# Patient Record
Sex: Female | Born: 1950 | Hispanic: No | Smoking: Never smoker
Health system: Southern US, Community
[De-identification: ages and names within clinical notes are randomized; demographics above are authoritative.]

## PROBLEM LIST (undated history)

## (undated) DIAGNOSIS — J45909 Unspecified asthma, uncomplicated: Secondary | ICD-10-CM

## (undated) DIAGNOSIS — R1310 Dysphagia, unspecified: Secondary | ICD-10-CM

## (undated) DIAGNOSIS — L409 Psoriasis, unspecified: Secondary | ICD-10-CM

## (undated) DIAGNOSIS — Z8619 Personal history of other infectious and parasitic diseases: Secondary | ICD-10-CM

## (undated) DIAGNOSIS — K219 Gastro-esophageal reflux disease without esophagitis: Secondary | ICD-10-CM

## (undated) DIAGNOSIS — M8580 Other specified disorders of bone density and structure, unspecified site: Secondary | ICD-10-CM

## (undated) DIAGNOSIS — E785 Hyperlipidemia, unspecified: Secondary | ICD-10-CM

## (undated) DIAGNOSIS — T7840XA Allergy, unspecified, initial encounter: Secondary | ICD-10-CM

## (undated) DIAGNOSIS — E559 Vitamin D deficiency, unspecified: Secondary | ICD-10-CM

## (undated) DIAGNOSIS — N6019 Diffuse cystic mastopathy of unspecified breast: Secondary | ICD-10-CM

## (undated) DIAGNOSIS — K449 Diaphragmatic hernia without obstruction or gangrene: Secondary | ICD-10-CM

## (undated) HISTORY — PX: TUBAL LIGATION: SHX77

## (undated) HISTORY — DX: Psoriasis, unspecified: L40.9

## (undated) HISTORY — PX: COLONOSCOPY: SHX174

## (undated) HISTORY — DX: Allergy, unspecified, initial encounter: T78.40XA

## (undated) HISTORY — DX: Dysphagia, unspecified: R13.10

## (undated) HISTORY — DX: Unspecified asthma, uncomplicated: J45.909

## (undated) HISTORY — DX: Gastro-esophageal reflux disease without esophagitis: K21.9

## (undated) HISTORY — DX: Other specified disorders of bone density and structure, unspecified site: M85.80

## (undated) HISTORY — DX: Vitamin D deficiency, unspecified: E55.9

## (undated) HISTORY — PX: TONSILLECTOMY: SUR1361

## (undated) HISTORY — DX: Diffuse cystic mastopathy of unspecified breast: N60.19

## (undated) HISTORY — DX: Hyperlipidemia, unspecified: E78.5

## (undated) HISTORY — DX: Diaphragmatic hernia without obstruction or gangrene: K44.9

## (undated) HISTORY — PX: UPPER GASTROINTESTINAL ENDOSCOPY: SHX188

## (undated) HISTORY — DX: Personal history of other infectious and parasitic diseases: Z86.19

---

## 2001-01-09 ENCOUNTER — Other Ambulatory Visit: Admission: RE | Admit: 2001-01-09 | Discharge: 2001-01-09 | Payer: Self-pay | Admitting: Family Medicine

## 2001-10-20 ENCOUNTER — Other Ambulatory Visit: Admission: RE | Admit: 2001-10-20 | Discharge: 2001-10-20 | Payer: Self-pay | Admitting: Family Medicine

## 2001-12-07 ENCOUNTER — Encounter: Admission: RE | Admit: 2001-12-07 | Discharge: 2001-12-07 | Payer: Self-pay | Admitting: Gastroenterology

## 2001-12-07 ENCOUNTER — Encounter: Payer: Self-pay | Admitting: Gastroenterology

## 2003-11-17 ENCOUNTER — Ambulatory Visit (HOSPITAL_COMMUNITY): Admission: RE | Admit: 2003-11-17 | Discharge: 2003-11-17 | Payer: Self-pay | Admitting: Gastroenterology

## 2004-01-27 ENCOUNTER — Other Ambulatory Visit: Admission: RE | Admit: 2004-01-27 | Discharge: 2004-01-27 | Payer: Self-pay | Admitting: Family Medicine

## 2005-06-17 ENCOUNTER — Other Ambulatory Visit: Admission: RE | Admit: 2005-06-17 | Discharge: 2005-06-17 | Payer: Self-pay | Admitting: Family Medicine

## 2006-10-21 ENCOUNTER — Other Ambulatory Visit: Admission: RE | Admit: 2006-10-21 | Discharge: 2006-10-21 | Payer: Self-pay | Admitting: Family Medicine

## 2006-10-29 ENCOUNTER — Ambulatory Visit: Payer: Self-pay | Admitting: Gastroenterology

## 2006-11-17 ENCOUNTER — Ambulatory Visit: Payer: Self-pay | Admitting: Gastroenterology

## 2012-11-06 ENCOUNTER — Ambulatory Visit: Payer: Self-pay | Admitting: Nurse Practitioner

## 2012-11-10 ENCOUNTER — Encounter: Payer: Self-pay | Admitting: Nurse Practitioner

## 2012-11-10 ENCOUNTER — Ambulatory Visit (INDEPENDENT_AMBULATORY_CARE_PROVIDER_SITE_OTHER): Payer: BC Managed Care – PPO | Admitting: Nurse Practitioner

## 2012-11-10 VITALS — BP 141/82 | HR 66 | Temp 98.1°F | Ht 62.5 in | Wt 197.0 lb

## 2012-11-10 DIAGNOSIS — R002 Palpitations: Secondary | ICD-10-CM

## 2012-11-10 DIAGNOSIS — Z01419 Encounter for gynecological examination (general) (routine) without abnormal findings: Secondary | ICD-10-CM

## 2012-11-10 DIAGNOSIS — L409 Psoriasis, unspecified: Secondary | ICD-10-CM | POA: Insufficient documentation

## 2012-11-10 DIAGNOSIS — Z0000 Encounter for general adult medical examination without abnormal findings: Secondary | ICD-10-CM

## 2012-11-10 LAB — POCT CBC
Granulocyte percent: 61.6 %G (ref 37–80)
HCT, POC: 40.6 % (ref 37.7–47.9)
Hemoglobin: 13.4 g/dL (ref 12.2–16.2)
Lymph, poc: 1.8 (ref 0.6–3.4)
MCH, POC: 27 pg (ref 27–31.2)
MCHC: 32.9 g/dL (ref 31.8–35.4)
MCV: 82.1 fL (ref 80–97)
MPV: 7.2 fL (ref 0–99.8)
POC Granulocyte: 3.6 (ref 2–6.9)
POC LYMPH PERCENT: 31.6 %L (ref 10–50)
Platelet Count, POC: 335 10*3/uL (ref 142–424)
RBC: 4.9 M/uL (ref 4.04–5.48)
RDW, POC: 13.6 %
WBC: 5.8 10*3/uL (ref 4.6–10.2)

## 2012-11-10 LAB — POCT URINALYSIS DIPSTICK
Bilirubin, UA: NEGATIVE
Glucose, UA: NEGATIVE
Ketones, UA: NEGATIVE
Leukocytes, UA: NEGATIVE
Nitrite, UA: NEGATIVE
Protein, UA: NEGATIVE
Spec Grav, UA: 1.02
Urobilinogen, UA: NEGATIVE
pH, UA: 5

## 2012-11-10 LAB — COMPLETE METABOLIC PANEL WITH GFR
ALT: 53 U/L — ABNORMAL HIGH (ref 0–35)
AST: 37 U/L (ref 0–37)
Albumin: 4.4 g/dL (ref 3.5–5.2)
Alkaline Phosphatase: 120 U/L — ABNORMAL HIGH (ref 39–117)
BUN: 8 mg/dL (ref 6–23)
CO2: 26 mEq/L (ref 19–32)
Calcium: 9.6 mg/dL (ref 8.4–10.5)
Chloride: 104 mEq/L (ref 96–112)
Creat: 0.57 mg/dL (ref 0.50–1.10)
GFR, Est African American: >89 mL/min
GFR, Est Non African American: >89 mL/min
Glucose, Bld: 92 mg/dL (ref 70–99)
Potassium: 4.2 mEq/L (ref 3.5–5.3)
Sodium: 140 mEq/L (ref 135–145)
Total Bilirubin: 0.4 mg/dL (ref 0.3–1.2)
Total Protein: 7.2 g/dL (ref 6.0–8.3)

## 2012-11-10 LAB — POCT UA - MICROSCOPIC ONLY
Crystals, Ur, HPF, POC: NEGATIVE
Yeast, UA: NEGATIVE

## 2012-11-10 LAB — THYROID PANEL WITH TSH: T4, Total: 9.1 ug/dL (ref 5.0–12.5)

## 2012-11-10 MED ORDER — PANTOPRAZOLE SODIUM 40 MG PO TBEC: 40.0000 mg | DELAYED_RELEASE_TABLET | Freq: Every day | ORAL | Status: DC

## 2012-11-10 MED ORDER — CLOBETASOL PROPIONATE 0.05 % EX FOAM: Freq: Two times a day (BID) | CUTANEOUS | Status: DC

## 2012-11-10 NOTE — Addendum Note (Signed)
Addended by: Bennie Pierini on: 11/10/2012 05:25 PM   Modules accepted: Orders, Medications

## 2012-11-10 NOTE — Progress Notes (Signed)
Subjective:    Patient ID: Angela Curry, female    DOB: 03/20/1951, 62 y.o.   MRN: [id number]  HPI  Patient here today for CPE and PAP. She is doing well.  * Patient c/o more frquent palpitations lasting 15-30 seconds at most. Occur 2-3 X per week. Some SOB but only when occurring.   Strong Family Hx of heart disease. No Known Allergies  Outpatient Encounter Prescriptions as of 11/10/2012  Medication Sig Dispense Refill  . clobetasol (OLUX) 0.05 % topical foam Apply topically 2 (two) times daily.      Marland Kitchen lubiprostone (AMITIZA) 24 MCG capsule Take 24 mcg by mouth 2 (two) times daily with a meal.      . RABEprazole (ACIPHEX) 20 MG tablet Take 20 mg by mouth daily.      . valACYclovir (VALTREX) 1000 MG tablet Take 1,000 mg by mouth 2 (two) times daily.       No facility-administered encounter medications on file as of 11/10/2012.    Past Medical History  Diagnosis Date  . Psoriasis   . Osteopenia   . Fibrocystic breast disease   . H/O cold sores   . Hiatal hernia   . Dysphagia   . Vitamin D deficiency     Past Surgical History  Procedure Laterality Date  . Tubal ligation    . Tonsillectomy      History   Social History  . Marital Status: Divorced    Spouse Name: N/A    Number of Children: N/A  . Years of Education: N/A   Occupational History  . Not on file.   Social History Main Topics  . Smoking status: Never Smoker   . Smokeless tobacco: Not on file  . Alcohol Use: Yes  . Drug Use: No  . Sexually Active: Not on file   Other Topics Concern  . Not on file   Social History Narrative  . No narrative on file         Review of Systems  Constitutional: Negative.   HENT: Negative.   Eyes: Negative.   Respiratory: Negative.   Cardiovascular: Negative.   Gastrointestinal: Negative.   Endocrine: Negative.   Genitourinary: Negative.   Musculoskeletal: Negative.   Allergic/Immunologic: Negative.   Neurological: Negative.   Hematological: Negative.    Psychiatric/Behavioral: Negative.        Objective:   Physical Exam  Constitutional: She is oriented to person, place, and time. She appears well-developed and well-nourished.  HENT:  Head: Normocephalic.  Right Ear: Hearing, tympanic membrane, external ear and ear canal normal.  Left Ear: Hearing, tympanic membrane, external ear and ear canal normal.  Nose: Nose normal.  Mouth/Throat: Uvula is midline and oropharynx is clear and moist.  Eyes: Conjunctivae and EOM are normal. Pupils are equal, round, and reactive to light.  Neck: Normal range of motion and full passive range of motion without pain. Neck supple. No JVD present. Carotid bruit is not present. No mass and no thyromegaly present.  Cardiovascular: Normal rate, normal heart sounds and intact distal pulses.   No murmur heard. Pulmonary/Chest: Effort normal and breath sounds normal. Right breast exhibits no inverted nipple, no mass, no nipple discharge, no skin change and no tenderness. Left breast exhibits no inverted nipple, no mass, no nipple discharge, no skin change and no tenderness.  Abdominal: Soft. Bowel sounds are normal. She exhibits no mass. There is no tenderness.  Genitourinary: Vagina normal and uterus normal. No breast swelling, tenderness, discharge or bleeding.  bimanual exam-No adnexal masses or tenderness. Cervix parous and pink no discharge   Musculoskeletal: Normal range of motion.  Lymphadenopathy:    She has no cervical adenopathy.  Neurological: She is alert and oriented to person, place, and time.  Skin: Skin is warm and dry.  Psychiatric: She has a normal mood and affect. Her behavior is normal. Judgment and thought content normal.   BP 141/82  Pulse 66  Temp(Src) 98.1 F (36.7 C) (Oral)  Ht 5' 2.5" (1.588 m)  Wt 197 lb (89.359 kg)  BMI 35.44 kg/m2        Assessment & Plan:  1. Annual physical exam  - POCT urinalysis dipstick - POCT UA - Microscopic Only - POCT CBC - COMPLETE  METABOLIC PANEL WITH GFR - NMR Lipoprofile with Lipids - Thyroid Panel With TSH  2. Psoriasis   3. Encounter for routine gynecological examination  - Pap IG, CT/NG w/ reflex HPV when ASC-U  4. Palpitations Avoid caffeine - Ambulatory referral to Cardiology  Mary-Margaret Daphine Deutscher, FNP

## 2012-11-10 NOTE — Patient Instructions (Signed)
Palpitations  A palpitation is the feeling that your heartbeat is irregular or is faster than normal. It may feel like your heart is fluttering or skipping a beat. Palpitations are usually not a serious problem. However, in some cases, you may need further medical evaluation. CAUSES  Palpitations can be caused by:  Smoking.  Caffeine or other stimulants, such as diet pills or energy drinks.  Alcohol.  Stress and anxiety.  Strenuous physical activity.  Fatigue.  Certain medicines.  Heart disease, especially if you have a history of arrhythmias. This includes atrial fibrillation, atrial flutter, or supraventricular tachycardia.  An improperly working pacemaker or defibrillator. DIAGNOSIS  To find the cause of your palpitations, your caregiver will take your history and perform a physical exam. Tests may also be done, including:  Electrocardiography (ECG). This test records the heart's electrical activity.  Cardiac monitoring. This allows your caregiver to monitor your heart rate and rhythm in real time.  Holter monitor. This is a portable device that records your heartbeat and can help diagnose heart arrhythmias. It allows your caregiver to track your heart activity for several days, if needed.  Stress tests by exercise or by giving medicine that makes the heart beat faster. TREATMENT  Treatment of palpitations depends on the cause of your symptoms and can vary greatly. Most cases of palpitations do not require any treatment other than time, relaxation, and monitoring your symptoms. Other causes, such as atrial fibrillation, atrial flutter, or supraventricular tachycardia, usually require further treatment. HOME CARE INSTRUCTIONS   Avoid:  Caffeinated coffee, tea, soft drinks, diet pills, and energy drinks.  Chocolate.  Alcohol.  Stop smoking if you smoke.  Reduce your stress and anxiety. Things that can help you relax include:  A method that measures bodily functions so  you can learn to control them (biofeedback).  Yoga.  Meditation.  Physical activity such as swimming, jogging, or walking.  Get plenty of rest and sleep. SEEK MEDICAL CARE IF:   You continue to have a fast or irregular heartbeat beyond 24 hours.  Your palpitations occur more often. SEEK IMMEDIATE MEDICAL CARE IF:  You develop chest pain or shortness of breath.  You have a severe headache.  You feel dizzy, or you faint. MAKE SURE YOU:  Understand these instructions.  Will watch your condition.  Will get help right away if you are not doing well or get worse. Document Released: 06/28/2000 Document Revised: 12/31/2011 Document Reviewed: 08/30/2011 ExitCare Patient Information 2013 [employer].  

## 2012-11-11 ENCOUNTER — Ambulatory Visit (INDEPENDENT_AMBULATORY_CARE_PROVIDER_SITE_OTHER): Payer: BC Managed Care – PPO | Admitting: Family Medicine

## 2012-11-11 DIAGNOSIS — Z2911 Encounter for prophylactic immunotherapy for respiratory syncytial virus (RSV): Secondary | ICD-10-CM

## 2012-11-11 DIAGNOSIS — Z299 Encounter for prophylactic measures, unspecified: Secondary | ICD-10-CM

## 2012-11-11 LAB — NMR LIPOPROFILE WITH LIPIDS
HDL Size: 8.8 nm — ABNORMAL LOW (ref >=9.2)
HDL-C: 54 mg/dL (ref >=40)
LDL Particle Number: 2501 nmol/L — ABNORMAL HIGH (ref <1000)
LDL Size: 20.8 nm (ref >20.5)
Large HDL-P: 5 umol/L (ref >=4.8)
VLDL Size: 46.3 nm (ref <=46.6)

## 2012-11-11 NOTE — Patient Instructions (Signed)
Shingles Vaccine What You Need to Know WHAT IS SHINGLES?  Shingles is a painful skin rash, often with blisters. It is also called Herpes Zoster or just Zoster.  A shingles rash usually appears on one side of the face or body and lasts from 2 to 4 weeks. Its main symptom is pain, which can be quite severe. Other symptoms of shingles can include fever, headache, chills, and upset stomach. Very rarely, a shingles infection can lead to pneumonia, hearing problems, blindness, brain inflammation (encephalitis), or death.  For about 1 person in 5, severe pain can continue even after the rash clears up. This is called post-herpetic neuralgia.  Shingles is caused by the Varicella Zoster virus. This is the same virus that causes chickenpox. Only someone who has had a case of chickenpox or rarely, has gotten chickenpox vaccine, can get shingles. The virus stays in your body. It can reappear many years later to cause a case of shingles.  You cannot catch shingles from another person with shingles. However, a person who has never had chickenpox (or chickenpox vaccine) could get chickenpox from someone with shingles. This is not very common.  Shingles is far more common in people 50 and older than in younger people. It is also more common in people whose immune systems are weakened because of a disease such as cancer or drugs such as steroids or chemotherapy.  At least 1 million people get shingles per year in the [place]. SHINGLES VACCINE  A vaccine for shingles was licensed in 2006. In clinical trials, the vaccine reduced the risk of shingles by 50%. It can also reduce the pain in people who still get shingles after being vaccinated.  A single dose of shingles vaccine is recommended for adults 60 years of age and older. SOME PEOPLE SHOULD NOT GET SHINGLES VACCINE OR SHOULD WAIT A person should not get shingles vaccine if he or she:  Has ever had a life-threatening allergic reaction to gelatin, the  antibiotic neomycin, or any other component of shingles vaccine. Tell your caregiver if you have any severe allergies.  Has a weakened immune system because of current:  AIDS or another disease that affects the immune system.  Treatment with drugs that affect the immune system, such as prolonged use of high-dose steroids.  Cancer treatment, such as radiation or chemotherapy.  Cancer affecting the bone marrow or lymphatic system, such as leukemia or lymphoma.  Is pregnant, or might be pregnant. Women should not become pregnant until at least 4 weeks after getting shingles vaccine. Someone with a minor illness, such as a cold, may be vaccinated. Anyone with a moderate or severe acute illness should usually wait until he or she recovers before getting the vaccine. This includes anyone with a temperature of 101.3 F (38 C) or higher. WHAT ARE THE RISKS FROM SHINGLES VACCINE?  A vaccine, like any medicine, could possibly cause serious problems, such as severe allergic reactions. However, the risk of a vaccine causing serious harm, or death, is extremely small.  No serious problems have been identified with shingles vaccine. Mild Problems  Redness, soreness, swelling, or itching at the site of the injection (about 1 person in 3).  Headache (about 1 person in 70). Like all vaccines, shingles vaccine is being closely monitored for unusual or severe problems. WHAT IF THERE IS A MODERATE OR SEVERE REACTION? What should I look for? Any unusual condition, such as a severe allergic reaction or a high fever. If a severe allergic reaction   occurred, it would be within a few minutes to an hour after the shot. Signs of a serious allergic reaction can include difficulty breathing, weakness, hoarseness or wheezing, a fast heartbeat, hives, dizziness, paleness, or swelling of the throat. What should I do?  Call your caregiver, or get the person to a caregiver right away.  Tell the caregiver what  happened, the date and time it happened, and when the vaccination was given.  Ask the caregiver to report the reaction by filing a Vaccine Adverse Event Reporting System (VAERS) form. Or, you can file this report through the VAERS web site at www.vaers.hhs.gov or by calling 1-800-822-7967. VAERS does not provide medical advice. HOW CAN I LEARN MORE?  Ask your caregiver. He or she can give you the vaccine package insert or suggest other sources of information.  Contact the Centers for Disease Control and Prevention (CDC):  Call 1-800-232-4636 (1-800-CDC-INFO).  Visit the CDC website at www.cdc.gov/vaccines CDC Shingles Vaccine VIS (04/19/08) Document Released: 04/28/2006 Document Revised: 09/23/2011 Document Reviewed: 04/19/2008 ExitCare Patient Information 2013 [employer].  

## 2012-11-11 NOTE — Progress Notes (Deleted)
Subjective:     Patient ID: Angela Curry, female   DOB: 10/13/1950, 62 y.o.   MRN: [id number]  HPI   Review of Systems     Objective:   Physical Exam     Assessment:     ***    Plan:     ***     PT GIVEN ZOSTAVAX AND TOLERATED WELL

## 2012-11-12 NOTE — Progress Notes (Signed)
Pt given zostavax and tolerated well.

## 2012-11-12 NOTE — Addendum Note (Signed)
Addended by: Orma Render F on: 11/12/2012 09:06 AM   Modules accepted: Orders

## 2012-11-13 LAB — PAP, THINPREP RFLX HPV WITH CT/GC
Chlamydia Probe Amp: NEGATIVE
GC Probe Amp: NEGATIVE

## 2012-12-08 ENCOUNTER — Ambulatory Visit: Payer: Self-pay | Admitting: Cardiovascular Disease

## 2012-12-30 ENCOUNTER — Encounter: Payer: Self-pay | Admitting: Cardiovascular Disease

## 2012-12-30 ENCOUNTER — Encounter: Payer: Self-pay | Admitting: *Deleted

## 2012-12-30 DIAGNOSIS — M8580 Other specified disorders of bone density and structure, unspecified site: Secondary | ICD-10-CM | POA: Insufficient documentation

## 2012-12-30 DIAGNOSIS — N6019 Diffuse cystic mastopathy of unspecified breast: Secondary | ICD-10-CM | POA: Insufficient documentation

## 2012-12-30 DIAGNOSIS — E559 Vitamin D deficiency, unspecified: Secondary | ICD-10-CM | POA: Insufficient documentation

## 2012-12-30 DIAGNOSIS — K449 Diaphragmatic hernia without obstruction or gangrene: Secondary | ICD-10-CM | POA: Insufficient documentation

## 2012-12-30 DIAGNOSIS — R1310 Dysphagia, unspecified: Secondary | ICD-10-CM | POA: Insufficient documentation

## 2013-01-04 ENCOUNTER — Ambulatory Visit (INDEPENDENT_AMBULATORY_CARE_PROVIDER_SITE_OTHER): Payer: Self-pay | Admitting: Cardiovascular Disease

## 2013-01-04 ENCOUNTER — Encounter: Payer: Self-pay | Admitting: Family Medicine

## 2013-01-04 ENCOUNTER — Telehealth: Payer: Self-pay | Admitting: Nurse Practitioner

## 2013-01-04 ENCOUNTER — Ambulatory Visit (INDEPENDENT_AMBULATORY_CARE_PROVIDER_SITE_OTHER): Payer: BC Managed Care – PPO | Admitting: Family Medicine

## 2013-01-04 ENCOUNTER — Encounter: Payer: Self-pay | Admitting: Cardiovascular Disease

## 2013-01-04 VITALS — BP 128/88 | HR 85 | Ht 62.5 in | Wt 196.1 lb

## 2013-01-04 VITALS — BP 139/90 | HR 78 | Temp 98.7°F | Ht 62.5 in | Wt 198.7 lb

## 2013-01-04 DIAGNOSIS — R0989 Other specified symptoms and signs involving the circulatory and respiratory systems: Secondary | ICD-10-CM

## 2013-01-04 DIAGNOSIS — R609 Edema, unspecified: Secondary | ICD-10-CM

## 2013-01-04 DIAGNOSIS — M549 Dorsalgia, unspecified: Secondary | ICD-10-CM

## 2013-01-04 DIAGNOSIS — R002 Palpitations: Secondary | ICD-10-CM

## 2013-01-04 DIAGNOSIS — E785 Hyperlipidemia, unspecified: Secondary | ICD-10-CM | POA: Insufficient documentation

## 2013-01-04 DIAGNOSIS — R0609 Other forms of dyspnea: Secondary | ICD-10-CM

## 2013-01-04 DIAGNOSIS — E7800 Pure hypercholesterolemia, unspecified: Secondary | ICD-10-CM

## 2013-01-04 DIAGNOSIS — R0600 Dyspnea, unspecified: Secondary | ICD-10-CM

## 2013-01-04 DIAGNOSIS — K219 Gastro-esophageal reflux disease without esophagitis: Secondary | ICD-10-CM

## 2013-01-04 MED ORDER — METHYLPREDNISOLONE 4 MG PO KIT: PACK | ORAL | Status: DC

## 2013-01-04 MED ORDER — HYDROCODONE-ACETAMINOPHEN 5-325 MG PO TABS: 1.0000 | ORAL_TABLET | Freq: Four times a day (QID) | ORAL | Status: DC | PRN

## 2013-01-04 MED ORDER — NAPROXEN 500 MG PO TABS: 500.0000 mg | ORAL_TABLET | Freq: Two times a day (BID) | ORAL | Status: DC

## 2013-01-04 NOTE — Assessment & Plan Note (Signed)
Low sodium diet Likely venous disease with varicosities She does not seem bothered by it

## 2013-01-04 NOTE — Assessment & Plan Note (Signed)
Benign sounding and improved since visit with primary Given demographic and CRF;s will order stress echo to r/o exercise induced arrhythmia and CAD.  Echo imaging for mild exertional dyspnea to r/o structural heart disease

## 2013-01-04 NOTE — Telephone Encounter (Signed)
Ppt made.  

## 2013-01-04 NOTE — Assessment & Plan Note (Signed)
See note by primary Recommend calcium score for risk statification and to further see how close cholesterol and particle size needs to be watched and Rx

## 2013-01-04 NOTE — Progress Notes (Signed)
  Subjective:    Patient ID: Angela Curry, female    DOB: Jan 28, 1951, 62 y.o.   MRN: [id number]  HPI This 62 y.o. female presents for evaluation of back pain.  She states she twisted her back yesterday and now has severe pain in her right back radiating down he right leg to her knee.  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  She states she has never injured her back and wants to get out of pain.   Review of Systems   C/o back, No chest pain, SOB, HA, dizziness, vision change, N/V, diarrhea, constipation, dysuria, urinary urgency or frequency, myalgias, or rash.  Objective:   Physical Exam Vital signs noted  Well developed well nourished female.  HEENT - Head atraumatic Normocephalic                Eyes - PERRLA, Conjuctiva - clear Sclera- Clear EOMI                Ears - EAC's Wnl TM's Wnl Gross Hearing WNL                Nose - Nares patent                 Throat - oropharanx wnl Respiratory - Lungs CTA bilateral Cardiac - RRR S1 and S2 without murmur MS- TTP right LS spine. Extremities - No edema. Neuro - Grossly intact.       Assessment & Plan:  Back pain - Plan: naproxen (NAPROSYN) 500 MG tablet, HDROcodone-acetaminophen (NORCO) 5-325 MG per tablet, methylPREDNISolone (MEDROL, PAK,) 4 MG tablet,  Work note, and follow up in one week if not better.  Instruction for home are to be in position of comfort and also warned patient of possible GI side effects of naprosyn and medrol dose pack.    GERD - Continue Aciphex 20mg  and if GERD sx's worsen increase to BID and consider using zantac while using naprosyn and medrol dose pack.

## 2013-01-04 NOTE — Progress Notes (Signed)
Patient ID: Angela Curry, female   DOB: 01-07-1951, 62 y.o.   MRN: [id number] Referred by [facility] for palpitations Patient c/o more frquent palpitations lasting 15-30 seconds at most. Occur 2-3 X per week. Some SOB but only when occurring. Strong Family Hx of heart disease. TSH normal 4/14  CRF other than family history is high LDL particle size Rx with diet  She is a full blooded [employer] [place] and there is a high prevalence of CAD int this demographic Palpitations have improved over the last 6 weeks. They were not exertional. Flip flops and no rapic sustatined rhythm.  Fairly sedentary with mild exertional dyspnea. Has LE varicosities with trace edema  ROS: Denies fever, malais, weight loss, blurry vision, decreased visual acuity, cough, sputum, SOB, hemoptysis, pleuritic pain, palpitaitons, heartburn, abdominal pain, melena, lower extremity edema, claudication, or rash.  All other systems reviewed and negative   General: Affect appropriate Overweight white female HEENT: normal Roasacea Neck supple with no adenopathy JVP normal no bruits no thyromegaly Lungs clear with no wheezing and good diaphragmatic motion Heart:  S1/S2 no murmur,rub, gallop or click PMI normal Abdomen: benighn, BS positve, no tenderness, no AAA no bruit.  No HSM or HJR Distal pulses intact with no bruits Trace bilateral  Edema with varicose veins Neuro non-focal Skin warm and dry No muscular weakness  Medications Current Outpatient Prescriptions  Medication Sig Dispense Refill  . clobetasol (OLUX) 0.05 % topical foam Apply topically 2 (two) times daily.  50 g  2  . lubiprostone (AMITIZA) 24 MCG capsule Take 24 mcg by mouth 2 (two) times daily with a meal.      . RABEprazole (ACIPHEX) 20 MG tablet Take 20 mg by mouth daily.      . valACYclovir (VALTREX) 1000 MG tablet Take 1,000 mg by mouth 2 (two) times daily.       No current facility-administered medications for this visit.    Allergies Review of  patient's allergies indicates no known allergies.  Family History: Family History  Problem Relation Age of Onset  . Diabetes Mother   . Heart disease Father 52  . Drug abuse Father   . Early death Father   . Cancer Paternal Aunt     breast  . Cancer Paternal Aunt     breast    Social History: History   Social History  . Marital Status: Divorced    Spouse Name: N/A    Number of Children: N/A  . Years of Education: N/A   Occupational History  . Not on file.   Social History Main Topics  . Smoking status: Never Smoker   . Smokeless tobacco: Not on file  . Alcohol Use: Yes  . Drug Use: No  . Sexually Active: Not on file   Other Topics Concern  . Not on file   Social History Narrative  . No narrative on file    Electrocardiogram:NSR rate 85 normal ECG  Assessment and Plan

## 2013-01-04 NOTE — Patient Instructions (Signed)
Back Pain, Adult  Low back pain is very common. About 1 in 5 people have back pain. The cause of low back pain is rarely dangerous. The pain often gets better over time. About half of people with a sudden onset of back pain feel better in just 2 weeks. About 8 in 10 people feel better by 6 weeks.   CAUSES  Some common causes of back pain include:  · Strain of the muscles or ligaments supporting the spine.  · Wear and tear (degeneration) of the spinal discs.  · Arthritis.  · Direct injury to the back.  DIAGNOSIS  Most of the time, the direct cause of low back pain is not known. However, back pain can be treated effectively even when the exact cause of the pain is unknown. Answering your caregiver's questions about your overall health and symptoms is one of the most accurate ways to make sure the cause of your pain is not dangerous. If your caregiver needs more information, he or she may order lab work or imaging tests (X-rays or MRIs). However, even if imaging tests show changes in your back, this usually does not require surgery.  HOME CARE INSTRUCTIONS  For many people, back pain returns. Since low back pain is rarely dangerous, it is often a condition that people can learn to manage on their own.   · Remain active. It is stressful on the back to sit or stand in one place. Do not sit, drive, or stand in one place for more than 30 minutes at a time. Take short walks on level surfaces as soon as pain allows. Try to increase the length of time you walk each day.  · Do not stay in bed. Resting more than 1 or 2 days can delay your recovery.  · Do not avoid exercise or work. Your body is made to move. It is not dangerous to be active, even though your back may hurt. Your back will likely heal faster if you return to being active before your pain is gone.  · Pay attention to your body when you  bend and lift. Many people have less discomfort when lifting if they bend their knees, keep the load close to their bodies, and  avoid twisting. Often, the most comfortable positions are those that put less stress on your recovering back.  · Find a comfortable position to sleep. Use a firm mattress and lie on your side with your knees slightly bent. If you lie on your back, put a pillow under your knees.  · Only take over-the-counter or prescription medicines as directed by your caregiver. Over-the-counter medicines to reduce pain and inflammation are often the most helpful. Your caregiver may prescribe muscle relaxant drugs. These medicines help dull your pain so you can more quickly return to your normal activities and healthy exercise.  · Put ice on the injured area.  · Put ice in a plastic bag.  · Place a towel between your skin and the bag.  · Leave the ice on for 15-20 minutes, 3-4 times a day for the first 2 to 3 days. After that, ice and heat may be alternated to reduce pain and spasms.  · Ask your caregiver about trying back exercises and gentle massage. This may be of some benefit.  · Avoid feeling anxious or stressed. Stress increases muscle tension and can worsen back pain. It is important to recognize when you are anxious or stressed and learn ways to manage it. Exercise is a great option.  SEEK MEDICAL CARE IF:  · You have pain that is not relieved with rest or   medicine.  · You have pain that does not improve in 1 week.  · You have new symptoms.  · You are generally not feeling well.  SEEK IMMEDIATE MEDICAL CARE IF:   · You have pain that radiates from your back into your legs.  · You develop new bowel or bladder control problems.  · You have unusual weakness or numbness in your arms or legs.  · You develop nausea or vomiting.  · You develop abdominal pain.  · You feel faint.  Document Released: 07/01/2005 Document Revised: 12/31/2011 Document Reviewed: 11/19/2010  ExitCare® Patient Information ©2014 [employer].

## 2013-01-04 NOTE — Patient Instructions (Addendum)
Your physician recommends that you schedule a follow-up appointment in: AS NEEDED  Your physician recommends that you continue on your current medications as directed. Please refer to the Current Medication list given to you today.  Your physician has requested that you have a stress echocardiogram. For further information please visit [email or web address] Please follow instruction sheet as given.  CALCIUM SCORE MAY CALL  BACK TO SCHEDULE  OUT OF POCKET EXPENSE OF A 150.OO   ALSO  MAY CALL BACK IF  HAS MORE PALPITATIONS  TO GET MONITOR

## 2013-01-08 ENCOUNTER — Other Ambulatory Visit: Payer: Self-pay | Admitting: *Deleted

## 2013-01-08 MED ORDER — RABEPRAZOLE SODIUM 20 MG PO TBEC: 20.0000 mg | DELAYED_RELEASE_TABLET | Freq: Every day | ORAL | Status: DC

## 2013-01-26 ENCOUNTER — Other Ambulatory Visit (HOSPITAL_COMMUNITY): Payer: BC Managed Care – PPO

## 2013-02-17 ENCOUNTER — Other Ambulatory Visit (HOSPITAL_COMMUNITY): Payer: BC Managed Care – PPO

## 2013-02-22 ENCOUNTER — Other Ambulatory Visit: Payer: Self-pay | Admitting: Nurse Practitioner

## 2013-02-23 ENCOUNTER — Ambulatory Visit (HOSPITAL_COMMUNITY): Payer: BC Managed Care – PPO | Attending: Cardiovascular Disease

## 2013-02-23 ENCOUNTER — Other Ambulatory Visit: Payer: Self-pay | Admitting: *Deleted

## 2013-02-23 DIAGNOSIS — R0602 Shortness of breath: Secondary | ICD-10-CM

## 2013-02-23 DIAGNOSIS — R002 Palpitations: Secondary | ICD-10-CM | POA: Insufficient documentation

## 2013-02-23 DIAGNOSIS — R0989 Other specified symptoms and signs involving the circulatory and respiratory systems: Secondary | ICD-10-CM | POA: Insufficient documentation

## 2013-02-23 DIAGNOSIS — Z8249 Family history of ischemic heart disease and other diseases of the circulatory system: Secondary | ICD-10-CM | POA: Insufficient documentation

## 2013-02-23 DIAGNOSIS — R0609 Other forms of dyspnea: Secondary | ICD-10-CM | POA: Insufficient documentation

## 2013-02-23 DIAGNOSIS — R0600 Dyspnea, unspecified: Secondary | ICD-10-CM

## 2013-02-23 DIAGNOSIS — R609 Edema, unspecified: Secondary | ICD-10-CM | POA: Insufficient documentation

## 2013-02-23 NOTE — Progress Notes (Signed)
Echocardiogram performed.  

## 2013-02-23 NOTE — Progress Notes (Signed)
Stress echocardiogram performed. 

## 2013-02-25 ENCOUNTER — Telehealth: Payer: Self-pay | Admitting: Cardiovascular Disease

## 2013-02-25 ENCOUNTER — Encounter: Payer: Self-pay | Admitting: Cardiovascular Disease

## 2013-02-25 NOTE — Telephone Encounter (Signed)
Follow up  Pt is returning call regarding echo results.

## 2013-02-25 NOTE — Progress Notes (Signed)
Quick Note:  Provided results to patient by phone. Patient verbalized understanding and appreciation. ______ 

## 2013-02-25 NOTE — Telephone Encounter (Signed)
Provided results to patient by phone. Patient verbalized understanding and appreciation.

## 2013-02-25 NOTE — Progress Notes (Signed)
Quick Note:  Provided results to patient by phone. Patient verbalized understanding and appreciation. ______

## 2013-03-02 ENCOUNTER — Telehealth: Payer: Self-pay | Admitting: Nurse Practitioner

## 2013-03-03 ENCOUNTER — Encounter: Payer: Self-pay | Admitting: General Practice

## 2013-03-03 ENCOUNTER — Ambulatory Visit (INDEPENDENT_AMBULATORY_CARE_PROVIDER_SITE_OTHER): Payer: BC Managed Care – PPO

## 2013-03-03 ENCOUNTER — Ambulatory Visit (INDEPENDENT_AMBULATORY_CARE_PROVIDER_SITE_OTHER): Payer: BC Managed Care – PPO | Admitting: General Practice

## 2013-03-03 VITALS — BP 129/88 | HR 90 | Temp 97.8°F | Ht 62.5 in | Wt 191.0 lb

## 2013-03-03 DIAGNOSIS — R509 Fever, unspecified: Secondary | ICD-10-CM

## 2013-03-03 DIAGNOSIS — R109 Unspecified abdominal pain: Secondary | ICD-10-CM

## 2013-03-03 LAB — POCT CBC
Granulocyte percent: 75.9 %G (ref 37–80)
HCT, POC: 41.6 % (ref 37.7–47.9)
Hemoglobin: 13.5 g/dL (ref 12.2–16.2)
MPV: 7.5 fL (ref 0–99.8)
POC Granulocyte: 7.7 — AB (ref 2–6.9)
POC LYMPH PERCENT: 19.1 %L (ref 10–50)
RBC: 5 M/uL (ref 4.04–5.48)

## 2013-03-03 NOTE — Telephone Encounter (Signed)
Please follow up

## 2013-03-03 NOTE — Progress Notes (Signed)
  Subjective:    Patient ID: Angela Curry, female    DOB: 1950/09/22, 62 y.o.   MRN: [id number]  Constipation This is a new problem. The current episode started in the past 7 days (last bowel movement on Sunday). The problem has been gradually worsening since onset. The patient is not on a high fiber diet. She does not exercise regularly. There has not been adequate water intake. Associated symptoms include abdominal pain, bloating and vomiting. Pertinent negatives include no back pain, difficulty urinating, fever or hematochezia. She has tried laxatives for the symptoms.      Review of Systems  Constitutional: Negative for fever and chills.  Respiratory: Negative for chest tightness and shortness of breath.   Cardiovascular: Negative for chest pain and palpitations.  Gastrointestinal: Positive for vomiting, abdominal pain, constipation and bloating. Negative for blood in stool and hematochezia.  Genitourinary: Negative for dysuria and difficulty urinating.  Musculoskeletal: Negative for back pain.  Neurological: Negative for dizziness, weakness and headaches.       Objective:   Physical Exam  Constitutional: She is oriented to person, place, and time. She appears well-developed and well-nourished.  Cardiovascular: Normal rate, regular rhythm and normal heart sounds.   Pulmonary/Chest: Effort normal and breath sounds normal. No respiratory distress. She exhibits no tenderness.  Abdominal: Soft. Bowel sounds are normal. She exhibits no distension. There is tenderness in the right lower quadrant and left lower quadrant. There is no CVA tenderness. No hernia.  Neurological: She is alert and oriented to person, place, and time.  Skin: Skin is warm and dry.  Psychiatric: She has a normal mood and affect.          Assessment & Plan:  1. Fever - POCT CBC  2. Abdominal  pain, other specified site - POCT CBC - DG Abd 1 View; Future -Miralax 17grams daily, for 1-4 days, until  bowel movement  Increase fluid intake (water) Increase fiber in diet (fruits, vegetables, whole grains) Take stool softner daily RTO if symptoms worsen, no bowel movement in 24 hours, or seek emergency medical treatment Patient verbalized understanding Coralie Keens, FNP-C

## 2013-03-03 NOTE — Patient Instructions (Addendum)
Abdominal Pain Abdominal pain can be caused by many things. Your caregiver decides the seriousness of your pain by an examination and possibly blood tests and X-rays. Many cases can be observed and treated at home. Most abdominal pain is not caused by a disease and will probably improve without treatment. However, in many cases, more time must pass before a clear cause of the pain can be found. Before that point, it may not be known if you need more testing, or if hospitalization or surgery is needed. HOME CARE INSTRUCTIONS   Do not take laxatives unless directed by your caregiver.  Take pain medicine only as directed by your caregiver.  Only take over-the-counter or prescription medicines for pain, discomfort, or fever as directed by your caregiver.  Try a clear liquid diet (broth, tea, or water) for as long as directed by your caregiver. Slowly move to a bland diet as tolerated. SEEK IMMEDIATE MEDICAL CARE IF:   The pain does not go away.  You have a fever.  You keep throwing up (vomiting).  The pain is felt only in portions of the abdomen. Pain in the right side could possibly be appendicitis. In an adult, pain in the left lower portion of the abdomen could be colitis or diverticulitis.  You pass bloody or black tarry stools. MAKE SURE YOU:   Understand these instructions.  Will watch your condition.  Will get help right away if you are not doing well or get worse. Document Released: 04/10/2005 Document Revised: 09/23/2011 Document Reviewed: 02/17/2008 [facility] Patient Information 2014 [place], [place].  Miralax 17grams daily, for 1-4 days, until bowel movement  Increase fluid intake (water) Increase fiber in diet (fruits, vegetables, whole grains) Take stool softner daily

## 2013-03-04 NOTE — Telephone Encounter (Signed)
Came in and saw Angela Curry 8/20

## 2013-04-24 ENCOUNTER — Other Ambulatory Visit: Payer: Self-pay | Admitting: Nurse Practitioner

## 2013-04-24 ENCOUNTER — Other Ambulatory Visit: Payer: Self-pay | Admitting: Family Medicine

## 2013-04-27 ENCOUNTER — Other Ambulatory Visit: Payer: Self-pay

## 2013-04-27 MED ORDER — CONJ ESTROG-MEDROXYPROGEST ACE 0.3-1.5 MG PO TABS: 1.0000 | ORAL_TABLET | Freq: Every day | ORAL | Status: DC

## 2013-05-20 ENCOUNTER — Other Ambulatory Visit: Payer: Self-pay

## 2013-07-06 ENCOUNTER — Other Ambulatory Visit: Payer: Self-pay | Admitting: Nurse Practitioner

## 2013-08-26 ENCOUNTER — Other Ambulatory Visit: Payer: Self-pay | Admitting: General Practice

## 2013-08-28 ENCOUNTER — Other Ambulatory Visit: Payer: Self-pay | Admitting: Nurse Practitioner

## 2013-09-08 ENCOUNTER — Encounter: Payer: Self-pay | Admitting: Family Medicine

## 2013-09-08 ENCOUNTER — Other Ambulatory Visit: Payer: Self-pay | Admitting: General Practice

## 2013-09-08 ENCOUNTER — Ambulatory Visit (INDEPENDENT_AMBULATORY_CARE_PROVIDER_SITE_OTHER): Payer: BC Managed Care – PPO | Admitting: Family Medicine

## 2013-09-08 VITALS — BP 126/77 | HR 91 | Temp 99.3°F | Ht 62.5 in | Wt 202.0 lb

## 2013-09-08 DIAGNOSIS — J069 Acute upper respiratory infection, unspecified: Secondary | ICD-10-CM

## 2013-09-08 DIAGNOSIS — R062 Wheezing: Secondary | ICD-10-CM

## 2013-09-08 MED ORDER — AZITHROMYCIN 250 MG PO TABS: ORAL_TABLET | ORAL | Status: DC

## 2013-09-08 MED ORDER — METHYLPREDNISOLONE ACETATE 80 MG/ML IJ SUSP
80.0000 mg | Freq: Once | INTRAMUSCULAR | Status: AC
  Administered 2013-09-08: 80 mg via INTRAMUSCULAR

## 2013-09-08 NOTE — Addendum Note (Signed)
Addended by: Ilean China on: 09/08/2013 02:19 PM   Modules accepted: Orders

## 2013-09-08 NOTE — Progress Notes (Signed)
   Subjective:    Patient ID: Angela Curry, female    DOB: [birth year], 63 y.o.   MRN: [id number]  HPI URI Symptoms Onset: 2 days  Description: rhinorrhea, nasal congestion, cough  Modifying factors:  Baseline asthma, increased chest tightness   Symptoms Nasal discharge: yes Fever: no Sore throat: no Cough: yes Wheezing: yes Ear pain: no GI symptoms: no Sick contacts: yes  Red Flags  Stiff neck: no Dyspnea: no Rash: no Swallowing difficulty: no  Sinusitis Risk Factors Headache/face pain: no Double sickening: no tooth pain: no  Allergy Risk Factors Sneezing: no Itchy scratchy throat: no Seasonal symptoms: no  Flu Risk Factors Headache: no muscle aches: no severe fatigue: no     Review of Systems  All other systems reviewed and are negative.       Objective:   Physical Exam  Constitutional: She appears well-developed and well-nourished.  HENT:  Head: Normocephalic and atraumatic.  Right Ear: External ear normal.  Left Ear: External ear normal.  +nasal erythema, rhinorrhea bilaterally, + post oropharyngeal erythema    Eyes: Conjunctivae are normal. Pupils are equal, round, and reactive to light.  Neck: Normal range of motion. Neck supple.  Cardiovascular: Normal rate and regular rhythm.   Pulmonary/Chest: Effort normal and breath sounds normal.  Abdominal: Soft.  Musculoskeletal: Normal range of motion.  Neurological: She is alert.  Skin: Skin is warm.          Assessment & Plan:  URI (upper respiratory infection) - Plan: azithromycin (ZITHROMAX) 250 MG tablet  Wheezing Likely viral process with secondary wheezing in setting of asthma Depomedrol 80mg  IM x1 Oxygenating well on RA. No resp distress Discussed supportive care and infectious/resp red flags PPx rx for zpak if sxs worsen/fail to improve going into bad weather over the weekend.  Follow up as needed.

## 2013-09-10 NOTE — Telephone Encounter (Signed)
Per chart only have patient on aciphex. Please advise if ok to fill. Last seen in office on 09-08-13 for an acute visit.

## 2013-10-04 ENCOUNTER — Other Ambulatory Visit: Payer: Self-pay | Admitting: Nurse Practitioner

## 2013-11-10 ENCOUNTER — Other Ambulatory Visit: Payer: Self-pay | Admitting: Nurse Practitioner

## 2013-12-13 ENCOUNTER — Other Ambulatory Visit: Payer: Self-pay | Admitting: Nurse Practitioner

## 2013-12-24 ENCOUNTER — Other Ambulatory Visit: Payer: Self-pay | Admitting: *Deleted

## 2013-12-24 NOTE — Telephone Encounter (Signed)
Last seen in office on 09-08-13. Please advise

## 2013-12-27 MED ORDER — CLOBETASOL PROPIONATE 0.05 % EX FOAM: Freq: Two times a day (BID) | CUTANEOUS | Status: DC

## 2014-02-19 ENCOUNTER — Other Ambulatory Visit: Payer: Self-pay | Admitting: Family Medicine

## 2014-02-19 ENCOUNTER — Other Ambulatory Visit: Payer: Self-pay | Admitting: Nurse Practitioner

## 2014-03-22 ENCOUNTER — Other Ambulatory Visit: Payer: Self-pay

## 2014-03-22 NOTE — Telephone Encounter (Signed)
Last seen 09/08/13  Dr Ernestina Patches

## 2014-03-23 MED ORDER — LUBIPROSTONE 24 MCG PO CAPS: ORAL_CAPSULE | ORAL | Status: DC

## 2014-03-23 MED ORDER — PANTOPRAZOLE SODIUM 40 MG PO TBEC: DELAYED_RELEASE_TABLET | ORAL | Status: DC

## 2014-03-23 NOTE — Telephone Encounter (Signed)
no more refills without being seen  

## 2014-04-19 ENCOUNTER — Other Ambulatory Visit: Payer: Self-pay | Admitting: Nurse Practitioner

## 2014-04-20 ENCOUNTER — Other Ambulatory Visit: Payer: Self-pay | Admitting: Nurse Practitioner

## 2014-04-21 NOTE — Telephone Encounter (Signed)
Last labs 10/2012, last seen 07/2013 for cold

## 2014-05-21 ENCOUNTER — Other Ambulatory Visit: Payer: Self-pay | Admitting: Nurse Practitioner

## 2014-05-30 ENCOUNTER — Other Ambulatory Visit: Payer: Self-pay | Admitting: General Practice

## 2014-05-30 ENCOUNTER — Other Ambulatory Visit: Payer: Self-pay | Admitting: Nurse Practitioner

## 2014-05-30 NOTE — Telephone Encounter (Signed)
Last seen 08/2013 by Ernestina Patches

## 2014-06-23 ENCOUNTER — Other Ambulatory Visit: Payer: Self-pay | Admitting: Nurse Practitioner

## 2014-06-27 ENCOUNTER — Other Ambulatory Visit: Payer: Self-pay | Admitting: Nurse Practitioner

## 2014-07-01 ENCOUNTER — Ambulatory Visit (INDEPENDENT_AMBULATORY_CARE_PROVIDER_SITE_OTHER): Payer: BC Managed Care – PPO | Admitting: Family Medicine

## 2014-07-01 ENCOUNTER — Encounter: Payer: Self-pay | Admitting: Family Medicine

## 2014-07-01 VITALS — BP 154/82 | HR 100 | Temp 98.0°F | Ht 62.5 in | Wt 202.1 lb

## 2014-07-01 DIAGNOSIS — K0889 Other specified disorders of teeth and supporting structures: Secondary | ICD-10-CM

## 2014-07-01 DIAGNOSIS — K088 Other specified disorders of teeth and supporting structures: Secondary | ICD-10-CM

## 2014-07-01 MED ORDER — AMOXICILLIN 875 MG PO TABS: 875.0000 mg | ORAL_TABLET | Freq: Two times a day (BID) | ORAL | Status: DC

## 2014-07-01 MED ORDER — VALACYCLOVIR HCL 1 G PO TABS: 1000.0000 mg | ORAL_TABLET | Freq: Two times a day (BID) | ORAL | Status: DC

## 2014-07-01 MED ORDER — HYDROCODONE-ACETAMINOPHEN 5-325 MG PO TABS: 1.0000 | ORAL_TABLET | Freq: Four times a day (QID) | ORAL | Status: DC | PRN

## 2014-07-01 NOTE — Progress Notes (Signed)
   Subjective:    Patient ID: Angela Curry, female    DOB: 08/25/1950, 63 y.o.   MRN: [id number]  HPI She has right lower molar dental pain and she is scheduled to see dentist next week and she is severe pain.  Review of Systems  Constitutional: Negative for fever.  HENT: Negative for ear pain.   Eyes: Negative for discharge.  Respiratory: Negative for cough.   Cardiovascular: Negative for chest pain.  Gastrointestinal: Negative for abdominal distention.  Endocrine: Negative for polyuria.  Genitourinary: Negative for difficulty urinating.  Musculoskeletal: Negative for gait problem and neck pain.  Skin: Negative for color change and rash.  Neurological: Negative for speech difficulty and headaches.  Psychiatric/Behavioral: Negative for agitation.       Objective:    BP 154/82 mmHg  Pulse 100  Temp(Src) 98 F (36.7 C) (Oral)  Ht 5' 2.5" (1.588 m)  Wt 202 lb 1.6 oz (91.672 kg)  BMI 36.35 kg/m2 Physical Exam  Constitutional: She is oriented to person, place, and time. She appears well-developed and well-nourished.  HENT:  Head: Normocephalic and atraumatic.  Mouth/Throat: Oropharynx is clear and moist.  Eyes: Pupils are equal, round, and reactive to light.  Neck: Normal range of motion. Neck supple.  Cardiovascular: Normal rate and regular rhythm.   No murmur heard. Pulmonary/Chest: Effort normal and breath sounds normal.  Abdominal: Soft. Bowel sounds are normal. There is no tenderness.  Neurological: She is alert and oriented to person, place, and time.  Skin: Skin is warm and dry.  Psychiatric: She has a normal mood and affect.          Assessment & Plan:     ICD-9-CM ICD-10-CM   1. Pain, dental 525.9 K08.8 valACYclovir (VALTREX) 1000 MG tablet     HYDROcodone-acetaminophen (NORCO) 5-325 MG per tablet     amoxicillin (AMOXIL) 875 MG tablet     No Follow-up on file.  Lysbeth Penner FNP

## 2014-07-13 ENCOUNTER — Telehealth: Payer: Self-pay | Admitting: Family Medicine

## 2014-07-13 MED ORDER — FLUCONAZOLE 150 MG PO TABS: 150.0000 mg | ORAL_TABLET | Freq: Once | ORAL | Status: DC

## 2014-07-13 NOTE — Telephone Encounter (Signed)
Per [facility] - ok  Med sent in and pt aware

## 2014-07-26 ENCOUNTER — Other Ambulatory Visit: Payer: Self-pay | Admitting: Nurse Practitioner

## 2014-08-01 ENCOUNTER — Other Ambulatory Visit: Payer: Self-pay | Admitting: Nurse Practitioner

## 2014-08-09 ENCOUNTER — Other Ambulatory Visit: Payer: Self-pay | Admitting: Nurse Practitioner

## 2014-08-24 ENCOUNTER — Ambulatory Visit (INDEPENDENT_AMBULATORY_CARE_PROVIDER_SITE_OTHER): Payer: BLUE CROSS/BLUE SHIELD | Admitting: Family

## 2014-08-24 ENCOUNTER — Encounter: Payer: Self-pay | Admitting: Family

## 2014-08-24 VITALS — BP 119/75 | HR 84 | Temp 97.3°F | Ht 62.5 in | Wt 197.0 lb

## 2014-08-24 DIAGNOSIS — K219 Gastro-esophageal reflux disease without esophagitis: Secondary | ICD-10-CM | POA: Insufficient documentation

## 2014-08-24 DIAGNOSIS — K449 Diaphragmatic hernia without obstruction or gangrene: Secondary | ICD-10-CM

## 2014-08-24 DIAGNOSIS — E7800 Pure hypercholesterolemia, unspecified: Secondary | ICD-10-CM

## 2014-08-24 DIAGNOSIS — K5900 Constipation, unspecified: Secondary | ICD-10-CM

## 2014-08-24 DIAGNOSIS — Z23 Encounter for immunization: Secondary | ICD-10-CM

## 2014-08-24 DIAGNOSIS — E559 Vitamin D deficiency, unspecified: Secondary | ICD-10-CM

## 2014-08-24 DIAGNOSIS — Z0000 Encounter for general adult medical examination without abnormal findings: Secondary | ICD-10-CM

## 2014-08-24 DIAGNOSIS — K5909 Other constipation: Secondary | ICD-10-CM | POA: Insufficient documentation

## 2014-08-24 MED ORDER — CONJ ESTROG-MEDROXYPROGEST ACE 0.3-1.5 MG PO TABS: 1.0000 | ORAL_TABLET | Freq: Every day | ORAL | Status: DC

## 2014-08-24 MED ORDER — PANTOPRAZOLE SODIUM 40 MG PO TBEC: 40.0000 mg | DELAYED_RELEASE_TABLET | Freq: Every day | ORAL | Status: DC

## 2014-08-24 MED ORDER — DEXLANSOPRAZOLE 30 MG PO CPDR: 30.0000 mg | DELAYED_RELEASE_CAPSULE | Freq: Every day | ORAL | Status: DC

## 2014-08-24 MED ORDER — LUBIPROSTONE 24 MCG PO CAPS: 24.0000 ug | ORAL_CAPSULE | Freq: Two times a day (BID) | ORAL | Status: DC

## 2014-08-24 NOTE — Patient Instructions (Signed)

## 2014-08-24 NOTE — Progress Notes (Signed)
Subjective:    Patient ID: Angela Curry, female    DOB: [birth year], 64 y.o.   MRN: [id number]  Pt presents to the office for chronic follow up and lab work.  Gastrophageal Reflux She reports no belching, no coughing, no heartburn or no sore throat. This is a chronic problem. The current episode started more than 1 year ago. The problem occurs occasionally. The problem has been waxing and waning. The symptoms are aggravated by certain foods and lying down. Risk factors include hiatal hernia. She has tried a PPI for the symptoms. The treatment provided moderate relief.  Constipation This is a chronic problem. The current episode started more than 1 year ago. The problem has been resolved since onset. The patient is on a high fiber diet. She exercises regularly. Treatments tried: [place]. The treatment provided significant relief.   *Pt has been told she has hyperlipidemia in the past. Pt does not want cholesterol checked nor does she want to be on medication for cholesterol. Pt states she has been on the medication in the past and it makes her legs hurt and just not feel good. Pt states she tries a low fat diet.    Review of Systems  Constitutional: Negative.   HENT: Negative.  Negative for sore throat.   Eyes: Negative.   Respiratory: Negative.  Negative for cough and shortness of breath.   Cardiovascular: Negative.  Negative for palpitations.  Gastrointestinal: Positive for constipation. Negative for heartburn.  Endocrine: Negative.   Genitourinary: Negative.   Musculoskeletal: Negative.   Neurological: Negative.  Negative for headaches.  Hematological: Negative.   Psychiatric/Behavioral: Negative.   All other systems reviewed and are negative.      Objective:   Physical Exam  Constitutional: She is oriented to person, place, and time. She appears well-developed and well-nourished. No distress.  HENT:  Head: Normocephalic and atraumatic.  Right Ear: External ear normal.   Mouth/Throat: Oropharynx is clear and moist.  Eyes: Pupils are equal, round, and reactive to light.  Neck: Normal range of motion. Neck supple. No thyromegaly present.  Cardiovascular: Normal rate, regular rhythm, normal heart sounds and intact distal pulses.   No murmur heard. Pulmonary/Chest: Effort normal and breath sounds normal. No respiratory distress. She has no wheezes.  Abdominal: Soft. Bowel sounds are normal. She exhibits no distension. There is no tenderness.  Musculoskeletal: Normal range of motion. She exhibits no edema or tenderness.  Neurological: She is alert and oriented to person, place, and time. She has normal reflexes. No cranial nerve deficit.  Skin: Skin is warm and dry.  Psychiatric: She has a normal mood and affect. Her behavior is normal. Judgment and thought content normal.  Vitals reviewed.    BP 119/75 mmHg  Pulse 84  Temp(Src) 97.3 F (36.3 C) (Oral)  Ht 5' 2.5" (1.588 m)  Wt 197 lb (89.359 kg)  BMI 35.44 kg/m2      Assessment & Plan:  1. Need for pneumococcal vaccination - Pneumococcal conjugate vaccine 13-valent IM  2. Vitamin D deficiency - CMP14+EGFR - Vit D  25 hydroxy (rtn osteoporosis monitoring)  3. Gastroesophageal reflux disease, esophagitis presence not specified -Pt states she wanted to try Dexilant, but wanted both rx sent to pharmacy if her insurance would not pay for the dexilant or if it did not work as well as the protonix did.   CMP14+EGFR - pantoprazole (PROTONIX) 40 MG tablet; Take 1 tablet (40 mg total) by mouth daily.  Dispense: 90 tablet; Refill: 4 -  Dexlansoprazole (DEXILANT) 30 MG capsule; Take 1 capsule (30 mg total) by mouth daily.  Dispense: 90 capsule; Refill: 1  4. Hiatal hernia - CMP14+EGFR - pantoprazole (PROTONIX) 40 MG tablet; Take 1 tablet (40 mg total) by mouth daily.  Dispense: 90 tablet; Refill: 4 - Dexlansoprazole (DEXILANT) 30 MG capsule; Take 1 capsule (30 mg total) by mouth daily.  Dispense: 90  capsule; Refill: 1  5. Elevated cholesterol - CMP14+EGFR  6. Chronic constipation - lubiprostone (AMITIZA) 24 MCG capsule; Take 1 capsule (24 mcg total) by mouth 2 (two) times daily.  Dispense: 180 capsule; Refill: 4   Continue all meds Labs pending Health Maintenance reviewed Diet and exercise encouraged RTO 1 year or prn  [name], FNP

## 2014-08-25 ENCOUNTER — Other Ambulatory Visit: Payer: Self-pay | Admitting: Family

## 2014-08-25 LAB — CMP14+EGFR
ALBUMIN: 4.6 g/dL (ref 3.6–4.8)
ALK PHOS: 114 IU/L (ref 39–117)
ALT: 56 IU/L — ABNORMAL HIGH (ref 0–32)
AST: 57 IU/L — AB (ref 0–40)
Albumin/Globulin Ratio: 1.8 (ref 1.1–2.5)
BILIRUBIN TOTAL: 0.4 mg/dL (ref 0.0–1.2)
BUN/Creatinine Ratio: 14 (ref 11–26)
BUN: 9 mg/dL (ref 8–27)
CHLORIDE: 107 mmol/L (ref 97–108)
CO2: 17 mmol/L — ABNORMAL LOW (ref 18–29)
CREATININE: 0.63 mg/dL (ref 0.57–1.00)
Calcium: 10 mg/dL (ref 8.7–10.3)
GFR, EST AFRICAN AMERICAN: 111 mL/min/{1.73_m2} (ref >59)
GFR, EST NON AFRICAN AMERICAN: 96 mL/min/{1.73_m2} (ref >59)
GLOBULIN, TOTAL: 2.6 g/dL (ref 1.5–4.5)
GLUCOSE: 112 mg/dL — AB (ref 65–99)
POTASSIUM: 4.6 mmol/L (ref 3.5–5.2)
Sodium: 145 mmol/L — ABNORMAL HIGH (ref 134–144)
Total Protein: 7.2 g/dL (ref 6.0–8.5)

## 2014-08-25 LAB — VITAMIN D 25 HYDROXY (VIT D DEFICIENCY, FRACTURES): Vit D, 25-Hydroxy: 6.3 ng/mL — ABNORMAL LOW (ref 30.0–100.0)

## 2014-08-25 MED ORDER — VITAMIN D (ERGOCALCIFEROL) 1.25 MG (50000 UNIT) PO CAPS: 50000.0000 [IU] | ORAL_CAPSULE | ORAL | Status: DC

## 2014-11-18 ENCOUNTER — Encounter: Payer: Self-pay | Admitting: Physician Assistant

## 2014-11-18 ENCOUNTER — Encounter: Payer: Self-pay | Admitting: Family Medicine

## 2014-11-18 ENCOUNTER — Ambulatory Visit (INDEPENDENT_AMBULATORY_CARE_PROVIDER_SITE_OTHER): Payer: BLUE CROSS/BLUE SHIELD | Admitting: Physician Assistant

## 2014-11-18 VITALS — BP 136/87 | HR 84 | Ht 62.5 in | Wt 196.0 lb

## 2014-11-18 DIAGNOSIS — Z0000 Encounter for general adult medical examination without abnormal findings: Secondary | ICD-10-CM

## 2014-11-18 DIAGNOSIS — R202 Paresthesia of skin: Secondary | ICD-10-CM | POA: Diagnosis not present

## 2014-11-18 DIAGNOSIS — H539 Unspecified visual disturbance: Secondary | ICD-10-CM

## 2014-11-18 LAB — POCT CBC
GRANULOCYTE PERCENT: 64.2 % (ref 37–80)
HCT, POC: 43.6 % (ref 37.7–47.9)
Hemoglobin: 14 g/dL (ref 12.2–16.2)
Lymph, poc: 2.2 (ref 0.6–3.4)
MCH: 26.7 pg — AB (ref 27–31.2)
MCHC: 32.1 g/dL (ref 31.8–35.4)
MCV: 83.1 fL (ref 80–97)
MPV: 7.5 fL (ref 0–99.8)
POC Granulocyte: 4.9 (ref 2–6.9)
POC LYMPH PERCENT: 28.6 %L (ref 10–50)
Platelet Count, POC: 354 10*3/uL (ref 142–424)
RBC: 5.24 M/uL (ref 4.04–5.48)
RDW, POC: 13.8 %
WBC: 7.6 10*3/uL (ref 4.6–10.2)

## 2014-11-18 NOTE — Patient Instructions (Signed)
-   MONITOR BLOOD PRESSURE AT DIFFERENT TIMES DURING THE DAY. LOG ON HANDOUT - EYE EXAMINATION - MAKE SURE YOU TELL THEM ABOUT SYMPTOMS - GO TO ER IF SYMPTOMS OCCUR AGAIN AND OFFICE IS NOT OPEN - IF YOU DECIDE TO HAVE A CT, CALL THE OFFICE AND I WILL SCHEDULE.

## 2014-11-18 NOTE — Progress Notes (Signed)
   Subjective:    Patient ID: Angela Curry, female    DOB: 1951/02/04, 64 y.o.   MRN: [id number]  HPI 64 y/o female presents with c/o episode last night while she was looking at tv. She was watching tv and had abnormalty with vison - saw blurry, pulsating, centralized "spot" in center of tv. Episode lasted 10-15 min. . Last eye exam was 3 years ago. Patient's daughter took BP at that time and it was elevated ( 163/104 - 176/107). Mild tingling on left arm.   Family hx of stroke and heart attack.     Review of Systems  Constitutional: Negative.   Eyes: Positive for visual disturbance (few minutes of central blurred vision with "waves" x 2 episodes ). Negative for photophobia, pain, discharge, redness and itching.  Respiratory: Negative.   Neurological:       Episode of tingling on left arm x 1, she thinks this was associated with episode of blurred vision but unsure  All other systems reviewed and are negative.      Objective:   Physical Exam  Constitutional: She is oriented to person, place, and time. She appears well-developed and well-nourished. No distress.  HENT:  Head: Normocephalic and atraumatic.  Eyes: EOM are normal. Pupils are equal, round, and reactive to light.  Cardiovascular: Normal rate, regular rhythm and normal heart sounds.  Exam reveals no gallop and no friction rub.   No murmur heard. Pulmonary/Chest: Effort normal and breath sounds normal. No respiratory distress. She has no wheezes. She has no rales. She exhibits no tenderness.  Abdominal: Soft. She exhibits no distension. There is no tenderness.  Musculoskeletal: She exhibits no edema or tenderness.  Neurological: She is alert and oriented to person, place, and time. She displays normal reflexes. No cranial nerve deficit. Coordination normal.  Skin: She is not diaphoretic.  Psychiatric: She has a normal mood and affect. Her behavior is normal. Judgment and thought content normal.  Nursing note and  vitals reviewed.         Assessment & Plan:  1. Vision disturbance -Advised patient to f/u with optometrist for eye exam - EKG 12-Lead - POCT CBC - CMP14+EGFR  2. Preventative health care  - POCT CBC - CMP14+EGFR   Will wait to determine f/u when labs come back, however advised patient to record BP readindings and go to ED if episode returns.   Nolyn Eilert A. Benjamin Stain PA-C

## 2014-11-20 LAB — CMP14+EGFR
A/G RATIO: 1.6 (ref 1.1–2.5)
ALBUMIN: 4.4 g/dL (ref 3.6–4.8)
ALK PHOS: 128 IU/L — AB (ref 39–117)
ALT: 43 IU/L — ABNORMAL HIGH (ref 0–32)
AST: 36 IU/L (ref 0–40)
BUN/Creatinine Ratio: 15 (ref 11–26)
BUN: 11 mg/dL (ref 8–27)
Bilirubin Total: 0.3 mg/dL (ref 0.0–1.2)
CALCIUM: 9.9 mg/dL (ref 8.7–10.3)
CO2: 21 mmol/L (ref 18–29)
Chloride: 99 mmol/L (ref 97–108)
Creatinine, Ser: 0.72 mg/dL (ref 0.57–1.00)
GFR, EST AFRICAN AMERICAN: 104 mL/min/{1.73_m2} (ref >59)
GFR, EST NON AFRICAN AMERICAN: 90 mL/min/{1.73_m2} (ref >59)
GLOBULIN, TOTAL: 2.8 g/dL (ref 1.5–4.5)
Glucose: 96 mg/dL (ref 65–99)
POTASSIUM: 4.3 mmol/L (ref 3.5–5.2)
Sodium: 140 mmol/L (ref 134–144)
Total Protein: 7.2 g/dL (ref 6.0–8.5)

## 2014-11-24 ENCOUNTER — Other Ambulatory Visit: Payer: Self-pay | Admitting: Physician Assistant

## 2014-11-24 DIAGNOSIS — R899 Unspecified abnormal finding in specimens from other organs, systems and tissues: Secondary | ICD-10-CM

## 2014-11-25 ENCOUNTER — Telehealth: Payer: Self-pay | Admitting: Physician Assistant

## 2014-11-25 NOTE — Telephone Encounter (Signed)
Hepatic function panel ordered

## 2014-11-29 ENCOUNTER — Encounter: Payer: Self-pay | Admitting: *Deleted

## 2014-12-07 ENCOUNTER — Ambulatory Visit: Payer: BLUE CROSS/BLUE SHIELD | Admitting: Physician Assistant

## 2014-12-15 ENCOUNTER — Encounter: Payer: Self-pay | Admitting: Physician Assistant

## 2014-12-15 ENCOUNTER — Ambulatory Visit (INDEPENDENT_AMBULATORY_CARE_PROVIDER_SITE_OTHER): Payer: BLUE CROSS/BLUE SHIELD | Admitting: Physician Assistant

## 2014-12-15 VITALS — BP 140/89 | HR 72 | Temp 98.0°F | Ht 62.5 in | Wt 198.0 lb

## 2014-12-15 DIAGNOSIS — R5383 Other fatigue: Secondary | ICD-10-CM

## 2014-12-15 DIAGNOSIS — R740 Nonspecific elevation of levels of transaminase and lactic acid dehydrogenase [LDH]: Secondary | ICD-10-CM

## 2014-12-15 DIAGNOSIS — J209 Acute bronchitis, unspecified: Secondary | ICD-10-CM

## 2014-12-15 DIAGNOSIS — J309 Allergic rhinitis, unspecified: Secondary | ICD-10-CM | POA: Diagnosis not present

## 2014-12-15 DIAGNOSIS — L409 Psoriasis, unspecified: Secondary | ICD-10-CM

## 2014-12-15 DIAGNOSIS — R748 Abnormal levels of other serum enzymes: Secondary | ICD-10-CM

## 2014-12-15 MED ORDER — CLOBETASOL PROPIONATE 0.05 % EX SOLN: CUTANEOUS | Status: DC

## 2014-12-15 MED ORDER — CLOBETASOL PROPIONATE 0.05 % EX OINT: 1.0000 "application " | TOPICAL_OINTMENT | Freq: Two times a day (BID) | CUTANEOUS | Status: DC

## 2014-12-15 MED ORDER — FLUTICASONE PROPIONATE 50 MCG/ACT NA SUSP: 2.0000 | Freq: Every day | NASAL | Status: DC

## 2014-12-15 MED ORDER — AZITHROMYCIN 250 MG PO TABS: ORAL_TABLET | ORAL | Status: DC

## 2014-12-15 MED ORDER — ALBUTEROL SULFATE (2.5 MG/3ML) 0.083% IN NEBU: 2.5000 mg | INHALATION_SOLUTION | Freq: Four times a day (QID) | RESPIRATORY_TRACT | Status: DC | PRN

## 2014-12-15 NOTE — Patient Instructions (Signed)
-   DOVE SOAP ONLY, AVEENO ECZEMA LOTION  APPLY MOISTURIZER TO SKIN WITHIN 3 MINUTES OF BATH OR SHOWER CAN TAKE ZYRTEC 10MG  DAILY FOR ITCH OR IRRITATION       Psoriasis Psoriasis is a long-lasting (chronic) skin problem. It can cause red bumps, a rash, scales that flake off, bleeding, and joint pain (arthritis). Psoriasis often affects the elbows, knees, groin, genitals, arms, legs, scalp, and nails. Psoriasis cannot be passed from person to person (not contagious).  HOME CARE  Only take medicine as told by your doctor.  Keep all doctor visits as told. You may need to try many treatments to find what works for you.  Avoid sunburn, cuts, scrapes, alcohol, smoking, and stress.  Wear gloves when you wash dishes, clean, or go outside in the cold.  Keep the air moist and cool in your home. You can use a humidifier.  Put lotion on right after a bath or shower.  Avoid long, hot baths and showers. Do not use a lot of soap.  Drink enough fluids to keep your pee (urine) clear or pale yellow. GET HELP RIGHT AWAY IF:   You have pain in the affected areas.  You have bleeding that does not stop in the affected areas.  You have more redness or warmth in the affected areas.  You have painful or stiff joints.  You feel depressed.  You have a fever. MAKE SURE YOU:  Understand these instructions.  Will watch your condition.  Will get help right away if you are not doing well or get worse. Document Released: 08/08/2004 Document Revised: 09/23/2011 Document Reviewed: 12/28/2010 [facility] Patient Information 2015 [place], [place]. This information is not intended to replace advice given to you by your health care provider. Make sure you discuss any questions you have with your health care provider.

## 2014-12-15 NOTE — Progress Notes (Signed)
   Subjective:    Patient ID: Angela Curry, female    DOB: 08/25/1950, 64 y.o.   MRN: [id number]  HPI 64 y/o female presents for follow up of visual symptoms with 2 episodes of blurry central vision while watching tv. She has not had any further episodes since her appointment with me. Her only complain today is continued fatigue.   She had abnormal labs at her last visit, Vit D and abnormal hepatic labs. She presents today for hepatic function panel. Hepatitis screening.   She has "cold symptoms" today with nasal congestion. Has tried a few otc meds with some relief.     Review of Systems  Constitutional: Positive for fatigue.  HENT: Positive for congestion (nasal ), postnasal drip and sneezing.   Neurological: Negative for numbness.  All other systems reviewed and are negative.      Objective:   Physical Exam  Constitutional: She is oriented to person, place, and time. She appears well-developed and well-nourished. No distress.  Cardiovascular: Normal rate, regular rhythm and normal heart sounds.  Exam reveals no gallop and no friction rub.   No murmur heard. Pulmonary/Chest: Effort normal. No respiratory distress. She has wheezes (end expiratory wheezes bilateral posterior BS, hx of asthma).  Neurological: She is alert and oriented to person, place, and time.  Skin: She is not diaphoretic.  Psychiatric: She has a normal mood and affect. Her behavior is normal. Judgment and thought content normal.  Nursing note and vitals reviewed.         Assessment & Plan:  1. Other fatigue  - Vitamin B12 - Thyroid Panel With TSH  2. Abnormal AST and ALT  - Hepatitis panel, acute  3. Allergic rhinitis, unspecified allergic rhinitis type - Zyrtec 10mg  daily  - fluticasone (FLONASE) 50 MCG/ACT nasal spray; Place 2 sprays into both nostrils daily.  Dispense: 16 g; Refill: 11  4. Psoriasis -Dove soap - Aveeno lotion - clobetasol ointment (TEMOVATE) 0.05 %; Apply 1  application topically 2 (two) times daily.  Dispense: 30 g; Refill: 5 - clobetasol (TEMOVATE) 0.05 % external solution; Apply 2-3 drop to AA of scalp prn  Dispense: 50 mL; Refill: 0 for scalp   5. Acute bronchitis, unspecified organism  - albuterol (PROVENTIL) (2.5 MG/3ML) 0.083% nebulizer solution; Take 3 mLs (2.5 mg total) by nebulization every 6 (six) hours as needed for wheezing or shortness of breath.  Dispense: 150 mL; Refill: 1 - azithromycin (ZITHROMAX) 250 MG tablet; 2 tabs on day 1, 1 tab on day 2-5  Dispense: 6 tablet; Refill: 0   Continue all meds Labs pending Health Maintenance reviewed Diet and exercise encouraged Will determine f/u once labs return   Tiffany A. Benjamin Stain PA-C

## 2014-12-16 ENCOUNTER — Telehealth: Payer: Self-pay | Admitting: Physician Assistant

## 2014-12-16 DIAGNOSIS — J209 Acute bronchitis, unspecified: Secondary | ICD-10-CM

## 2014-12-16 LAB — VITAMIN B12: Vitamin B-12: 497 pg/mL (ref 211–946)

## 2014-12-16 LAB — HEPATITIS PANEL, ACUTE
Hep A IgM: NEGATIVE
Hep B C IgM: NEGATIVE
Hep C Virus Ab: <0.1 s/co ratio (ref 0.0–0.9)
Hepatitis B Surface Ag: NEGATIVE

## 2014-12-16 LAB — THYROID PANEL WITH TSH
FREE THYROXINE INDEX: 1.8 (ref 1.2–4.9)
T3 Uptake Ratio: 23 % — ABNORMAL LOW (ref 24–39)
T4, Total: 7.8 ug/dL (ref 4.5–12.0)
TSH: 2.81 u[IU]/mL (ref 0.450–4.500)

## 2014-12-16 NOTE — Telephone Encounter (Signed)
Pt called and aware - we will fax RX to [facility].

## 2014-12-17 ENCOUNTER — Other Ambulatory Visit: Payer: Self-pay | Admitting: *Deleted

## 2014-12-17 MED ORDER — ALBUTEROL SULFATE HFA 108 (90 BASE) MCG/ACT IN AERS: 1.0000 | INHALATION_SPRAY | Freq: Four times a day (QID) | RESPIRATORY_TRACT | Status: DC | PRN

## 2014-12-17 NOTE — Progress Notes (Signed)
Albuterol solution sent in to pharmacy  Should have been inhaler RX sent into [facility] per Marline Backbone

## 2015-02-21 ENCOUNTER — Other Ambulatory Visit: Payer: Self-pay | Admitting: Family

## 2015-03-06 ENCOUNTER — Ambulatory Visit: Payer: BLUE CROSS/BLUE SHIELD | Admitting: Family Medicine

## 2015-03-06 ENCOUNTER — Telehealth: Payer: Self-pay | Admitting: Family

## 2015-03-06 NOTE — Telephone Encounter (Signed)
Appointment given for today with Dr. Livia Snellen @ 3:25pm.

## 2015-03-07 ENCOUNTER — Ambulatory Visit (INDEPENDENT_AMBULATORY_CARE_PROVIDER_SITE_OTHER): Payer: BLUE CROSS/BLUE SHIELD | Admitting: Family Medicine

## 2015-03-07 ENCOUNTER — Encounter: Payer: Self-pay | Admitting: Family Medicine

## 2015-03-07 VITALS — BP 127/84 | HR 82 | Temp 97.3°F | Ht 62.5 in | Wt 203.6 lb

## 2015-03-07 DIAGNOSIS — R040 Epistaxis: Secondary | ICD-10-CM

## 2015-03-07 NOTE — Progress Notes (Signed)
Patient ID: MALONE ADMIRE, female   DOB: 05-Oct-1950, 64 y.o.   MRN: [id number]   HPI  Patient presents today for evaluation of frequent nosebleeds  Patient explains not 2 weeks ago she began having frequent nosebleeds from the left nostril. Previously, several years ago she had frequent nosebleeds of her right nostril that improved after cauterization. She states that the first nosebleed from this episode, from her left nostril, lasted about one hour. She's had 2 minor bleeding episodes since then lasting 5-15 minutes each and one more major bleed lasting 30 minutes.  She uses a tampon lodged in her nose to apply pressure and stop the bleeding. She states she develops a mild headache after the nosebleed. She states that she has been mowing grass a lot lately and worries that it's partly due to her allergies.  She's not tried anything besides Benadryl for this.  PMH: Smoking status noted ROS: Per HPI  Objective: BP 127/84 mmHg  Pulse 82  Temp(Src) 97.3 F (36.3 C) (Oral)  Ht 5' 2.5" (1.588 m)  Wt 203 lb 9.6 oz (92.352 kg)  BMI 36.62 kg/m2 Gen: NAD, alert, cooperative with exam HEENT: NCAT, left nares clear, small scab apperent but no superficial vessels CV: RRR, good S1/S2, no murmur Resp: CTABL, no wheezes, non-labored Ext: No edema, warm Neuro: Alert and oriented, No gross deficits  Assessment and plan:  Epistaxis Recurrent severe nosebleeds Discussed safety measures, afrin if needed nasal saline gel Reasons to seek emergency care Refer to ENT    Orders Placed This Encounter  Procedures  . Ambulatory referral to ENT    Referral Priority:  Routine    Referral Type:  Consultation    Referral Reason:  Specialty Services Required    Requested Specialty:  Otolaryngology    Number of Visits Requested:  [place], MD [facility] 03/07/2015, 5:17 PM

## 2015-03-07 NOTE — Patient Instructions (Signed)
Great to meet you!  Try [place] Nasal saline gel, Afrin if you bleed more than 20 minutes (blow your nose then spray)  Please seek medical attention if your nose will not stop bleeding  Nosebleed Nosebleeds can be caused by many conditions, including trauma, infections, polyps, foreign bodies, dry mucous membranes or climate, medicines, and air conditioning. Most nosebleeds occur in the front of the nose. Because of this location, most nosebleeds can be controlled by pinching the nostrils gently and continuously for at least 10 to 20 minutes. The long, continuous pressure allows enough time for the blood to clot. If pressure is released during that 10 to 20 minute time period, the process may have to be started again. The nosebleed may stop by itself or quit with pressure, or it may need concentrated heating (cautery) or pressure from packing. HOME CARE INSTRUCTIONS   If your nose was packed, try to maintain the pack inside until your health care provider removes it. If a gauze pack was used and it starts to fall out, gently replace it or cut the end off. Do not cut if a balloon catheter was used to pack the nose. Otherwise, do not remove unless instructed.  Avoid blowing your nose for 12 hours after treatment. This could dislodge the pack or clot and start the bleeding again.  If the bleeding starts again, sit up and bend forward, gently pinching the front half of your nose continuously for 20 minutes.  If bleeding was caused by dry mucous membranes, use over-the-counter saline nasal spray or gel. This will keep the mucous membranes moist and allow them to heal. If you must use a lubricant, choose the water-soluble variety. Use it only sparingly and not within several hours of lying down.  Do not use petroleum jelly or mineral oil, as these may drip into the lungs and cause serious problems.  Maintain humidity in your home by using less air conditioning or by using a humidifier.  Do not use  aspirin or medicines which make bleeding more likely. Your health care provider can give you recommendations on this.  Resume normal activities as you are able, but try to avoid straining, lifting, or bending at the waist for several days.  If the nosebleeds become recurrent and the cause is unknown, your health care provider may suggest laboratory tests. SEEK MEDICAL CARE IF: You have a fever. SEEK IMMEDIATE MEDICAL CARE IF:   Bleeding recurs and cannot be controlled.  There is unusual bleeding from or bruising on other parts of the body.  Nosebleeds continue.  There is any worsening of the condition which originally brought you in.  You become light-headed, feel faint, become sweaty, or vomit blood. MAKE SURE YOU:   Understand these instructions.  Will watch your condition.  Will get help right away if you are not doing well or get worse. Document Released: 04/10/2005 Document Revised: 11/15/2013 Document Reviewed: 06/01/2009 [facility] Patient Information 2015 [place], [place]. This information is not intended to replace advice given to you by your health care provider. Make sure you discuss any questions you have with your health care provider.

## 2015-03-07 NOTE — Assessment & Plan Note (Addendum)
Recurrent severe nosebleeds Discussed safety measures, afrin if needed nasal saline gel Reasons to seek emergency care Refer to ENT

## 2015-05-02 ENCOUNTER — Ambulatory Visit (INDEPENDENT_AMBULATORY_CARE_PROVIDER_SITE_OTHER): Payer: BLUE CROSS/BLUE SHIELD | Admitting: Family Medicine

## 2015-05-02 ENCOUNTER — Encounter: Payer: Self-pay | Admitting: Family Medicine

## 2015-05-02 VITALS — BP 128/87 | HR 86 | Temp 97.0°F | Ht 62.5 in | Wt 205.2 lb

## 2015-05-02 DIAGNOSIS — R062 Wheezing: Secondary | ICD-10-CM | POA: Diagnosis not present

## 2015-05-02 MED ORDER — PREDNISONE 20 MG PO TABS: 40.0000 mg | ORAL_TABLET | Freq: Every day | ORAL | Status: DC

## 2015-05-02 NOTE — Progress Notes (Signed)
   HPI  Patient presents today here with cough.  Patient explains that about 6 days ago she began having respiratory infection including cough, nasal congestion, headache, eye pressure, malaise, and fever. Over the last 3 days her fever and malaise have improved. Her muscle aches have improved. But she continues to have her nagging cough. She's tried her inhaler with a little bit of improvement. She denies any dyspnea but has a very aggravating cough.  He is tolerating food and fluids easily. She is not having increased work of breathing  PMH: Smoking status noted ROS: Per HPI  Objective: BP 128/87 mmHg  Pulse 86  Temp(Src) 97 F (36.1 C) (Oral)  Ht 5' 2.5" (1.588 m)  Wt 205 lb 3.2 oz (93.078 kg)  BMI 36.91 kg/m2 Gen: NAD, alert, cooperative with exam HEENT: NCAT, TMs normal bilaterally, CV: RRR, good S1/S2, no murmur Resp: Nonlabored, expiratory wheezes  Ext: No edema, warm Neuro: Alert and oriented, No gross deficits  Assessment and plan:  # Wheezing, possible asthma exacerbation due to viral infection Given prednisone for airway inflammation Continue albuterol for bronchospasm If not improved within one week return, no need for antibiotics at this point    Meds ordered this encounter  Medications  . predniSONE (DELTASONE) 20 MG tablet    Sig: Take 2 tablets (40 mg total) by mouth daily with breakfast.    Dispense:  14 tablet    Refill:  0    Laroy Apple, MD [facility] 05/02/2015, 3:39 PM

## 2015-05-02 NOTE — Patient Instructions (Signed)
   Great to see you!  If you have a return of fevers please come back. If you get more short of breath, worsening cough, or arent getting better within 1 week please return.

## 2015-05-09 ENCOUNTER — Other Ambulatory Visit: Payer: Self-pay | Admitting: *Deleted

## 2015-05-09 DIAGNOSIS — K5909 Other constipation: Secondary | ICD-10-CM

## 2015-05-09 MED ORDER — LUBIPROSTONE 24 MCG PO CAPS: 24.0000 ug | ORAL_CAPSULE | Freq: Two times a day (BID) | ORAL | Status: DC

## 2015-06-01 ENCOUNTER — Telehealth: Payer: Self-pay | Admitting: Sports Medicine

## 2015-06-01 DIAGNOSIS — K5732 Diverticulitis of large intestine without perforation or abscess without bleeding: Secondary | ICD-10-CM | POA: Insufficient documentation

## 2015-06-01 MED ORDER — METRONIDAZOLE 500 MG PO TABS: 500.0000 mg | ORAL_TABLET | Freq: Two times a day (BID) | ORAL | Status: DC

## 2015-06-01 MED ORDER — CIPROFLOXACIN HCL 750 MG PO TABS: 750.0000 mg | ORAL_TABLET | Freq: Two times a day (BID) | ORAL | Status: DC

## 2015-06-01 NOTE — Telephone Encounter (Signed)
Moderate abdominal pain with fever over 100 deg F.  Pain is LLQ in a patient with a history of diverticulitis, no evidence of surgical abdomen, no N/V/D, per daughter (nurse).  Adding empiric Cipro and Flagyl and patient needs to follow up with MD if no better or worsens over the next couple of days.

## 2015-07-10 ENCOUNTER — Other Ambulatory Visit: Payer: Self-pay | Admitting: Physician Assistant

## 2015-08-13 ENCOUNTER — Other Ambulatory Visit: Payer: Self-pay | Admitting: Family Medicine

## 2015-09-13 ENCOUNTER — Other Ambulatory Visit: Payer: Self-pay

## 2015-09-13 MED ORDER — CONJ ESTROG-MEDROXYPROGEST ACE 0.3-1.5 MG PO TABS: 1.0000 | ORAL_TABLET | Freq: Every day | ORAL | Status: DC

## 2015-10-10 ENCOUNTER — Other Ambulatory Visit: Payer: Self-pay

## 2015-10-10 DIAGNOSIS — K5909 Other constipation: Secondary | ICD-10-CM

## 2015-10-10 DIAGNOSIS — J309 Allergic rhinitis, unspecified: Secondary | ICD-10-CM

## 2015-10-10 MED ORDER — LUBIPROSTONE 24 MCG PO CAPS: 24.0000 ug | ORAL_CAPSULE | Freq: Two times a day (BID) | ORAL | Status: DC

## 2015-11-07 ENCOUNTER — Other Ambulatory Visit: Payer: Self-pay | Admitting: Family Medicine

## 2015-12-05 ENCOUNTER — Other Ambulatory Visit: Payer: Self-pay | Admitting: Family

## 2015-12-05 NOTE — Telephone Encounter (Signed)
Last seen 05/02/15

## 2015-12-16 ENCOUNTER — Other Ambulatory Visit: Payer: Self-pay | Admitting: Family

## 2015-12-19 NOTE — Telephone Encounter (Signed)
Last seen 05/02/15  Dr Wendi Snipes

## 2015-12-28 ENCOUNTER — Encounter: Payer: Self-pay | Admitting: Family Medicine

## 2015-12-28 ENCOUNTER — Ambulatory Visit (INDEPENDENT_AMBULATORY_CARE_PROVIDER_SITE_OTHER): Payer: BLUE CROSS/BLUE SHIELD | Admitting: Family Medicine

## 2015-12-28 ENCOUNTER — Other Ambulatory Visit: Payer: Self-pay | Admitting: Family Medicine

## 2015-12-28 VITALS — BP 138/87 | HR 78 | Temp 97.0°F | Ht 62.5 in | Wt 208.8 lb

## 2015-12-28 DIAGNOSIS — M6588 Other synovitis and tenosynovitis, other site: Secondary | ICD-10-CM

## 2015-12-28 DIAGNOSIS — M7750 Other enthesopathy of unspecified foot: Secondary | ICD-10-CM

## 2015-12-28 MED ORDER — PREDNISONE 20 MG PO TABS
ORAL_TABLET | ORAL | Status: DC
Start: 2015-12-28 — End: 2016-01-09

## 2015-12-28 NOTE — Progress Notes (Signed)
BP 138/87 mmHg  Pulse 78  Temp(Src) 97 F (36.1 C) (Oral)  Ht 5' 2.5" (1.588 m)  Wt 208 lb 12.8 oz (94.711 kg)  BMI 37.56 kg/m2   Subjective:    Patient ID: Angela Curry, female    DOB: 1951/05/21, 65 y.o.   MRN: [id number]  HPI: Angela Curry is a 65 y.o. female presenting on 12/28/2015 for Ankle Pain   HPI Ankle pain Patient comes in with right medial ankle pain and swelling. She says is been sporadically hurting her in that area over the past year but has become more prominent over the past few weeks. She says now it is hurting more than it is not hurting. She denies any trauma or any changes. She does admit that she is overweight and she knows that Plays a factor into it. She has developed some knee pain in that right knee just over the past week but she believes that's because she is walking differently because of her ankle. She denies any fevers or chills or overlying warmth or erythema.  Relevant past medical, surgical, family and social history reviewed and updated as indicated. Interim medical history since our last visit reviewed. Allergies and medications reviewed and updated.  Review of Systems  Constitutional: Negative for fever and chills.  HENT: Negative for congestion, ear discharge and ear pain.   Eyes: Negative for redness and visual disturbance.  Respiratory: Negative for chest tightness and shortness of breath.   Cardiovascular: Negative for chest pain and leg swelling.  Genitourinary: Negative for dysuria and difficulty urinating.  Musculoskeletal: Positive for joint swelling and arthralgias. Negative for back pain and gait problem.  Skin: Negative for color change, rash and wound.  Neurological: Negative for light-headedness and headaches.  Psychiatric/Behavioral: Negative for behavioral problems and agitation.  All other systems reviewed and are negative.   Per HPI unless specifically indicated above     Medication List       This  list is accurate as of: 12/28/15  3:47 PM.  Always use your most recent med list.               albuterol 108 (90 Base) MCG/ACT inhaler  Commonly known as:  PROVENTIL HFA;VENTOLIN HFA  Inhale 1-2 puffs into the lungs every 6 (six) hours as needed for wheezing or shortness of breath.     clobetasol ointment 0.05 %  Commonly known as:  TEMOVATE  Apply 1 application topically 2 (two) times daily.     clobetasol 0.05 % external solution  Commonly known as:  TEMOVATE  Apply 2-3 drop to AA of scalp prn     DEXILANT 30 MG capsule  Generic drug:  Dexlansoprazole  TAKE (1) CAPSULE DAILY     fluticasone 50 MCG/ACT nasal spray  Commonly known as:  FLONASE  Place 2 sprays into both nostrils daily.     predniSONE 20 MG tablet  Commonly known as:  DELTASONE  2 po at same time daily for 5 Curry     PREMPRO 0.3-1.5 MG tablet  Generic drug:  estrogen (conjugated)-medroxyprogesterone  Take 1 tablet by mouth daily.     valACYclovir 1000 MG tablet  Commonly known as:  VALTREX  Take 1 tablet (1,000 mg total) by mouth 2 (two) times daily.     Vitamin D (Ergocalciferol) 50000 units Caps capsule  Commonly known as:  DRISDOL  Take 1 capsule (50,000 Units total) by mouth every 7 (seven) Curry.  Objective:    BP 138/87 mmHg  Pulse 78  Temp(Src) 97 F (36.1 C) (Oral)  Ht 5' 2.5" (1.588 m)  Wt 208 lb 12.8 oz (94.711 kg)  BMI 37.56 kg/m2  Wt Readings from Last 3 Encounters:  12/28/15 208 lb 12.8 oz (94.711 kg)  05/02/15 205 lb 3.2 oz (93.078 kg)  03/07/15 203 lb 9.6 oz (92.352 kg)    Physical Exam  Constitutional: She is oriented to person, place, and time. She appears well-developed and well-nourished. No distress.  HENT:  Right Ear: External ear normal.  Left Ear: External ear normal.  Nose: Nose normal.  Mouth/Throat: Oropharynx is clear and moist.  Eyes: Conjunctivae and EOM are normal. Pupils are equal, round, and reactive to light.  Neck: Neck supple. No thyromegaly  present.  Cardiovascular: Normal rate, regular rhythm, normal heart sounds and intact distal pulses.   No murmur heard. Pulmonary/Chest: Effort normal and breath sounds normal. No respiratory distress. She has no wheezes.  Musculoskeletal: Normal range of motion. She exhibits tenderness. She exhibits no edema.       Right foot: There is tenderness (Medial tenderness just distal to the medial malleolus on her right ankle) and swelling (Mild amount of swelling but no erythema or warmth). There is normal range of motion, no bony tenderness, normal capillary refill, no deformity and no laceration.  Lymphadenopathy:    She has no cervical adenopathy.  Neurological: She is alert and oriented to person, place, and time. Coordination normal.  Skin: Skin is warm and dry. No rash noted. She is not diaphoretic.  Psychiatric: She has a normal mood and affect. Her behavior is normal.  Nursing note and vitals reviewed.     Assessment & Plan:   Problem List Items Addressed This Visit    None    Visit Diagnoses    Tendinitis of ankle    -  Primary    Right medial ankle tendinitis, will try prednisone and see if we can suppress it     Relevant Medications    predniSONE (DELTASONE) 20 MG tablet        Follow up plan: Return if symptoms worsen or fail to improve.  Counseling provided for all of the vaccine components No orders of the defined types were placed in this encounter.    Caryl Pina, MD [facility] 12/28/2015, 3:47 PM

## 2015-12-28 NOTE — Telephone Encounter (Signed)
Last seen 05/02/15 Dr Terisa Starr PCP

## 2016-01-09 ENCOUNTER — Ambulatory Visit (INDEPENDENT_AMBULATORY_CARE_PROVIDER_SITE_OTHER): Payer: BLUE CROSS/BLUE SHIELD | Admitting: Sports Medicine

## 2016-01-09 ENCOUNTER — Encounter: Payer: Self-pay | Admitting: Sports Medicine

## 2016-01-09 ENCOUNTER — Ambulatory Visit (INDEPENDENT_AMBULATORY_CARE_PROVIDER_SITE_OTHER): Payer: BLUE CROSS/BLUE SHIELD

## 2016-01-09 VITALS — BP 147/82 | HR 74 | Resp 18 | Wt 210.1 lb

## 2016-01-09 DIAGNOSIS — M25471 Effusion, right ankle: Secondary | ICD-10-CM

## 2016-01-09 DIAGNOSIS — M7989 Other specified soft tissue disorders: Secondary | ICD-10-CM | POA: Diagnosis not present

## 2016-01-09 DIAGNOSIS — L409 Psoriasis, unspecified: Secondary | ICD-10-CM | POA: Diagnosis not present

## 2016-01-09 DIAGNOSIS — M25571 Pain in right ankle and joints of right foot: Secondary | ICD-10-CM | POA: Diagnosis not present

## 2016-01-09 DIAGNOSIS — S92251A Displaced fracture of navicular [scaphoid] of right foot, initial encounter for closed fracture: Secondary | ICD-10-CM

## 2016-01-09 DIAGNOSIS — M7731 Calcaneal spur, right foot: Secondary | ICD-10-CM | POA: Diagnosis not present

## 2016-01-09 DIAGNOSIS — E669 Obesity, unspecified: Secondary | ICD-10-CM | POA: Diagnosis not present

## 2016-01-09 MED ORDER — CALCIPOTRIENE 0.005 % EX CREA: TOPICAL_CREAM | Freq: Two times a day (BID) | CUTANEOUS | Status: DC

## 2016-01-09 MED ORDER — PHENTERMINE HCL 37.5 MG PO TABS: ORAL_TABLET | ORAL | Status: DC

## 2016-01-09 MED ORDER — MELOXICAM 15 MG PO TABS: ORAL_TABLET | ORAL | Status: DC

## 2016-01-09 MED ORDER — TOPIRAMATE 50 MG PO TABS: ORAL_TABLET | ORAL | Status: DC

## 2016-01-09 NOTE — Assessment & Plan Note (Signed)
Starting weight loss treatment.  Goal weight is 140 pounds. Starting phentermine and Topamax. Ultimately we will proceed with Contrave for maintenance. Return monthly for weight checks and refills.

## 2016-01-09 NOTE — Assessment & Plan Note (Signed)
Months of pain along the tarsal navicular medially, with sparing of the tibialis posterior tendon sheath. Ultrasound did not show evidence of tibialis posterior tendon sheath effusion, there was increased power Doppler signal in the cortex of the navicular bone itself suggesting stress injury. Examination of her feet did show pes cavus however there was significant breakdown of the longitudinal arch. X-rays. Custom orthotics as above, rehabilitation exercises given, meloxicam for pain. Return in one month.

## 2016-01-09 NOTE — Progress Notes (Signed)
   Subjective:    I'm seeing this patient as a consultation for:  [name], FNP  CC: Right ankle pain  HPI: This is a pleasant 65 year old female, for months she's had pain that she localizes along the medial aspect of the right ankle just distal to the medial malleolus, worse with weightbearing, no trauma. Symptoms are moderate, persistent.  Psoriasis: Using clobetasol ointment with moderate results, has never tried calcipotriene.  Obesity: Desires help losing weight.  Past medical history, Surgical history, Family history not pertinant except as noted below, Social history, Allergies, and medications have been entered into the medical record, reviewed, and no changes needed.   Review of Systems: No headache, visual changes, nausea, vomiting, diarrhea, constipation, dizziness, abdominal pain, skin rash, fevers, chills, night sweats, weight loss, swollen lymph nodes, body aches, joint swelling, muscle aches, chest pain, shortness of breath, mood changes, visual or auditory hallucinations.   Objective:   General: Well Developed, well nourished, and in no acute distress.  Neuro/Psych: Alert and oriented x3, extra-ocular muscles intact, able to move all 4 extremities, sensation grossly intact. Skin: Warm and dry, Typical appearing psoriasis plaques on the elbows. Respiratory: Not using accessory muscles, speaking in full sentences, trachea midline.  Cardiovascular: Pulses palpable, no extremity edema. Abdomen: Does not appear distended. Right ankle: No visible erythema or swelling. Range of motion is full in all directions. Strength is 5/5 in all directions. Stable lateral and medial ligaments; squeeze test and kleiger test unremarkable; Talar dome nontender; No pain at base of 5th MT; No tenderness over cuboid; Severe tenderness over the navicular prominence, no tenderness over the tibialis posterior tendon sheath. No tenderness on posterior aspects of lateral and medial  malleolus No sign of peroneal tendon subluxations; Negative tarsal tunnel tinel's There is significant breakdown of the longitudinal arch, she does however overall have pes cavus.  Patient was fitted for a : standard, cushioned, semi-rigid orthotic. The orthotic was heated and afterward the patient stood on the orthotic blank positioned on the orthotic stand. The patient was positioned in subtalar neutral position and 10 degrees of ankle dorsiflexion in a weight bearing stance. After completion of molding, a stable base was applied to the orthotic blank. The blank was ground to a stable position for weight bearing. Size: 7 Base: [employer] and safety inspector and Padding: None The patient ambulated these, and they were very comfortable.  Impression and Recommendations:   This case required medical decision making of moderate complexity.

## 2016-01-09 NOTE — Assessment & Plan Note (Signed)
Continue clobetasol, we will alternate with calcipotriol cream.

## 2016-01-26 ENCOUNTER — Other Ambulatory Visit: Payer: Self-pay | Admitting: Family

## 2016-02-06 ENCOUNTER — Ambulatory Visit (INDEPENDENT_AMBULATORY_CARE_PROVIDER_SITE_OTHER): Payer: BLUE CROSS/BLUE SHIELD | Admitting: Sports Medicine

## 2016-02-06 ENCOUNTER — Encounter: Payer: Self-pay | Admitting: Sports Medicine

## 2016-02-06 DIAGNOSIS — L409 Psoriasis, unspecified: Secondary | ICD-10-CM | POA: Diagnosis not present

## 2016-02-06 DIAGNOSIS — E669 Obesity, unspecified: Secondary | ICD-10-CM | POA: Diagnosis not present

## 2016-02-06 DIAGNOSIS — S92251D Displaced fracture of navicular [scaphoid] of right foot, subsequent encounter for fracture with routine healing: Secondary | ICD-10-CM | POA: Diagnosis not present

## 2016-02-06 MED ORDER — PHENTERMINE HCL 37.5 MG PO TABS: ORAL_TABLET | ORAL | 0 refills | Status: DC

## 2016-02-06 NOTE — Assessment & Plan Note (Signed)
11 pound weight loss after the first month on phentermine. Doing well, no adverse effects. Next line she did not take Topamax. This is okay, we will let some point switched to Contrave when she plateaus. Goal weight is 140 pounds.

## 2016-02-06 NOTE — Assessment & Plan Note (Signed)
90-100% better with custom orthotics, rehabilitation exercises, weight loss. Has self discontinued brace. Return as needed for this.

## 2016-02-06 NOTE — Assessment & Plan Note (Signed)
Fantastic response with calcipotriene, better than with her clobetasol. Continue calcipotriene but increase to twice a day, may do clobetasol once a day and even discontinued this.

## 2016-02-06 NOTE — Progress Notes (Signed)
  Subjective:    CC: Follow-up  HPI: Obesity: 11 pound weight loss after the first month on phentermine, has not taken Topamax.  Right navicular stress fracture: Resolved with custom orthotics, bracing.  Psoriasis: Improved with switching from clobetasol to calcipotriene.  Past medical history, Surgical history, Family history not pertinant except as noted below, Social history, Allergies, and medications have been entered into the medical record, reviewed, and no changes needed.   Review of Systems: No fevers, chills, night sweats, weight loss, chest pain, or shortness of breath.   Objective:    General: Well Developed, well nourished, and in no acute distress.  Neuro: Alert and oriented x3, extra-ocular muscles intact, sensation grossly intact.  HEENT: Normocephalic, atraumatic, pupils equal round reactive to light, neck supple, no masses, no lymphadenopathy, thyroid nonpalpable.  Skin: Warm and dry, no rashes. Cardiac: Regular rate and rhythm, no murmurs rubs or gallops, no lower extremity edema.  Respiratory: Clear to auscultation bilaterally. Not using accessory muscles, speaking in full sentences. Right Foot: No visible erythema or swelling. Range of motion is full in all directions. Strength is 5/5 in all directions. No hallux valgus. No pes cavus or pes planus. No abnormal callus noted. No pain over the navicular prominence, or base of fifth metatarsal. No tenderness to palpation of the calcaneal insertion of plantar fascia. No pain at the Achilles insertion. No pain over the calcaneal bursa. No pain of the retrocalcaneal bursa. No tenderness to palpation over the tarsals, metatarsals, or phalanges. No hallux rigidus or limitus. No tenderness palpation over interphalangeal joints. No pain with compression of the metatarsal heads. Neurovascularly intact distally.  Impression and Recommendations:    I spent 25 minutes with this patient, greater than 50% was  face-to-face time counseling regarding the above diagnoses

## 2016-02-20 DIAGNOSIS — H52223 Regular astigmatism, bilateral: Secondary | ICD-10-CM | POA: Diagnosis not present

## 2016-02-20 DIAGNOSIS — H5213 Myopia, bilateral: Secondary | ICD-10-CM | POA: Diagnosis not present

## 2016-02-20 DIAGNOSIS — H524 Presbyopia: Secondary | ICD-10-CM | POA: Diagnosis not present

## 2016-02-23 ENCOUNTER — Other Ambulatory Visit: Payer: Self-pay | Admitting: Family

## 2016-03-05 ENCOUNTER — Encounter: Payer: Self-pay | Admitting: Sports Medicine

## 2016-03-05 ENCOUNTER — Ambulatory Visit (INDEPENDENT_AMBULATORY_CARE_PROVIDER_SITE_OTHER): Payer: BLUE CROSS/BLUE SHIELD | Admitting: Sports Medicine

## 2016-03-05 DIAGNOSIS — L409 Psoriasis, unspecified: Secondary | ICD-10-CM

## 2016-03-05 DIAGNOSIS — E669 Obesity, unspecified: Secondary | ICD-10-CM

## 2016-03-05 DIAGNOSIS — Z23 Encounter for immunization: Secondary | ICD-10-CM

## 2016-03-05 MED ORDER — PHENTERMINE HCL 37.5 MG PO TABS: ORAL_TABLET | ORAL | 0 refills | Status: DC

## 2016-03-05 NOTE — Progress Notes (Signed)
  Subjective:    CC: weight re-check  HPI: 65 yo F presenting for two month weight re-check on phentermine.  She has lost 7 pounds since last visit.  Continues to be happy with her progress and is not interested in starting Topomax at this time.  Only complaint is some constipation, denies heart palpitations.  She does not take her phentermine on Saturday to "give my system time to clear out".  She is on a daily stool softener.  She also has a goal of walking 6,000 steps per day, which she tracks with a Fitbit.  Her next goal is to walk 10,000 steps twice per week.  Her psoriasis is improving with calcipotriene, which she applies twice a day, as well as clobetasol which she uses once per day.    Past medical history, Surgical history, Family history not pertinant except as noted below, Social history, Allergies, and medications have been entered into the medical record, reviewed, and no changes needed.   Review of Systems: No fevers, chills, night sweats, weight loss, chest pain, or shortness of breath.   Objective:    General: Well Developed, well nourished, and in no acute distress.  Neuro: Alert and oriented x3, extra-ocular muscles intact, sensation grossly intact.  HEENT: Normocephalic, atraumatic, pupils equal round reactive to light, neck supple, no masses, no lymphadenopathy, thyroid nonpalpable.  Skin: Warm and dry, no rashes. Cardiac: Regular rate and rhythm, no murmurs rubs or gallops, no lower extremity edema.  Respiratory: Clear to auscultation bilaterally. Not using accessory muscles, speaking in full sentences.   Impression and Recommendations:    1. Obesity: continues to lose weight, now down 7 pounds, on phentermine.  Is not interested in Topomax at this time -Continue phentermine (entering month 3).  Will add Topomax when weight plateaus -Return in 1 month for weight recheck  2. Psoriasis -Responding well to calcipotriene and clobetasol.  Use both in 1:1 ratio twice per  day

## 2016-03-05 NOTE — Assessment & Plan Note (Signed)
Continued good weight loss, we are entering the third month. If she plateaus we will add Topamax, which will help her migraine headaches.

## 2016-03-05 NOTE — Assessment & Plan Note (Signed)
Continue clobetasol and calcipotriene, may mix the 2 together and apply twice a day

## 2016-03-09 ENCOUNTER — Other Ambulatory Visit: Payer: Self-pay | Admitting: Sports Medicine

## 2016-03-09 DIAGNOSIS — E669 Obesity, unspecified: Secondary | ICD-10-CM

## 2016-03-16 ENCOUNTER — Other Ambulatory Visit: Payer: Self-pay | Admitting: Family

## 2016-04-09 ENCOUNTER — Ambulatory Visit: Payer: BLUE CROSS/BLUE SHIELD | Admitting: Sports Medicine

## 2016-04-23 ENCOUNTER — Encounter: Payer: Self-pay | Admitting: Sports Medicine

## 2016-04-23 ENCOUNTER — Ambulatory Visit (INDEPENDENT_AMBULATORY_CARE_PROVIDER_SITE_OTHER): Payer: BLUE CROSS/BLUE SHIELD | Admitting: Sports Medicine

## 2016-04-23 DIAGNOSIS — E7800 Pure hypercholesterolemia, unspecified: Secondary | ICD-10-CM | POA: Diagnosis not present

## 2016-04-23 DIAGNOSIS — E6609 Other obesity due to excess calories: Secondary | ICD-10-CM

## 2016-04-23 MED ORDER — PHENTERMINE HCL 37.5 MG PO TABS: ORAL_TABLET | ORAL | 0 refills | Status: DC

## 2016-04-23 NOTE — Assessment & Plan Note (Addendum)
Additional 8 pound weight loss, entering the fourth month. She has several prescriptions of Topamax but is not yet ready to start. We also discussed adding resistance training

## 2016-04-23 NOTE — Progress Notes (Signed)
  Subjective:    CC:  Follow-up  HPI: Obesity: Additional 8 pound weight loss after 3 months of Phentermine. Has not yet started Topamax.  Lipids:  Would like routine bloodwork ordered.  Past medical history:  Negative.  See flowsheet/record as well for more information.  Surgical history: Negative.  See flowsheet/record as well for more information.  Family history: Negative.  See flowsheet/record as well for more information.  Social history: Negative.  See flowsheet/record as well for more information.  Allergies, and medications have been entered into the medical record, reviewed, and no changes needed.   Review of Systems: No fevers, chills, night sweats, weight loss, chest pain, or shortness of breath.   Objective:    General: Well Developed, well nourished, and in no acute distress.  Neuro: Alert and oriented x3, extra-ocular muscles intact, sensation grossly intact.  HEENT: Normocephalic, atraumatic, pupils equal round reactive to light, neck supple, no masses, no lymphadenopathy, thyroid nonpalpable.  Skin: Warm and dry, no rashes. Cardiac: Regular rate and rhythm, no murmurs rubs or gallops, no lower extremity edema.  Respiratory: Clear to auscultation bilaterally. Not using accessory muscles, speaking in full sentences.  Impression and Recommendations:    Obesity Additional 8 pound weight loss, entering the fourth month. She has several prescriptions of Topamax but is not yet ready to start. We also discussed adding resistance training

## 2016-04-25 ENCOUNTER — Other Ambulatory Visit (INDEPENDENT_AMBULATORY_CARE_PROVIDER_SITE_OTHER): Payer: BLUE CROSS/BLUE SHIELD

## 2016-04-25 DIAGNOSIS — E7800 Pure hypercholesterolemia, unspecified: Secondary | ICD-10-CM | POA: Diagnosis not present

## 2016-04-25 LAB — LIPID PANEL
Cholesterol: 242 mg/dL — AB (ref 0–200)
HDL: 61 mg/dL (ref 35–70)
LDL Cholesterol: 162 mg/dL
Triglycerides: 95 mg/dL (ref 40–160)

## 2016-04-25 LAB — HEMOGLOBIN A1C: Hemoglobin A1C: 5.9

## 2016-04-25 LAB — HEPATIC FUNCTION PANEL
ALT: 24 U/L (ref 7–35)
AST: 26 U/L (ref 13–35)
Alkaline Phosphatase: 113 U/L (ref 25–125)
Bilirubin, Total: 0.6 mg/dL

## 2016-04-25 LAB — CBC AND DIFFERENTIAL
HEMATOCRIT: 42 % (ref 36–46)
HEMOGLOBIN: 13.4 g/dL (ref 12.0–16.0)
Platelets: 312 10*3/uL (ref 150–399)
WBC: 4.7 10*3/mL

## 2016-04-25 LAB — BASIC METABOLIC PANEL
BUN: 15 mg/dL (ref 4–21)
Creatinine: 0.7 mg/dL (ref 0.5–1.1)
Glucose: 104 mg/dL
Potassium: 4.3 mmol/L (ref 3.4–5.3)
Sodium: 143 mmol/L (ref 137–147)

## 2016-05-10 ENCOUNTER — Other Ambulatory Visit: Payer: Self-pay | Admitting: Sports Medicine

## 2016-05-10 ENCOUNTER — Other Ambulatory Visit: Payer: Self-pay | Admitting: Family Medicine

## 2016-05-10 DIAGNOSIS — E6609 Other obesity due to excess calories: Secondary | ICD-10-CM

## 2016-05-17 ENCOUNTER — Encounter: Payer: Self-pay | Admitting: Sports Medicine

## 2016-05-21 ENCOUNTER — Ambulatory Visit: Payer: BLUE CROSS/BLUE SHIELD | Admitting: Sports Medicine

## 2016-06-04 ENCOUNTER — Ambulatory Visit: Payer: BLUE CROSS/BLUE SHIELD | Admitting: Sports Medicine

## 2016-06-04 ENCOUNTER — Ambulatory Visit (INDEPENDENT_AMBULATORY_CARE_PROVIDER_SITE_OTHER): Payer: BLUE CROSS/BLUE SHIELD | Admitting: Sports Medicine

## 2016-06-04 ENCOUNTER — Encounter: Payer: Self-pay | Admitting: Sports Medicine

## 2016-06-04 DIAGNOSIS — E6609 Other obesity due to excess calories: Secondary | ICD-10-CM | POA: Diagnosis not present

## 2016-06-04 DIAGNOSIS — E669 Obesity, unspecified: Secondary | ICD-10-CM

## 2016-06-04 MED ORDER — PHENTERMINE HCL 37.5 MG PO TABS: ORAL_TABLET | ORAL | 0 refills | Status: DC

## 2016-06-04 NOTE — Assessment & Plan Note (Signed)
Good continued weight loss, 9 additional pounds. We are entering the fifth month.

## 2016-06-04 NOTE — Progress Notes (Signed)
  Subjective:    CC: Weight check  HPI: Angela Curry is a pleasant 65 year old female, after 4 months of phentermine she has lost an additional 9 pounds to a total of over 35 pounds. Happy with her results so far And eager to continue.  Past medical history:  Negative.  See flowsheet/record as well for more information.  Surgical history: Negative.  See flowsheet/record as well for more information.  Family history: Negative.  See flowsheet/record as well for more information.  Social history: Negative.  See flowsheet/record as well for more information.  Allergies, and medications have been entered into the medical record, reviewed, and no changes needed.   Review of Systems: No fevers, chills, night sweats, weight loss, chest pain, or shortness of breath.   Objective:    General: Well Developed, well nourished, and in no acute distress.  Neuro: Alert and oriented x3, extra-ocular muscles intact, sensation grossly intact.  HEENT: Normocephalic, atraumatic, pupils equal round reactive to light, neck supple, no masses, no lymphadenopathy, thyroid nonpalpable.  Skin: Warm and dry, no rashes. Cardiac: Regular rate and rhythm, no murmurs rubs or gallops, no lower extremity edema.  Respiratory: Clear to auscultation bilaterally. Not using accessory muscles, speaking in full sentences.  Impression and Recommendations:    Obesity Good continued weight loss, 9 additional pounds. We are entering the fifth month.

## 2016-06-14 ENCOUNTER — Other Ambulatory Visit: Payer: Self-pay | Admitting: Family

## 2016-06-14 ENCOUNTER — Other Ambulatory Visit: Payer: Self-pay | Admitting: Sports Medicine

## 2016-06-14 DIAGNOSIS — E6609 Other obesity due to excess calories: Secondary | ICD-10-CM

## 2016-06-18 ENCOUNTER — Other Ambulatory Visit: Payer: Self-pay | Admitting: Family

## 2016-07-12 ENCOUNTER — Other Ambulatory Visit: Payer: Self-pay | Admitting: Family Medicine

## 2016-07-12 ENCOUNTER — Other Ambulatory Visit: Payer: Self-pay | Admitting: Sports Medicine

## 2016-07-12 DIAGNOSIS — E6609 Other obesity due to excess calories: Secondary | ICD-10-CM

## 2016-08-22 ENCOUNTER — Ambulatory Visit (INDEPENDENT_AMBULATORY_CARE_PROVIDER_SITE_OTHER): Payer: BLUE CROSS/BLUE SHIELD | Admitting: Family Medicine

## 2016-08-22 ENCOUNTER — Encounter: Payer: Self-pay | Admitting: Family Medicine

## 2016-08-22 VITALS — BP 111/69 | HR 82 | Temp 97.1°F | Ht 62.5 in | Wt 178.2 lb

## 2016-08-22 DIAGNOSIS — R0981 Nasal congestion: Secondary | ICD-10-CM

## 2016-08-22 DIAGNOSIS — Z713 Dietary counseling and surveillance: Secondary | ICD-10-CM

## 2016-08-22 DIAGNOSIS — E669 Obesity, unspecified: Secondary | ICD-10-CM | POA: Diagnosis not present

## 2016-08-22 MED ORDER — VALACYCLOVIR HCL 1 G PO TABS: 1000.0000 mg | ORAL_TABLET | Freq: Two times a day (BID) | ORAL | 11 refills | Status: DC

## 2016-08-22 MED ORDER — LUBIPROSTONE 24 MCG PO CAPS: 24.0000 ug | ORAL_CAPSULE | Freq: Two times a day (BID) | ORAL | 2 refills | Status: DC

## 2016-08-22 MED ORDER — CONJ ESTROG-MEDROXYPROGEST ACE 0.3-1.5 MG PO TABS: 1.0000 | ORAL_TABLET | Freq: Every day | ORAL | 6 refills | Status: DC

## 2016-08-22 MED ORDER — CLOBETASOL PROPIONATE 0.05 % EX OINT: 1.0000 "application " | TOPICAL_OINTMENT | Freq: Every day | CUTANEOUS | 5 refills | Status: DC

## 2016-08-22 MED ORDER — FLUTICASONE PROPIONATE 50 MCG/ACT NA SUSP: 2.0000 | Freq: Every day | NASAL | 11 refills | Status: DC

## 2016-08-22 MED ORDER — NALTREXONE-BUPROPION HCL ER 8-90 MG PO TB12: 2.0000 | ORAL_TABLET | Freq: Two times a day (BID) | ORAL | 2 refills | Status: DC

## 2016-08-22 MED ORDER — NALTREXONE-BUPROPION HCL ER 8-90 MG PO TB12: ORAL_TABLET | ORAL | 0 refills | Status: DC

## 2016-08-22 MED ORDER — DEXLANSOPRAZOLE 30 MG PO CPDR: 30.0000 mg | DELAYED_RELEASE_CAPSULE | Freq: Every day | ORAL | 3 refills | Status: DC

## 2016-08-22 NOTE — Progress Notes (Signed)
BP 111/69   Pulse 82   Temp 97.1 F (36.2 C) (Oral)   Ht 5' 2.5" (1.588 m)   Wt 178 lb 3.2 oz (80.8 kg)   BMI 32.07 kg/m    Subjective:    Patient ID: Angela Curry, female    DOB: [birth year], 66 y.o.   MRN: [id number]  HPI: Angela Curry is a 66 y.o. female presenting on 08/22/2016 for Medication Refill; Ear Fullness (for last month); and Weight Loss (was going to [employer] may want to do maintenance here since closer)   HPI Weight loss management and obesity Patient is coming in for weight loss management. She has been seen in clinic for weight loss management where she was given phentermine but is coming here today because she is now done with the phentermine and they told her she needed to be on something else for maintenance and she was given options of a left trimalleolar the options. We discussed the benefits and risks of each one and she would like to try contrary multilevel she is also been making dietary and lifestyle changes and trying to eat healthier and plans to get back out into an exercise regimen now that the weather is starting to warm up. She lost a total of 30 pounds over the past 6 months while being on phentermine.  Sinus congestion and ear congestion Patient has been having sinus condition ear condition is been going on for the pastFew Curry. She has had ear congestion for the past month and has been intermittently fighting this. She has tried some over-the-counter Tylenol Sinus cold medication. It does not seem to be helping much. She denies any fevers or chills or shortness of breath or wheezing.  Relevant past medical, surgical, family and social history reviewed and updated as indicated. Interim medical history since our last visit reviewed. Allergies and medications reviewed and updated.  Review of Systems  Constitutional: Negative for chills and fever.  HENT: Positive for congestion, ear pain, postnasal drip, rhinorrhea, sinus  pressure, sneezing and sore throat. Negative for ear discharge.   Eyes: Negative for pain, redness and visual disturbance.  Respiratory: Positive for cough. Negative for chest tightness and shortness of breath.   Cardiovascular: Negative for chest pain and leg swelling.  Genitourinary: Negative for difficulty urinating and dysuria.  Musculoskeletal: Negative for back pain and gait problem.  Skin: Negative for rash.  Neurological: Negative for light-headedness and headaches.  Psychiatric/Behavioral: Negative for agitation and behavioral problems.  All other systems reviewed and are negative.   Per HPI unless specifically indicated above     Objective:    BP 111/69   Pulse 82   Temp 97.1 F (36.2 C) (Oral)   Ht 5' 2.5" (1.588 m)   Wt 178 lb 3.2 oz (80.8 kg)   BMI 32.07 kg/m   Wt Readings from Last 3 Encounters:  08/22/16 178 lb 3.2 oz (80.8 kg)  06/04/16 175 lb 14.4 oz (79.8 kg)  04/23/16 184 lb 1.6 oz (83.5 kg)    Physical Exam  Constitutional: She is oriented to person, place, and time. She appears well-developed and well-nourished. No distress.  Morbidly obese with centripetal obesity  HENT:  Right Ear: Tympanic membrane, external ear and ear canal normal.  Left Ear: Tympanic membrane, external ear and ear canal normal.  Nose: Mucosal edema and rhinorrhea present. No epistaxis. Right sinus exhibits no maxillary sinus tenderness and no frontal sinus tenderness. Left sinus exhibits no maxillary sinus tenderness and no  frontal sinus tenderness.  Mouth/Throat: Uvula is midline and mucous membranes are normal. Posterior oropharyngeal edema and posterior oropharyngeal erythema present. No oropharyngeal exudate or tonsillar abscesses.  Eyes: Conjunctivae are normal.  Neck: Neck supple. No thyromegaly present.  Cardiovascular: Normal rate, regular rhythm, normal heart sounds and intact distal pulses.   No murmur heard. Pulmonary/Chest: Effort normal and breath sounds normal. No  respiratory distress. She has no wheezes. She has no rales.  Musculoskeletal: Normal range of motion. She exhibits no edema or tenderness.  Lymphadenopathy:    She has no cervical adenopathy.  Neurological: She is alert and oriented to person, place, and time. Coordination normal.  Skin: Skin is warm and dry. No rash noted. She is not diaphoretic.  Psychiatric: She has a normal mood and affect. Her behavior is normal.  Vitals reviewed.     Assessment & Plan:   Problem List Items Addressed This Visit      Other   Obesity   Relevant Medications   Naltrexone-Bupropion HCl ER (CONTRAVE) 8-90 MG TB12   Naltrexone-Bupropion HCl ER (CONTRAVE) 8-90 MG TB12    Other Visit Diagnoses    Sinus congestion    -  Primary   Recommend to use Flonase for the pressure in the ears and it will help clear out.   Weight loss counseling, encounter for       Relevant Medications   Naltrexone-Bupropion HCl ER (CONTRAVE) 8-90 MG TB12   Naltrexone-Bupropion HCl ER (CONTRAVE) 8-90 MG TB12       Follow up plan: Return in about 2 months (around 10/20/2016), or if symptoms worsen or fail to improve, for Weight loss recheck.  Counseling provided for all of the vaccine components No orders of the defined types were placed in this encounter.   Caryl Pina, MD [facility] 08/22/2016, 11:20 AM

## 2016-10-17 ENCOUNTER — Ambulatory Visit (INDEPENDENT_AMBULATORY_CARE_PROVIDER_SITE_OTHER): Payer: BLUE CROSS/BLUE SHIELD | Admitting: Family Medicine

## 2016-10-17 ENCOUNTER — Encounter: Payer: Self-pay | Admitting: Family Medicine

## 2016-10-17 VITALS — BP 127/77 | HR 76 | Temp 97.7°F | Ht 62.5 in | Wt 178.0 lb

## 2016-10-17 DIAGNOSIS — Z6832 Body mass index (BMI) 32.0-32.9, adult: Secondary | ICD-10-CM

## 2016-10-17 DIAGNOSIS — E669 Obesity, unspecified: Secondary | ICD-10-CM

## 2016-10-17 DIAGNOSIS — Z713 Dietary counseling and surveillance: Secondary | ICD-10-CM | POA: Diagnosis not present

## 2016-10-17 DIAGNOSIS — E7800 Pure hypercholesterolemia, unspecified: Secondary | ICD-10-CM

## 2016-10-17 MED ORDER — NALTREXONE-BUPROPION HCL ER 8-90 MG PO TB12: 2.0000 | ORAL_TABLET | Freq: Two times a day (BID) | ORAL | 2 refills | Status: DC

## 2016-10-17 NOTE — Progress Notes (Signed)
BP 127/77   Pulse 76   Temp 97.7 F (36.5 C) (Oral)   Ht 5' 2.5" (1.588 m)   Wt 178 lb (80.7 kg)   BMI 32.04 kg/m    Subjective:    Patient ID: Angela Curry, female    DOB: [birth year], 66 y.o.   MRN: [id number]  HPI: Angela Curry is a 66 y.o. female presenting on 10/17/2016 for Hyperlipidemia (followup, patient is not fasting) and Weight Loss (followup, patient has been on Contrave for 3 weeks, has lost 30 pounds total since July 2017)   HPI Hyperlipidemia Patient is coming in for recheck of his hyperlipidemia. He is currently taking diet controlled. He denies any issues with myalgias or history of liver damage from it. He denies any focal numbness or weakness or chest pain.  Obesity and weight loss management  Patient is coming in for weight loss management. She weighed 178 with our last time and she just started to contract 3 weeks ago. She says she is doing well on it denies any side effects from it. She said initially it did cause her $50 but now she found a coupon that should lower the price for her. She denies any constipation or diarrhea. She denies any abdominal pain.  Relevant past medical, surgical, family and social history reviewed and updated as indicated. Interim medical history since our last visit reviewed. Allergies and medications reviewed and updated.  Review of Systems  Constitutional: Negative for chills and fever.  Respiratory: Negative for chest tightness and shortness of breath.   Cardiovascular: Negative for chest pain and leg swelling.  Gastrointestinal: Negative for abdominal pain, constipation, diarrhea, nausea and vomiting.  Musculoskeletal: Negative for back pain and gait problem.  Skin: Negative for rash.  Neurological: Negative for light-headedness and headaches.  Psychiatric/Behavioral: Negative for agitation and behavioral problems.  All other systems reviewed and are negative.   Per HPI unless specifically indicated above    Objective:    BP 127/77   Pulse 76   Temp 97.7 F (36.5 C) (Oral)   Ht 5' 2.5" (1.588 m)   Wt 178 lb (80.7 kg)   BMI 32.04 kg/m   Wt Readings from Last 3 Encounters:  10/17/16 178 lb (80.7 kg)  08/22/16 178 lb 3.2 oz (80.8 kg)  06/04/16 175 lb 14.4 oz (79.8 kg)    Physical Exam  Constitutional: She is oriented to person, place, and time. She appears well-developed and well-nourished. No distress.  Eyes: Conjunctivae are normal.  Neck: Neck supple. No thyromegaly present.  Cardiovascular: Normal rate, regular rhythm, normal heart sounds and intact distal pulses.   No murmur heard. Pulmonary/Chest: Effort normal and breath sounds normal. No respiratory distress. She has no wheezes. She has no rales.  Musculoskeletal: Normal range of motion. She exhibits no edema or tenderness.  Lymphadenopathy:    She has no cervical adenopathy.  Neurological: She is alert and oriented to person, place, and time. Coordination normal.  Skin: Skin is warm and dry. No rash noted. She is not diaphoretic.  Psychiatric: She has a normal mood and affect. Her behavior is normal.  Nursing note and vitals reviewed.     Assessment & Plan:   Problem List Items Addressed This Visit      Other   Elevated cholesterol - Primary   Relevant Orders   Lipid panel   Obesity   Relevant Medications   Naltrexone-Bupropion HCl ER (CONTRAVE) 8-90 MG TB12    Other Visit Diagnoses  Weight loss counseling, encounter for       Relevant Medications   Naltrexone-Bupropion HCl ER (CONTRAVE) 8-90 MG TB12      Follow up plan: Return in about 3 months (around 01/16/2017), or if symptoms worsen or fail to improve, for Weight recheck, obesity, patient also needs to schedule mammogram.  Counseling provided for all of the vaccine components Orders Placed This Encounter  Procedures  . Lipid panel    Caryl Pina, MD [facility] 10/17/2016, 11:14 AM

## 2016-12-02 DIAGNOSIS — H903 Sensorineural hearing loss, bilateral: Secondary | ICD-10-CM | POA: Diagnosis not present

## 2016-12-02 DIAGNOSIS — H6122 Impacted cerumen, left ear: Secondary | ICD-10-CM | POA: Diagnosis not present

## 2017-01-20 ENCOUNTER — Ambulatory Visit (INDEPENDENT_AMBULATORY_CARE_PROVIDER_SITE_OTHER): Payer: BLUE CROSS/BLUE SHIELD | Admitting: Family

## 2017-01-20 ENCOUNTER — Encounter: Payer: Self-pay | Admitting: Family

## 2017-01-20 VITALS — BP 123/87 | HR 71 | Temp 96.9°F | Ht 62.5 in | Wt 175.8 lb

## 2017-01-20 DIAGNOSIS — Z713 Dietary counseling and surveillance: Secondary | ICD-10-CM | POA: Diagnosis not present

## 2017-01-20 DIAGNOSIS — E669 Obesity, unspecified: Secondary | ICD-10-CM

## 2017-01-20 DIAGNOSIS — Z6832 Body mass index (BMI) 32.0-32.9, adult: Secondary | ICD-10-CM | POA: Diagnosis not present

## 2017-01-20 DIAGNOSIS — Z1211 Encounter for screening for malignant neoplasm of colon: Secondary | ICD-10-CM

## 2017-01-20 DIAGNOSIS — Z6831 Body mass index (BMI) 31.0-31.9, adult: Secondary | ICD-10-CM | POA: Diagnosis not present

## 2017-01-20 MED ORDER — NALTREXONE-BUPROPION HCL ER 8-90 MG PO TB12: 2.0000 | ORAL_TABLET | Freq: Two times a day (BID) | ORAL | 2 refills | Status: DC

## 2017-01-20 NOTE — Progress Notes (Signed)
   Subjective:    Patient ID: Angela Curry, female    DOB: Dec 13, 1950, 66 y.o.   MRN: [id number]  HPI Pt presents to the office today for weight loss management. PT currently taking Contrave daily and has lost 35 lb since June 17. PT has lost 3lb since her last visits. PT states she has maintained her weight loss and would like to lose another 15 lbs. She would like to continue the Contrave. PT states she walks for 30 min for three times a week.     Review of Systems  All other systems reviewed and are negative.      Objective:   Physical Exam  Constitutional: She is oriented to person, place, and time. She appears well-developed and well-nourished. No distress.  HENT:  Head: Normocephalic and atraumatic.  Eyes: Pupils are equal, round, and reactive to light.  Neck: Normal range of motion. Neck supple. No thyromegaly present.  Cardiovascular: Normal rate, regular rhythm, normal heart sounds and intact distal pulses.   No murmur heard. Pulmonary/Chest: Effort normal and breath sounds normal. No respiratory distress. She has no wheezes.  Abdominal: Soft. Bowel sounds are normal. She exhibits no distension. There is no tenderness.  Musculoskeletal: Normal range of motion. She exhibits no edema or tenderness.  Neurological: She is alert and oriented to person, place, and time.  Skin: Skin is warm and dry.  Psychiatric: She has a normal mood and affect. Her behavior is normal. Judgment and thought content normal.  Vitals reviewed.     BP 123/87   Pulse 71   Temp (!) 96.9 F (36.1 C) (Oral)   Ht 5' 2.5" (1.588 m)   Wt 175 lb 12.8 oz (79.7 kg)   BMI 31.64 kg/m      Assessment & Plan:  1. Weight loss counseling, encounter for Encourage exercise and diet RTO in 3 months  - CMP14+EGFR - Naltrexone-Bupropion HCl ER (CONTRAVE) 8-90 MG TB12; Take 2 tablets by mouth 2 (two) times daily.  Dispense: 120 tablet; Refill: 2  2. Class 1 obesity with body mass index (BMI) of  31.0 to 31.9 in adult, unspecified obesity type, unspecified whether serious comorbidity present - CMP14+EGFR - Naltrexone-Bupropion HCl ER (CONTRAVE) 8-90 MG TB12; Take 2 tablets by mouth 2 (two) times daily.  Dispense: 120 tablet; Refill: 2  3. Colon cancer screening - CMP14+EGFR - Fecal occult blood, imunochemical; Future    [name], FNP

## 2017-01-20 NOTE — Patient Instructions (Signed)
Exercising to Lose Weight Exercising can help you to lose weight. In order to lose weight through exercise, you need to do vigorous-intensity exercise. You can tell that you are exercising with vigorous intensity if you are breathing very hard and fast and cannot hold a conversation while exercising. Moderate-intensity exercise helps to maintain your current weight. You can tell that you are exercising at a moderate level if you have a higher heart rate and faster breathing, but you are still able to hold a conversation. How often should I exercise? Choose an activity that you enjoy and set realistic goals. Your health care provider can help you to make an activity plan that works for you. Exercise regularly as directed by your health care provider. This may include:  Doing resistance training twice each week, such as: ? Push-ups. ? Sit-ups. ? Lifting weights. ? Using resistance bands.  Doing a given intensity of exercise for a given amount of time. Choose from these options: ? 150 minutes of moderate-intensity exercise every week. ? 75 minutes of vigorous-intensity exercise every week. ? A mix of moderate-intensity and vigorous-intensity exercise every week.  Children, pregnant women, people who are out of shape, people who are overweight, and older adults may need to consult a health care provider for individual recommendations. If you have any sort of medical condition, be sure to consult your health care provider before starting a new exercise program. What are some activities that can help me to lose weight?  Walking at a rate of at least 4.5 miles an hour.  Jogging or running at a rate of 5 miles per hour.  Biking at a rate of at least 10 miles per hour.  Lap swimming.  Roller-skating or in-line skating.  Cross-country skiing.  Vigorous competitive sports, such as football, basketball, and soccer.  Jumping rope.  Aerobic dancing. How can I be more active in my day-to-day  activities?  Use the stairs instead of the elevator.  Take a walk during your lunch break.  If you drive, park your car farther away from work or school.  If you take public transportation, get off one stop early and walk the rest of the way.  Make all of your phone calls while standing up and walking around.  Get up, stretch, and walk around every 30 minutes throughout the day. What guidelines should I follow while exercising?  Do not exercise so much that you hurt yourself, feel dizzy, or get very short of breath.  Consult your health care provider prior to starting a new exercise program.  Wear comfortable clothes and shoes with good support.  Drink plenty of water while you exercise to prevent dehydration or heat stroke. Body water is lost during exercise and must be replaced.  Work out until you breathe faster and your heart beats faster. This information is not intended to replace advice given to you by your health care provider. Make sure you discuss any questions you have with your health care provider. Document Released: 08/03/2010 Document Revised: 12/07/2015 Document Reviewed: 12/02/2013 Elsevier Interactive Patient Education  2018 [employer] Inc[employer]  

## 2017-01-21 LAB — CMP14+EGFR
ALT: 16 IU/L (ref 0–32)
AST: 13 IU/L (ref 0–40)
Albumin/Globulin Ratio: 1.7 (ref 1.2–2.2)
Albumin: 4.2 g/dL (ref 3.6–4.8)
Alkaline Phosphatase: 114 IU/L (ref 39–117)
BUN/Creatinine Ratio: 19 (ref 12–28)
BUN: 13 mg/dL (ref 8–27)
Bilirubin Total: 0.3 mg/dL (ref 0.0–1.2)
CALCIUM: 9.5 mg/dL (ref 8.7–10.3)
CO2: 24 mmol/L (ref 20–29)
Chloride: 104 mmol/L (ref 96–106)
Creatinine, Ser: 0.7 mg/dL (ref 0.57–1.00)
GFR, EST AFRICAN AMERICAN: 106 mL/min/{1.73_m2} (ref >59)
GFR, EST NON AFRICAN AMERICAN: 92 mL/min/{1.73_m2} (ref >59)
GLUCOSE: 102 mg/dL — AB (ref 65–99)
Globulin, Total: 2.5 g/dL (ref 1.5–4.5)
Potassium: 4.6 mmol/L (ref 3.5–5.2)
Sodium: 144 mmol/L (ref 134–144)
TOTAL PROTEIN: 6.7 g/dL (ref 6.0–8.5)

## 2017-02-04 ENCOUNTER — Ambulatory Visit (INDEPENDENT_AMBULATORY_CARE_PROVIDER_SITE_OTHER): Payer: BLUE CROSS/BLUE SHIELD | Admitting: Family

## 2017-02-04 ENCOUNTER — Encounter: Payer: Self-pay | Admitting: Family

## 2017-02-04 VITALS — BP 117/81 | HR 92 | Temp 97.0°F | Ht 62.5 in | Wt 175.2 lb

## 2017-02-04 DIAGNOSIS — J019 Acute sinusitis, unspecified: Secondary | ICD-10-CM

## 2017-02-04 MED ORDER — AMOXICILLIN-POT CLAVULANATE 875-125 MG PO TABS: 1.0000 | ORAL_TABLET | Freq: Two times a day (BID) | ORAL | 0 refills | Status: DC

## 2017-02-04 MED ORDER — FLUTICASONE PROPIONATE 50 MCG/ACT NA SUSP
2.0000 | Freq: Every day | NASAL | 11 refills | Status: DC
Start: 2017-02-04 — End: 2020-06-14

## 2017-02-04 NOTE — Progress Notes (Signed)
   Subjective:    Patient ID: Angela Curry, female    DOB: 03-Jan-1951, 66 y.o.   MRN: [id number]  Sinus Problem  The current episode started 1 to 4 weeks ago. The problem has been gradually worsening since onset. There has been no fever. Her pain is at a severity of 7/10. The pain is moderate. Associated symptoms include ear pain, headaches, a hoarse voice, neck pain, sinus pressure and swollen glands. Pertinent negatives include no congestion, coughing, sneezing or sore throat. Past treatments include acetaminophen and oral decongestants. The treatment provided mild relief.      Review of Systems  HENT: Positive for ear pain, hoarse voice and sinus pressure. Negative for congestion, sneezing and sore throat.   Respiratory: Negative for cough.   Musculoskeletal: Positive for neck pain.  Neurological: Positive for headaches.  All other systems reviewed and are negative.      Objective:   Physical Exam  Constitutional: She is oriented to person, place, and time. She appears well-developed and well-nourished. No distress.  HENT:  Head: Normocephalic and atraumatic.  Right Ear: External ear normal.  Left Ear: External ear normal. A middle ear effusion is present.  Nose: Mucosal edema and rhinorrhea present. Right sinus exhibits maxillary sinus tenderness. Left sinus exhibits maxillary sinus tenderness.  Mouth/Throat: Posterior oropharyngeal erythema present.  Eyes: Pupils are equal, round, and reactive to light.  Neck: Normal range of motion. Neck supple. No thyromegaly present.  Cardiovascular: Normal rate, regular rhythm, normal heart sounds and intact distal pulses.   No murmur heard. Pulmonary/Chest: Effort normal and breath sounds normal. No respiratory distress. She has no wheezes.  Abdominal: Soft. Bowel sounds are normal. She exhibits no distension. There is no tenderness.  Musculoskeletal: Normal range of motion. She exhibits no edema or tenderness.  Neurological: She  is alert and oriented to person, place, and time. She has normal reflexes. No cranial nerve deficit.  Skin: Skin is warm and dry.  Psychiatric: She has a normal mood and affect. Her behavior is normal. Judgment and thought content normal.  Vitals reviewed.     BP 117/81   Pulse 92   Temp (!) 97 F (36.1 C) (Oral)   Ht 5' 2.5" (1.588 m)   Wt 175 lb 3.2 oz (79.5 kg)   BMI 31.53 kg/m      Assessment & Plan:  1. Acute sinusitis, recurrence not specified, unspecified location - Take meds as prescribed - Use a cool mist humidifier  -Use saline nose sprays frequently -Saline irrigations of the nose can be very helpful if done frequently.  * 4X daily for 1 week*  * Use of a nettie pot can be helpful with this. Follow directions with this* -Force fluids -For any cough or congestion  Use plain Mucinex- regular strength or max strength is fine   * Children- consult with Pharmacist for dosing -For fever or aces or pains- take tylenol or ibuprofen appropriate for age and weight.  * for fevers greater than 101 orally you may alternate ibuprofen and tylenol every  3 hours. -Throat lozenges if help - amoxicillin-clavulanate (AUGMENTIN) 875-125 MG tablet; Take 1 tablet by mouth 2 (two) times daily.  Dispense: 14 tablet; Refill: 0 - fluticasone (FLONASE) 50 MCG/ACT nasal spray; Place 2 sprays into both nostrils daily.  Dispense: 16 g; Refill: [place], FNP

## 2017-02-04 NOTE — Patient Instructions (Signed)

## 2017-02-24 ENCOUNTER — Encounter: Payer: Self-pay | Admitting: Sports Medicine

## 2017-02-24 ENCOUNTER — Telehealth: Payer: Self-pay | Admitting: Sports Medicine

## 2017-02-24 NOTE — Telephone Encounter (Signed)
This is a pleasant 66 year old female that I saw for a right navicular stress fracture last year. She did well about a year ago but unfortunately is starting to have a recurrence of pain in the same location at the medial right ankle consistent with distal tibialis posterior tendinopathy versus navicular stress fracture. Pain is exacerbated with weightbearing, as well as with resisted inversion of the ankle. Because this likely represents a stress fracture, she will need to be nonweightbearing for 6 weeks. I would allow touchdown weightbearing in a boot. She will keep follow-up with me in 6 weeks.  Unfortunately the patient does have an upcoming trip, it is my medical opinion that she is not fit to travel at this time. I do suspect 6 weeks before she is fit to travel.

## 2017-03-12 ENCOUNTER — Encounter: Payer: BLUE CROSS/BLUE SHIELD | Admitting: *Deleted

## 2017-03-12 DIAGNOSIS — Z1231 Encounter for screening mammogram for malignant neoplasm of breast: Secondary | ICD-10-CM | POA: Diagnosis not present

## 2017-04-24 ENCOUNTER — Encounter: Payer: Self-pay | Admitting: Family

## 2017-04-24 ENCOUNTER — Ambulatory Visit (INDEPENDENT_AMBULATORY_CARE_PROVIDER_SITE_OTHER): Payer: BLUE CROSS/BLUE SHIELD | Admitting: Family

## 2017-04-24 VITALS — BP 129/86 | HR 84 | Temp 97.7°F | Ht 62.5 in | Wt 180.8 lb

## 2017-04-24 DIAGNOSIS — R635 Abnormal weight gain: Secondary | ICD-10-CM | POA: Diagnosis not present

## 2017-04-24 DIAGNOSIS — Z713 Dietary counseling and surveillance: Secondary | ICD-10-CM | POA: Diagnosis not present

## 2017-04-24 DIAGNOSIS — Z6831 Body mass index (BMI) 31.0-31.9, adult: Secondary | ICD-10-CM | POA: Diagnosis not present

## 2017-04-24 DIAGNOSIS — E669 Obesity, unspecified: Secondary | ICD-10-CM | POA: Diagnosis not present

## 2017-04-24 DIAGNOSIS — Z23 Encounter for immunization: Secondary | ICD-10-CM

## 2017-04-24 LAB — CMP14+EGFR
ALBUMIN: 4.3 g/dL (ref 3.6–4.8)
ALT: 16 IU/L (ref 0–32)
AST: 15 IU/L (ref 0–40)
Albumin/Globulin Ratio: 1.9 (ref 1.2–2.2)
Alkaline Phosphatase: 105 IU/L (ref 39–117)
BUN / CREAT RATIO: 18 (ref 12–28)
BUN: 13 mg/dL (ref 8–27)
Bilirubin Total: 0.4 mg/dL (ref 0.0–1.2)
CALCIUM: 9.3 mg/dL (ref 8.7–10.3)
CO2: 25 mmol/L (ref 20–29)
CREATININE: 0.73 mg/dL (ref 0.57–1.00)
Chloride: 105 mmol/L (ref 96–106)
GFR, EST AFRICAN AMERICAN: 101 mL/min/{1.73_m2} (ref >59)
GFR, EST NON AFRICAN AMERICAN: 87 mL/min/{1.73_m2} (ref >59)
GLUCOSE: 100 mg/dL — AB (ref 65–99)
Globulin, Total: 2.3 g/dL (ref 1.5–4.5)
Potassium: 4.6 mmol/L (ref 3.5–5.2)
Sodium: 142 mmol/L (ref 134–144)
TOTAL PROTEIN: 6.6 g/dL (ref 6.0–8.5)

## 2017-04-24 MED ORDER — PHENTERMINE HCL 37.5 MG PO TABS: 37.5000 mg | ORAL_TABLET | Freq: Every day | ORAL | 2 refills | Status: DC

## 2017-04-24 NOTE — Patient Instructions (Signed)
Exercising to Lose Weight Exercising can help you to lose weight. In order to lose weight through exercise, you need to do vigorous-intensity exercise. You can tell that you are exercising with vigorous intensity if you are breathing very hard and fast and cannot hold a conversation while exercising. Moderate-intensity exercise helps to maintain your current weight. You can tell that you are exercising at a moderate level if you have a higher heart rate and faster breathing, but you are still able to hold a conversation. How often should I exercise? Choose an activity that you enjoy and set realistic goals. Your health care provider can help you to make an activity plan that works for you. Exercise regularly as directed by your health care provider. This may include:  Doing resistance training twice each week, such as: ? Push-ups. ? Sit-ups. ? Lifting weights. ? Using resistance bands.  Doing a given intensity of exercise for a given amount of time. Choose from these options: ? 150 minutes of moderate-intensity exercise every week. ? 75 minutes of vigorous-intensity exercise every week. ? A mix of moderate-intensity and vigorous-intensity exercise every week.  Children, pregnant women, people who are out of shape, people who are overweight, and older adults may need to consult a health care provider for individual recommendations. If you have any sort of medical condition, be sure to consult your health care provider before starting a new exercise program. What are some activities that can help me to lose weight?  Walking at a rate of at least 4.5 miles an hour.  Jogging or running at a rate of 5 miles per hour.  Biking at a rate of at least 10 miles per hour.  Lap swimming.  Roller-skating or in-line skating.  Cross-country skiing.  Vigorous competitive sports, such as football, basketball, and soccer.  Jumping rope.  Aerobic dancing. How can I be more active in my day-to-day  activities?  Use the stairs instead of the elevator.  Take a walk during your lunch break.  If you drive, park your car farther away from work or school.  If you take public transportation, get off one stop early and walk the rest of the way.  Make all of your phone calls while standing up and walking around.  Get up, stretch, and walk around every 30 minutes throughout the day. What guidelines should I follow while exercising?  Do not exercise so much that you hurt yourself, feel dizzy, or get very short of breath.  Consult your health care provider prior to starting a new exercise program.  Wear comfortable clothes and shoes with good support.  Drink plenty of water while you exercise to prevent dehydration or heat stroke. Body water is lost during exercise and must be replaced.  Work out until you breathe faster and your heart beats faster. This information is not intended to replace advice given to you by your health care provider. Make sure you discuss any questions you have with your health care provider. Document Released: 08/03/2010 Document Revised: 12/07/2015 Document Reviewed: 12/02/2013 Elsevier Interactive Patient Education  2018 [employer] Inc[employer]  

## 2017-04-24 NOTE — Progress Notes (Signed)
   Subjective:    Patient ID: Angela Curry, female    DOB: 05-30-1951, 66 y.o.   MRN: [id number]   HPI  PT presents to the office today for weight loss management. PT has been  taking Contrave since April, but states she felt "foggy headed" and decreased her dose daily instead of BID. She reports that she had lost 5 lbs, but has gained it back.   Pt requesting to try phentermine. Pt states she had taken this 07/17-12/17 and lost 35 lbs.  Pt states June 2017 she weighed 210 lbs.   Review of Systems  All other systems reviewed and are negative.      Objective:   Physical Exam  Constitutional: She is oriented to person, place, and time. She appears well-developed and well-nourished. No distress.  Obese   HENT:  Head: Normocephalic and atraumatic.  Right Ear: External ear normal.  Left Ear: External ear normal.  Nose: Nose normal.  Mouth/Throat: Oropharynx is clear and moist.  Eyes: Pupils are equal, round, and reactive to light.  Neck: Normal range of motion. Neck supple. No thyromegaly present.  Cardiovascular: Normal rate, regular rhythm, normal heart sounds and intact distal pulses.   No murmur heard. Pulmonary/Chest: Effort normal and breath sounds normal. No respiratory distress. She has no wheezes.  Abdominal: Soft. Bowel sounds are normal. She exhibits no distension. There is no tenderness.  Musculoskeletal: Normal range of motion. She exhibits no edema or tenderness.  Neurological: She is alert and oriented to person, place, and time.  Skin: Skin is warm and dry.  Psychiatric: She has a normal mood and affect. Her behavior is normal. Judgment and thought content normal.  Vitals reviewed.     BP 129/86   Pulse 84   Temp 97.7 F (36.5 C) (Oral)   Ht 5' 2.5" (1.588 m)   Wt 180 lb 12.8 oz (82 kg)   BMI 32.54 kg/m      Assessment & Plan:  1. Weight loss counseling, encounter for - phentermine (ADIPEX-P) 37.5 MG tablet; Take 1 tablet (37.5 mg total) by  mouth daily before breakfast.  Dispense: 30 tablet; Refill: 2 - CMP14+EGFR  2. Class 1 obesity with body mass index (BMI) of 31.0 to 31.9 in adult, unspecified obesity type, unspecified whether serious comorbidity present - phentermine (ADIPEX-P) 37.5 MG tablet; Take 1 tablet (37.5 mg total) by mouth daily before breakfast.  Dispense: 30 tablet; Refill: 2 - CMP14+EGFR   Will change Contrave to Phentermine  Encourage exercise and diet Need to lose 5% of weight to continue medication Labs pending RTO in 3 months   [name], FNP

## 2017-04-29 ENCOUNTER — Ambulatory Visit (INDEPENDENT_AMBULATORY_CARE_PROVIDER_SITE_OTHER): Payer: BLUE CROSS/BLUE SHIELD | Admitting: *Deleted

## 2017-04-29 DIAGNOSIS — Z23 Encounter for immunization: Secondary | ICD-10-CM

## 2017-04-29 NOTE — Progress Notes (Signed)
Pt given Shingrix vaccine Tolerated well 

## 2017-05-23 ENCOUNTER — Other Ambulatory Visit: Payer: Self-pay | Admitting: Family Medicine

## 2017-07-17 ENCOUNTER — Other Ambulatory Visit: Payer: Self-pay | Admitting: Family Medicine

## 2017-07-18 ENCOUNTER — Other Ambulatory Visit: Payer: Self-pay | Admitting: Family

## 2017-07-18 DIAGNOSIS — Z6831 Body mass index (BMI) 31.0-31.9, adult: Secondary | ICD-10-CM

## 2017-07-18 DIAGNOSIS — Z713 Dietary counseling and surveillance: Secondary | ICD-10-CM

## 2017-07-18 DIAGNOSIS — E669 Obesity, unspecified: Secondary | ICD-10-CM

## 2017-07-31 ENCOUNTER — Encounter: Payer: Self-pay | Admitting: Family

## 2017-07-31 ENCOUNTER — Ambulatory Visit: Payer: BLUE CROSS/BLUE SHIELD | Admitting: Family

## 2017-07-31 VITALS — BP 127/80 | HR 102 | Temp 97.3°F | Ht 62.5 in | Wt 187.6 lb

## 2017-07-31 DIAGNOSIS — Z713 Dietary counseling and surveillance: Secondary | ICD-10-CM

## 2017-07-31 DIAGNOSIS — E669 Obesity, unspecified: Secondary | ICD-10-CM | POA: Diagnosis not present

## 2017-07-31 DIAGNOSIS — K219 Gastro-esophageal reflux disease without esophagitis: Secondary | ICD-10-CM

## 2017-07-31 DIAGNOSIS — L719 Rosacea, unspecified: Secondary | ICD-10-CM | POA: Diagnosis not present

## 2017-07-31 DIAGNOSIS — Z6833 Body mass index (BMI) 33.0-33.9, adult: Secondary | ICD-10-CM | POA: Diagnosis not present

## 2017-07-31 MED ORDER — METRONIDAZOLE 1 % EX GEL: Freq: Every day | CUTANEOUS | 0 refills | Status: DC

## 2017-07-31 MED ORDER — PHENTERMINE HCL 37.5 MG PO TABS: 37.5000 mg | ORAL_TABLET | Freq: Every day | ORAL | 2 refills | Status: DC

## 2017-07-31 MED ORDER — DEXLANSOPRAZOLE 30 MG PO CPDR: 30.0000 mg | DELAYED_RELEASE_CAPSULE | Freq: Every day | ORAL | 3 refills | Status: DC

## 2017-07-31 NOTE — Patient Instructions (Signed)
Rosacea Rosacea is a long-term (chronic) condition that affects the skin of the face, including the cheeks, nose, brow, and chin. This condition can also affect the eyes. Rosacea causes blood vessels near the surface of the skin to enlarge, which results in redness. What are the causes? The cause of this condition is not known. Certain triggers can make rosacea worse, including:  Hot baths.  Exercise.  Sunlight.  Very hot or cold temperatures.  Hot or spicy foods and drinks.  Drinking alcohol.  Stress.  Taking blood pressure medicine.  Long-term use of topical steroids on the face.  What increases the risk? This condition is more likely to develop in:  People who are older than 66 years of age.  Women.  People who have light-colored skin (light complexion).  People who have a family history of rosacea.  What are the signs or symptoms? Symptoms of this condition include:  Redness of the face.  Red bumps or pimples on the face.  A red, enlarged nose.  Blushing easily.  Red lines on the skin.  Irritated or burning feeling in the eyes.  Swollen eyelids.  How is this diagnosed? This condition is diagnosed with a medical history and physical exam. How is this treated? There is no cure for this condition, but treatment can help to control your symptoms. Your health care provider may recommend that you see a skin specialist (dermatologist). Treatment may include:  Antibiotic medicines that are applied to the skin or taken as a pill.  Laser treatment to improve the appearance of the skin.  Surgery. This is rare.  Your health care provider will also recommend the best way to take care of your skin. Even after your skin improves, you will likely need to continue treatment to prevent your rosacea from coming back. Follow these instructions at home: Skin Care Take care of your skin as told by your health care provider. You may be told to do these things:  Wash  your skin gently two or more times each day.  Use mild soap.  Use a sunscreen or sunblock with SPF 30 or greater.  Use gentle cosmetics that are meant for sensitive skin.  Shave with an electric shaver instead of a blade.  Lifestyle  Try to keep track of what foods trigger this condition. Avoid any triggers. These may include: ? Spicy foods. ? Seafood. ? Cheese. ? Hot liquids. ? Nuts. ? Chocolate. ? Iodized salt.  Do not drink alcohol.  Avoid extremely cold or hot temperatures.  Try to reduce your stress. If you need help, talk with your health care provider.  When you exercise, do these things to stay cool: ? Limit your sun exposure. ? Use a fan. ? Do shorter and more frequent intervals of exercise. General instructions  Keep all follow-up visits as told by your health care provider. This is important.  Take over-the-counter and prescription medicines only as told by your health care provider.  If your eyelids are affected, apply warm compresses to them. Do this as told by your health care provider.  If you were prescribed an antibiotic medicine, apply or take it as told by your health care provider. Do not stop using the antibiotic even if your condition improves. Contact a health care provider if:  Your symptoms get worse.  Your symptoms do not improve after two months of treatment.  You have new symptoms.  You have any changes in vision or you have problems with your eyes, such as   redness or itching.  You feel depressed.  You lose your appetite.  You have trouble concentrating. This information is not intended to replace advice given to you by your health care provider. Make sure you discuss any questions you have with your health care provider. Document Released: 08/08/2004 Document Revised: 12/07/2015 Document Reviewed: 09/07/2014 Elsevier Interactive Patient Education  2018 [employer] Inc[employer]  

## 2017-07-31 NOTE — Progress Notes (Signed)
Subjective:    Patient ID: SARISSA DERN, female    DOB: [birth year], 67 y.o.   MRN: [id number]  PT presents to the office today to recheck weight loss. Pt was started on Phentermine in 04/24/17. Pt states she feels like this was helping, but with Thanksgiving,  Christmas, and her birthday she "fell off the wagon". Pt states she is ready to restart her diet. Pt states she is not exercising regularly.  PT also complaining of redness and flushing of her face that she has had for years, but with the extremely cold weather feels like it is worse.  Gastroesophageal Reflux  She reports no belching or no heartburn. This is a chronic problem. The current episode started more than 1 year ago. The problem occurs occasionally. The problem has been resolved. The symptoms are aggravated by certain foods. Risk factors include obesity. She has tried a PPI for the symptoms.         Review of Systems  Gastrointestinal: Negative for heartburn.  All other systems reviewed and are negative.      Objective:   Physical Exam  Constitutional: She is oriented to person, place, and time. She appears well-developed and well-nourished. No distress.  HENT:  Head: Normocephalic.  Eyes: Pupils are equal, round, and reactive to light.  Neck: Normal range of motion. Neck supple. No thyromegaly present.  Cardiovascular: Normal rate, regular rhythm, normal heart sounds and intact distal pulses.  No murmur heard. Pulmonary/Chest: Effort normal and breath sounds normal. No respiratory distress. She has no wheezes.  Abdominal: Soft. Bowel sounds are normal. She exhibits no distension. There is no tenderness.  Musculoskeletal: Normal range of motion. She exhibits no edema or tenderness.  Neurological: She is alert and oriented to person, place, and time.  Skin: Skin is warm and dry.  Bilateral cheeks and nose erythemas  Psychiatric: She has a normal mood and affect. Her behavior is normal. Judgment and thought  content normal.  Vitals reviewed.     BP 127/80   Pulse (!) 102   Temp (!) 97.3 F (36.3 C) (Oral)   Ht 5' 2.5" (1.588 m)   Wt 187 lb 9.6 oz (85.1 kg)   BMI 33.77 kg/m      Assessment & Plan:  1. Class 1 obesity with body mass index (BMI) of 33.0 to 33.9 in adult, unspecified obesity type, unspecified whether serious comorbidity present   We will take a one month break from the phentermine, then resume Pt must lose 5% to continue medication Encourage healthy diet and exercise - CMP14+EGFR - phentermine (ADIPEX-P) 37.5 MG tablet; Take 1 tablet (37.5 mg total) by mouth daily before breakfast.  Dispense: 30 tablet; Refill: 2  2. Weight loss counseling, encounter for   We will take a one month break from the phentermine, then resume Pt must lose 5% to continue medication Encourage healthy diet and exercise - CMP14+EGFR - phentermine (ADIPEX-P) 37.5 MG tablet; Take 1 tablet (37.5 mg total) by mouth daily before breakfast.  Dispense: 30 tablet; Refill: 2  3. Gastroesophageal reflux disease, esophagitis presence not specified -Diet discussed- Avoid fried, spicy, citrus foods, caffeine and alcohol -Do not eat 2-3 hours before bedtime -Encouraged small frequent meals -Avoid NSAID's - CMP14+EGFR - Dexlansoprazole (DEXILANT) 30 MG capsule; Take 1 capsule (30 mg total) by mouth daily.  Dispense: 90 capsule; Refill: 3  4. Rosacea Avoid spicy foods, alcohol, stress, and hot or cold temperatures - CMP14+EGFR - metroNIDAZOLE (METROGEL) 1 % gel; Apply topically  daily.  Dispense: 45 g; Refill: 0   Continue all meds Labs pending Health Maintenance reviewed Diet and exercise encouraged RTO 4 months for weight loss recheck   [name], FNP

## 2017-08-01 LAB — CMP14+EGFR
ALT: 26 IU/L (ref 0–32)
AST: 22 IU/L (ref 0–40)
Albumin/Globulin Ratio: 1.7 (ref 1.2–2.2)
Albumin: 4.3 g/dL (ref 3.6–4.8)
Alkaline Phosphatase: 110 IU/L (ref 39–117)
BUN / CREAT RATIO: 23 (ref 12–28)
BUN: 14 mg/dL (ref 8–27)
Bilirubin Total: <0.2 mg/dL (ref 0.0–1.2)
CALCIUM: 9.6 mg/dL (ref 8.7–10.3)
CO2: 20 mmol/L (ref 20–29)
CREATININE: 0.62 mg/dL (ref 0.57–1.00)
Chloride: 104 mmol/L (ref 96–106)
GFR, EST AFRICAN AMERICAN: 109 mL/min/{1.73_m2} (ref >59)
GFR, EST NON AFRICAN AMERICAN: 95 mL/min/{1.73_m2} (ref >59)
GLOBULIN, TOTAL: 2.5 g/dL (ref 1.5–4.5)
Glucose: 117 mg/dL — ABNORMAL HIGH (ref 65–99)
Potassium: 4.8 mmol/L (ref 3.5–5.2)
SODIUM: 140 mmol/L (ref 134–144)
TOTAL PROTEIN: 6.8 g/dL (ref 6.0–8.5)

## 2017-08-28 ENCOUNTER — Other Ambulatory Visit: Payer: Self-pay | Admitting: Family

## 2017-08-28 DIAGNOSIS — L719 Rosacea, unspecified: Secondary | ICD-10-CM

## 2017-10-24 ENCOUNTER — Other Ambulatory Visit: Payer: Self-pay | Admitting: Family Medicine

## 2017-10-27 ENCOUNTER — Other Ambulatory Visit: Payer: Self-pay | Admitting: Family Medicine

## 2017-10-31 ENCOUNTER — Other Ambulatory Visit: Payer: Self-pay | Admitting: Family

## 2017-10-31 DIAGNOSIS — Z6833 Body mass index (BMI) 33.0-33.9, adult: Secondary | ICD-10-CM

## 2017-10-31 DIAGNOSIS — Z713 Dietary counseling and surveillance: Secondary | ICD-10-CM

## 2017-10-31 DIAGNOSIS — E669 Obesity, unspecified: Secondary | ICD-10-CM

## 2017-11-03 NOTE — Telephone Encounter (Signed)
Last seen 07/31/17  [facility]

## 2017-11-05 ENCOUNTER — Ambulatory Visit (INDEPENDENT_AMBULATORY_CARE_PROVIDER_SITE_OTHER): Payer: BLUE CROSS/BLUE SHIELD | Admitting: *Deleted

## 2017-11-05 DIAGNOSIS — Z23 Encounter for immunization: Secondary | ICD-10-CM

## 2017-11-05 NOTE — Progress Notes (Signed)
Shingrix 2nd vaccine given Pt tolerated well

## 2017-12-02 ENCOUNTER — Ambulatory Visit (INDEPENDENT_AMBULATORY_CARE_PROVIDER_SITE_OTHER): Payer: BLUE CROSS/BLUE SHIELD

## 2017-12-02 ENCOUNTER — Encounter: Payer: Self-pay | Admitting: Family

## 2017-12-02 ENCOUNTER — Ambulatory Visit: Payer: BLUE CROSS/BLUE SHIELD | Admitting: Family

## 2017-12-02 VITALS — BP 116/79 | HR 83 | Temp 97.0°F | Ht 62.5 in | Wt 188.4 lb

## 2017-12-02 DIAGNOSIS — Z713 Dietary counseling and surveillance: Secondary | ICD-10-CM

## 2017-12-02 DIAGNOSIS — Z0000 Encounter for general adult medical examination without abnormal findings: Secondary | ICD-10-CM

## 2017-12-02 DIAGNOSIS — E669 Obesity, unspecified: Secondary | ICD-10-CM | POA: Diagnosis not present

## 2017-12-02 DIAGNOSIS — Z23 Encounter for immunization: Secondary | ICD-10-CM | POA: Diagnosis not present

## 2017-12-02 DIAGNOSIS — Z6833 Body mass index (BMI) 33.0-33.9, adult: Secondary | ICD-10-CM

## 2017-12-02 DIAGNOSIS — K5909 Other constipation: Secondary | ICD-10-CM

## 2017-12-02 DIAGNOSIS — Z780 Asymptomatic menopausal state: Secondary | ICD-10-CM

## 2017-12-02 DIAGNOSIS — K219 Gastro-esophageal reflux disease without esophagitis: Secondary | ICD-10-CM

## 2017-12-02 DIAGNOSIS — R5383 Other fatigue: Secondary | ICD-10-CM

## 2017-12-02 DIAGNOSIS — M8589 Other specified disorders of bone density and structure, multiple sites: Secondary | ICD-10-CM | POA: Diagnosis not present

## 2017-12-02 MED ORDER — PHENTERMINE HCL 37.5 MG PO TABS: 37.5000 mg | ORAL_TABLET | Freq: Every day | ORAL | 2 refills | Status: DC

## 2017-12-02 NOTE — Progress Notes (Signed)
Subjective:    Patient ID: Angela Curry, female    DOB: 1951-04-18, 67 y.o.   MRN: [id number]  Chief Complaint  Patient presents with  . Medical Management of Chronic Issues    four month recheck   Pt presents to the office today for CPE without pap. She is complaining of fatigue over the last month that seems to becoming worse.   PT requesting to restart phentermine. States she has been off for a month and would like to restart for daughter's wedding in September.  Gastroesophageal Reflux  She complains of water brash. She reports no chest pain or no heartburn. This is a chronic problem. The problem occurs frequently. The problem has been waxing and waning. Risk factors include obesity. She has tried a PPI for the symptoms. The treatment provided moderate relief.  Constipation  This is a chronic problem. The current episode started more than 1 year ago. The problem has been resolved since onset. Her stool frequency is 4 to 5 times per week. She has tried diet changes and laxatives for the symptoms. The treatment provided moderate relief.  Hyperlipidemia  This is a chronic problem. The current episode started more than 1 year ago. The problem is uncontrolled. Recent lipid tests were reviewed and are high. Pertinent negatives include no chest pain. Current antihyperlipidemic treatment includes diet change. The current treatment provides mild improvement of lipids.      Review of Systems  Cardiovascular: Negative for chest pain.  Gastrointestinal: Positive for constipation. Negative for heartburn.  All other systems reviewed and are negative.      Objective:   Physical Exam  Constitutional: She is oriented to person, place, and time. She appears well-developed and well-nourished. No distress.  HENT:  Head: Normocephalic and atraumatic.  Right Ear: External ear normal.  Left Ear: External ear normal.  Mouth/Throat: Oropharynx is clear and moist.  Eyes: Pupils are equal,  round, and reactive to light.  Neck: Normal range of motion. Neck supple. No thyromegaly present.  Cardiovascular: Normal rate, regular rhythm, normal heart sounds and intact distal pulses.  No murmur heard. Pulmonary/Chest: Effort normal and breath sounds normal. No respiratory distress. She has no wheezes.  Abdominal: Soft. Bowel sounds are normal. She exhibits no distension. There is no tenderness.  Musculoskeletal: Normal range of motion. She exhibits no edema or tenderness.  Neurological: She is alert and oriented to person, place, and time. She has normal reflexes. No cranial nerve deficit.  Skin: Skin is warm and dry.  Psychiatric: She has a normal mood and affect. Her behavior is normal. Judgment and thought content normal.  Vitals reviewed.    BP 116/79   Pulse 83   Temp (!) 97 F (36.1 C) (Oral)   Ht 5' 2.5" (1.588 m)   Wt 188 lb 6.4 oz (85.5 kg)   BMI 33.91 kg/m      Assessment & Plan:  Angela Curry comes in today with chief complaint of Medical Management of Chronic Issues (four month recheck, medication refills)   Diagnosis and orders addressed:  1. Annual physical exam - Anemia Profile B - CMP14+EGFR - Lipid panel - TSH - VITAMIN D 25 Hydroxy (Vit-D Deficiency, Fractures)  2. Gastroesophageal reflux disease, esophagitis presence not specified  - CMP14+EGFR  3. Chronic constipation - CMP14+EGFR  4. Obesity (BMI 30-39.9) - CMP14+EGFR  5. Other fatigue  - Anemia Profile B - CMP14+EGFR - VITAMIN D 25 Hydroxy (Vit-D Deficiency, Fractures)  6. Post-menopause - CMP14+EGFR -  DG [facility] DEXA   7. Weight loss counseling, encounter for - phentermine (ADIPEX-P) 37.5 MG tablet; Take 1 tablet (37.5 mg total) by mouth daily before breakfast.  Dispense: 30 tablet; Refill: 2  8. Class 1 obesity with body mass index (BMI) of 33.0 to 33.9 in adult, unspecified obesity type, unspecified whether serious comorbidity present - phentermine (ADIPEX-P) 37.5 MG  tablet; Take 1 tablet (37.5 mg total) by mouth daily before breakfast.  Dispense: 30 tablet; Refill: 2  Labs pending Health Maintenance reviewed Diet and exercise encouraged  Follow up plan 6 months    [name], FNP

## 2017-12-02 NOTE — Patient Instructions (Signed)

## 2017-12-03 LAB — ANEMIA PROFILE B
BASOS ABS: 0 10*3/uL (ref 0.0–0.2)
BASOS: 0 %
EOS (ABSOLUTE): 0.1 10*3/uL (ref 0.0–0.4)
Eos: 2 %
FERRITIN: 82 ng/mL (ref 15–150)
FOLATE: 2.8 ng/mL — AB (ref >3.0)
HEMATOCRIT: 39.4 % (ref 34.0–46.6)
Hemoglobin: 12.9 g/dL (ref 11.1–15.9)
Immature Grans (Abs): 0 10*3/uL (ref 0.0–0.1)
Immature Granulocytes: 0 %
Iron Saturation: 21 % (ref 15–55)
Iron: 61 ug/dL (ref 27–139)
LYMPHS ABS: 1.3 10*3/uL (ref 0.7–3.1)
Lymphs: 27 %
MCH: 27.3 pg (ref 26.6–33.0)
MCHC: 32.7 g/dL (ref 31.5–35.7)
MCV: 84 fL (ref 79–97)
MONOCYTES: 10 %
MONOS ABS: 0.5 10*3/uL (ref 0.1–0.9)
NEUTROS ABS: 2.8 10*3/uL (ref 1.4–7.0)
Neutrophils: 61 %
PLATELETS: 323 10*3/uL (ref 150–450)
RBC: 4.72 x10E6/uL (ref 3.77–5.28)
RDW: 14.9 % (ref 12.3–15.4)
Retic Ct Pct: 1.2 % (ref 0.6–2.6)
Total Iron Binding Capacity: 289 ug/dL (ref 250–450)
UIBC: 228 ug/dL (ref 118–369)
Vitamin B-12: 364 pg/mL (ref 232–1245)
WBC: 4.7 10*3/uL (ref 3.4–10.8)

## 2017-12-03 LAB — CMP14+EGFR
A/G RATIO: 2.1 (ref 1.2–2.2)
ALK PHOS: 111 IU/L (ref 39–117)
ALT: 24 IU/L (ref 0–32)
AST: 18 IU/L (ref 0–40)
Albumin: 4.4 g/dL (ref 3.6–4.8)
BILIRUBIN TOTAL: 0.3 mg/dL (ref 0.0–1.2)
BUN/Creatinine Ratio: 17 (ref 12–28)
BUN: 12 mg/dL (ref 8–27)
CHLORIDE: 103 mmol/L (ref 96–106)
CO2: 23 mmol/L (ref 20–29)
Calcium: 9.3 mg/dL (ref 8.7–10.3)
Creatinine, Ser: 0.69 mg/dL (ref 0.57–1.00)
GFR calc non Af Amer: 92 mL/min/{1.73_m2} (ref >59)
GFR, EST AFRICAN AMERICAN: 106 mL/min/{1.73_m2} (ref >59)
GLUCOSE: 108 mg/dL — AB (ref 65–99)
Globulin, Total: 2.1 g/dL (ref 1.5–4.5)
POTASSIUM: 4.6 mmol/L (ref 3.5–5.2)
Sodium: 140 mmol/L (ref 134–144)
TOTAL PROTEIN: 6.5 g/dL (ref 6.0–8.5)

## 2017-12-03 LAB — VITAMIN D 25 HYDROXY (VIT D DEFICIENCY, FRACTURES): VIT D 25 HYDROXY: 11.6 ng/mL — AB (ref 30.0–100.0)

## 2017-12-03 LAB — TSH: TSH: 2.82 u[IU]/mL (ref 0.450–4.500)

## 2017-12-03 LAB — LIPID PANEL
CHOLESTEROL TOTAL: 245 mg/dL — AB (ref 100–199)
Chol/HDL Ratio: 4 ratio (ref 0.0–4.4)
HDL: 61 mg/dL (ref >39)
LDL Calculated: 161 mg/dL — ABNORMAL HIGH (ref 0–99)
Triglycerides: 114 mg/dL (ref 0–149)
VLDL Cholesterol Cal: 23 mg/dL (ref 5–40)

## 2017-12-04 ENCOUNTER — Other Ambulatory Visit: Payer: Self-pay | Admitting: Family

## 2017-12-04 DIAGNOSIS — E538 Deficiency of other specified B group vitamins: Secondary | ICD-10-CM | POA: Insufficient documentation

## 2017-12-04 MED ORDER — VITAMIN D (ERGOCALCIFEROL) 1.25 MG (50000 UNIT) PO CAPS: 50000.0000 [IU] | ORAL_CAPSULE | ORAL | 3 refills | Status: DC

## 2017-12-04 MED ORDER — FOLIC ACID 1 MG PO TABS: 1.0000 mg | ORAL_TABLET | Freq: Every day | ORAL | 1 refills | Status: DC

## 2018-01-08 ENCOUNTER — Encounter: Payer: Self-pay | Admitting: Family

## 2018-01-08 ENCOUNTER — Other Ambulatory Visit: Payer: Self-pay | Admitting: Family

## 2018-01-08 MED ORDER — MELOXICAM 15 MG PO TABS: 15.0000 mg | ORAL_TABLET | Freq: Every day | ORAL | 2 refills | Status: DC

## 2018-03-03 ENCOUNTER — Other Ambulatory Visit: Payer: Self-pay | Admitting: Family

## 2018-03-03 DIAGNOSIS — E669 Obesity, unspecified: Secondary | ICD-10-CM

## 2018-03-03 DIAGNOSIS — Z6833 Body mass index (BMI) 33.0-33.9, adult: Secondary | ICD-10-CM

## 2018-03-03 DIAGNOSIS — Z713 Dietary counseling and surveillance: Secondary | ICD-10-CM

## 2018-03-05 ENCOUNTER — Ambulatory Visit: Payer: BLUE CROSS/BLUE SHIELD | Admitting: Family

## 2018-03-24 ENCOUNTER — Ambulatory Visit: Payer: BLUE CROSS/BLUE SHIELD | Admitting: Family

## 2018-03-24 ENCOUNTER — Encounter: Payer: Self-pay | Admitting: Family

## 2018-03-24 ENCOUNTER — Other Ambulatory Visit: Payer: Self-pay | Admitting: Family

## 2018-03-24 VITALS — BP 126/87 | HR 85 | Temp 97.8°F | Ht 62.5 in | Wt 189.2 lb

## 2018-03-24 DIAGNOSIS — Z713 Dietary counseling and surveillance: Secondary | ICD-10-CM | POA: Diagnosis not present

## 2018-03-24 DIAGNOSIS — K5909 Other constipation: Secondary | ICD-10-CM | POA: Diagnosis not present

## 2018-03-24 DIAGNOSIS — E669 Obesity, unspecified: Secondary | ICD-10-CM

## 2018-03-24 LAB — CMP14+EGFR
A/G RATIO: 2 (ref 1.2–2.2)
ALT: 23 IU/L (ref 0–32)
AST: 18 IU/L (ref 0–40)
Albumin: 4.2 g/dL (ref 3.6–4.8)
Alkaline Phosphatase: 106 IU/L (ref 39–117)
BUN/Creatinine Ratio: 16 (ref 12–28)
BUN: 11 mg/dL (ref 8–27)
Bilirubin Total: <0.2 mg/dL (ref 0.0–1.2)
CALCIUM: 9.4 mg/dL (ref 8.7–10.3)
CO2: 21 mmol/L (ref 20–29)
Chloride: 103 mmol/L (ref 96–106)
Creatinine, Ser: 0.68 mg/dL (ref 0.57–1.00)
GFR calc non Af Amer: 92 mL/min/{1.73_m2} (ref >59)
GFR, EST AFRICAN AMERICAN: 106 mL/min/{1.73_m2} (ref >59)
Globulin, Total: 2.1 g/dL (ref 1.5–4.5)
Glucose: 108 mg/dL — ABNORMAL HIGH (ref 65–99)
Potassium: 4.5 mmol/L (ref 3.5–5.2)
Sodium: 141 mmol/L (ref 134–144)
TOTAL PROTEIN: 6.3 g/dL (ref 6.0–8.5)

## 2018-03-24 MED ORDER — LINACLOTIDE 145 MCG PO CAPS
145.0000 ug | ORAL_CAPSULE | Freq: Every day | ORAL | 1 refills | Status: DC
Start: 2018-03-24 — End: 2018-05-27

## 2018-03-24 NOTE — Patient Instructions (Signed)

## 2018-03-24 NOTE — Progress Notes (Signed)
   Subjective:    Patient ID: Angela Curry, female    DOB: [birth year], 67 y.o.   MRN: [id number]  Chief Complaint  Patient presents with  . Medical Management of Chronic Issues    three month recheck   PT presents to the office today to recheck weight loss. She states her daughter is getting married this Saturday and has been very stressed and has not watched her diet like she should.   She is also complaining the Amitiza is not working like it use to.  Constipation  This is a chronic problem. The current episode started more than 1 year ago. The problem has been waxing and waning since onset. Her stool frequency is 2 to 3 times per week. She has tried laxatives for the symptoms. The treatment provided mild relief.      Review of Systems  Gastrointestinal: Positive for constipation.  All other systems reviewed and are negative.      Objective:   Physical Exam  Constitutional: She is oriented to person, place, and time. She appears well-developed and well-nourished. No distress.  HENT:  Head: Normocephalic.  Eyes: Pupils are equal, round, and reactive to light.  Neck: Normal range of motion. Neck supple. No thyromegaly present.  Cardiovascular: Normal rate, regular rhythm, normal heart sounds and intact distal pulses.  No murmur heard. Pulmonary/Chest: Effort normal and breath sounds normal. No respiratory distress. She has no wheezes.  Abdominal: Soft. Bowel sounds are normal. She exhibits no distension. There is no tenderness.  Musculoskeletal: Normal range of motion. She exhibits no edema or tenderness.  Neurological: She is alert and oriented to person, place, and time. She has normal reflexes. No cranial nerve deficit.  Skin: Skin is warm and dry.  Psychiatric: She has a normal mood and affect. Her behavior is normal. Judgment and thought content normal.  Vitals reviewed.     BP 126/87   Pulse 85   Temp 97.8 F (36.6 C) (Oral)   Ht 5' 2.5" (1.588 m)   Wt  189 lb 3.2 oz (85.8 kg)   BMI 34.05 kg/m      Assessment & Plan:  Angela Curry comes in today with chief complaint of Medical Management of Chronic Issues (three month recheck)   Diagnosis and orders addressed:  1. Chronic constipation Will stop Amitiza and try Linzess Force fluids Balance diet - linaclotide (LINZESS) 145 MCG CAPS capsule; Take 1 capsule (145 mcg total) by mouth daily before breakfast.  Dispense: 90 capsule; Refill: 1 - CMP14+EGFR  2. Obesity (BMI 30-39.9) - CMP14+EGFR  3. Weight loss counseling, encounter for Will stop phentermine today since she has not lost any weight over the last 3 months Encourage healthy diet and exercise  - CMP14+EGFR   Labs pending Health Maintenance reviewed Diet and exercise encouraged  Follow up plan: 6 months    Angela Dun, FNP

## 2018-04-23 ENCOUNTER — Other Ambulatory Visit: Payer: Self-pay | Admitting: Family

## 2018-05-01 ENCOUNTER — Ambulatory Visit (INDEPENDENT_AMBULATORY_CARE_PROVIDER_SITE_OTHER): Payer: BLUE CROSS/BLUE SHIELD

## 2018-05-01 DIAGNOSIS — Z23 Encounter for immunization: Secondary | ICD-10-CM

## 2018-05-27 ENCOUNTER — Ambulatory Visit: Payer: BLUE CROSS/BLUE SHIELD | Admitting: Physician Assistant

## 2018-05-27 ENCOUNTER — Encounter: Payer: Self-pay | Admitting: Physician Assistant

## 2018-05-27 VITALS — BP 154/84 | HR 75 | Temp 96.6°F | Ht 62.5 in | Wt 200.0 lb

## 2018-05-27 DIAGNOSIS — K047 Periapical abscess without sinus: Secondary | ICD-10-CM | POA: Diagnosis not present

## 2018-05-27 DIAGNOSIS — K5909 Other constipation: Secondary | ICD-10-CM | POA: Diagnosis not present

## 2018-05-27 MED ORDER — PENICILLIN V POTASSIUM 500 MG PO TABS: 500.0000 mg | ORAL_TABLET | Freq: Four times a day (QID) | ORAL | 2 refills | Status: DC

## 2018-05-27 MED ORDER — LINACLOTIDE 290 MCG PO CAPS: 290.0000 ug | ORAL_CAPSULE | Freq: Every day | ORAL | 11 refills | Status: DC

## 2018-05-29 DIAGNOSIS — K047 Periapical abscess without sinus: Secondary | ICD-10-CM | POA: Insufficient documentation

## 2018-05-29 NOTE — Progress Notes (Signed)
BP (!) 154/84   Pulse 75   Temp (!) 96.6 F (35.9 C) (Oral)   Ht 5' 2.5" (1.588 m)   Wt 200 lb (90.7 kg)   BMI 36.00 kg/m    Subjective:    Patient ID: Angela Curry, female    DOB: Jul 02, 1951, 67 y.o.   MRN: [id number]  HPI: Angela Curry is a 67 y.o. female presenting on 05/27/2018 for Sinusitis  Patient comes in with left cheek pain that she thought was possibly a sinus infection.  She has had problems with for over she is noticed that she has swelling in the posterior area of her upper gum behind the tooth that has a cap on it.  She does have a dental appointment in a couple of weeks.  But she is concerned that there is infection around the tooth.  She denies any fever or chills.  No nausea vomiting or diarrhea.  She also has chronic constipation and was started on Linzess 145.  She states that if not been completely successful yet.  It would like to be increased to the 290 mg.  We will do that and have her follow-up with her PCP.  Past Medical History:  Diagnosis Date  . Asthma   . Dysphagia   . Fibrocystic breast disease   . GERD (gastroesophageal reflux disease)   . H/O cold sores   . Hiatal hernia   . Osteopenia   . Psoriasis   . Vitamin D deficiency    Relevant past medical, surgical, family and social history reviewed and updated as indicated. Interim medical history since our last visit reviewed. Allergies and medications reviewed and updated. DATA REVIEWED: CHART IN EPIC  Family History reviewed for pertinent findings.  Review of Systems  Constitutional: Positive for fatigue. Negative for activity change, appetite change and chills.  HENT: Positive for congestion and mouth sores. Negative for postnasal drip and sore throat.   Eyes: Negative.   Respiratory: Negative for cough and wheezing.   Cardiovascular: Negative.  Negative for chest pain, palpitations and leg swelling.  Gastrointestinal: Negative.   Genitourinary: Negative.     Musculoskeletal: Negative.   Skin: Negative.   Neurological: Positive for headaches.    Allergies as of 05/27/2018   No Known Allergies     Medication List        Accurate as of 05/27/18 11:59 PM. Always use your most recent med list.          calcipotriene 0.005 % cream Commonly known as:  DOVONOX Apply topically 2 (two) times daily.   clobetasol ointment 0.05 % Commonly known as:  TEMOVATE Apply once daily.   Dexlansoprazole 30 MG capsule Take 1 capsule (30 mg total) by mouth daily.   fluticasone 50 MCG/ACT nasal spray Commonly known as:  FLONASE Place 2 sprays into both nostrils daily.   folic acid 1 MG tablet Commonly known as:  FOLVITE Take 1 tablet (1 mg total) by mouth daily.   linaclotide 290 MCG Caps capsule Commonly known as:  LINZESS Take 1 capsule (290 mcg total) by mouth daily before breakfast.   meloxicam 15 MG tablet Commonly known as:  MOBIC Take 1 tablet (15 mg total) by mouth daily.   metroNIDAZOLE 1 % gel Commonly known as:  METROGEL Apply topically daily.   penicillin v potassium 500 MG tablet Commonly known as:  VEETID Take 1 tablet (500 mg total) by mouth 4 (four) times daily.   phentermine 37.5 MG tablet  Commonly known as:  ADIPEX-P Take 1 tablet (37.5 mg total) by mouth daily before breakfast.   PREMPRO 0.3-1.5 MG tablet Generic drug:  estrogen (conjugated)-medroxyprogesterone TAKE 1 TABLET DAILY   valACYclovir 1000 MG tablet Commonly known as:  VALTREX Take 1 tablet (1,000 mg total) by mouth 2 (two) times daily.   Vitamin D (Ergocalciferol) 1.25 MG (50000 UT) Caps capsule Commonly known as:  DRISDOL Take 1 capsule (50,000 Units total) by mouth every 7 (seven) Curry.          Objective:    BP (!) 154/84   Pulse 75   Temp (!) 96.6 F (35.9 C) (Oral)   Ht 5' 2.5" (1.588 m)   Wt 200 lb (90.7 kg)   BMI 36.00 kg/m   No Known Allergies  Wt Readings from Last 3 Encounters:  05/27/18 200 lb (90.7 kg)  03/24/18 189  lb 3.2 oz (85.8 kg)  12/02/17 188 lb 6.4 oz (85.5 kg)    Physical Exam  Constitutional: She is oriented to person, place, and time. She appears well-developed and well-nourished.  HENT:  Head: Normocephalic and atraumatic.  Mouth/Throat: Oral lesions present. Dental abscesses and dental caries present.    Eyes: Pupils are equal, round, and reactive to light. Conjunctivae and EOM are normal.  Cardiovascular: Normal rate, regular rhythm, normal heart sounds and intact distal pulses.  Pulmonary/Chest: Effort normal and breath sounds normal.  Abdominal: Soft. Bowel sounds are normal.  Neurological: She is alert and oriented to person, place, and time. She has normal reflexes.  Skin: Skin is warm and dry. No rash noted.  Psychiatric: She has a normal mood and affect. Her behavior is normal. Judgment and thought content normal.    Results for orders placed or performed in visit on 03/24/18  CMP14+EGFR  Result Value Ref Range   Glucose 108 (H) 65 - 99 mg/dL   BUN 11 8 - 27 mg/dL   Creatinine, Ser 0.68 0.57 - 1.00 mg/dL   GFR calc non Af Amer 92 >59 mL/min/1.73   GFR calc Af Amer 106 >59 mL/min/1.73   BUN/Creatinine Ratio 16 12 - 28   Sodium 141 134 - 144 mmol/L   Potassium 4.5 3.5 - 5.2 mmol/L   Chloride 103 96 - 106 mmol/L   CO2 21 20 - 29 mmol/L   Calcium 9.4 8.7 - 10.3 mg/dL   Total Protein 6.3 6.0 - 8.5 g/dL   Albumin 4.2 3.6 - 4.8 g/dL   Globulin, Total 2.1 1.5 - 4.5 g/dL   Albumin/Globulin Ratio 2.0 1.2 - 2.2   Bilirubin Total <0.2 0.0 - 1.2 mg/dL   Alkaline Phosphatase 106 39 - 117 IU/L   AST 18 0 - 40 IU/L   ALT 23 0 - 32 IU/L      Assessment & Plan:   1. Chronic constipation - linaclotide (LINZESS) 290 MCG CAPS capsule; Take 1 capsule (290 mcg total) by mouth daily before breakfast.  Dispense: 30 capsule; Refill: 11  2. Dental abscess - penicillin v potassium (VEETID) 500 MG tablet; Take 1 tablet (500 mg total) by mouth 4 (four) times daily.  Dispense: 40 tablet;  Refill: 2   Continue all other maintenance medications as listed above.  Follow up plan: No follow-ups on file.  Educational handout given for [place] PA-C [facility] [address]  [place], [place] [address] [phone]   05/29/2018, 11:57 AM]

## 2018-06-01 ENCOUNTER — Other Ambulatory Visit: Payer: Self-pay | Admitting: Family

## 2018-06-08 IMAGING — DX DG ANKLE COMPLETE 3+V*R*
3 series · 3 of 3 positions shown · non-contrast
Comparison: None.

CLINICAL DATA: Right ankle pain, no known injury

EXAM:
RIGHT ANKLE - COMPLETE 3+ VIEW

[ankle ap]
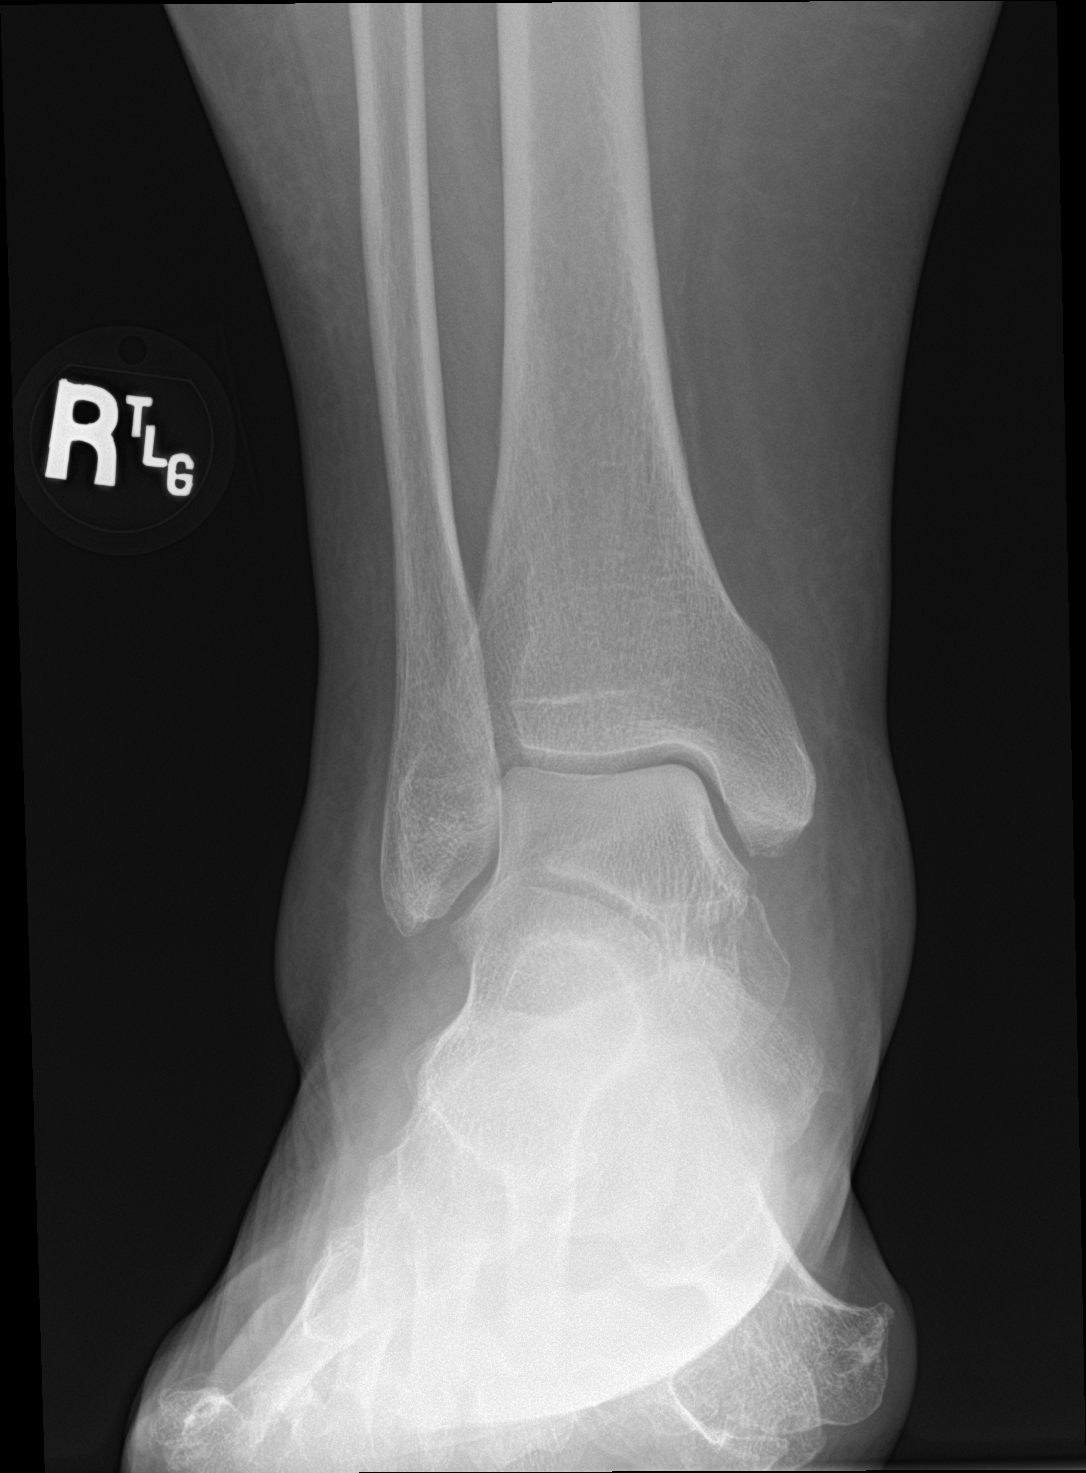

[ankle obl]
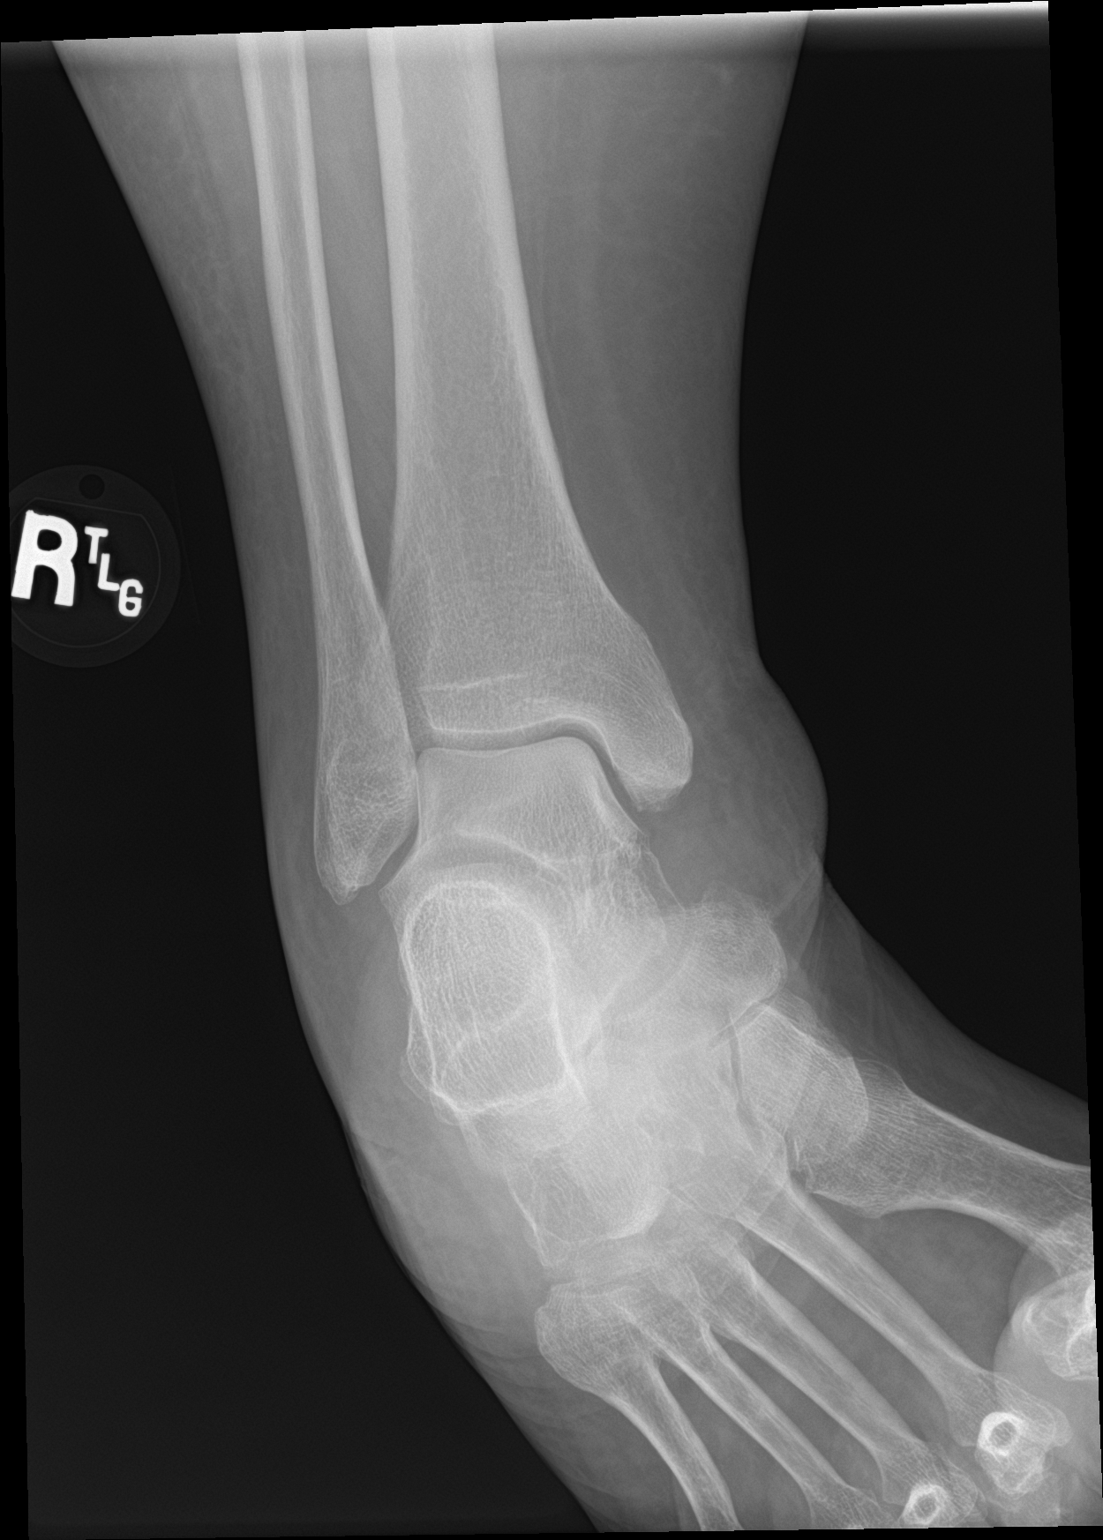

[ankle lat]
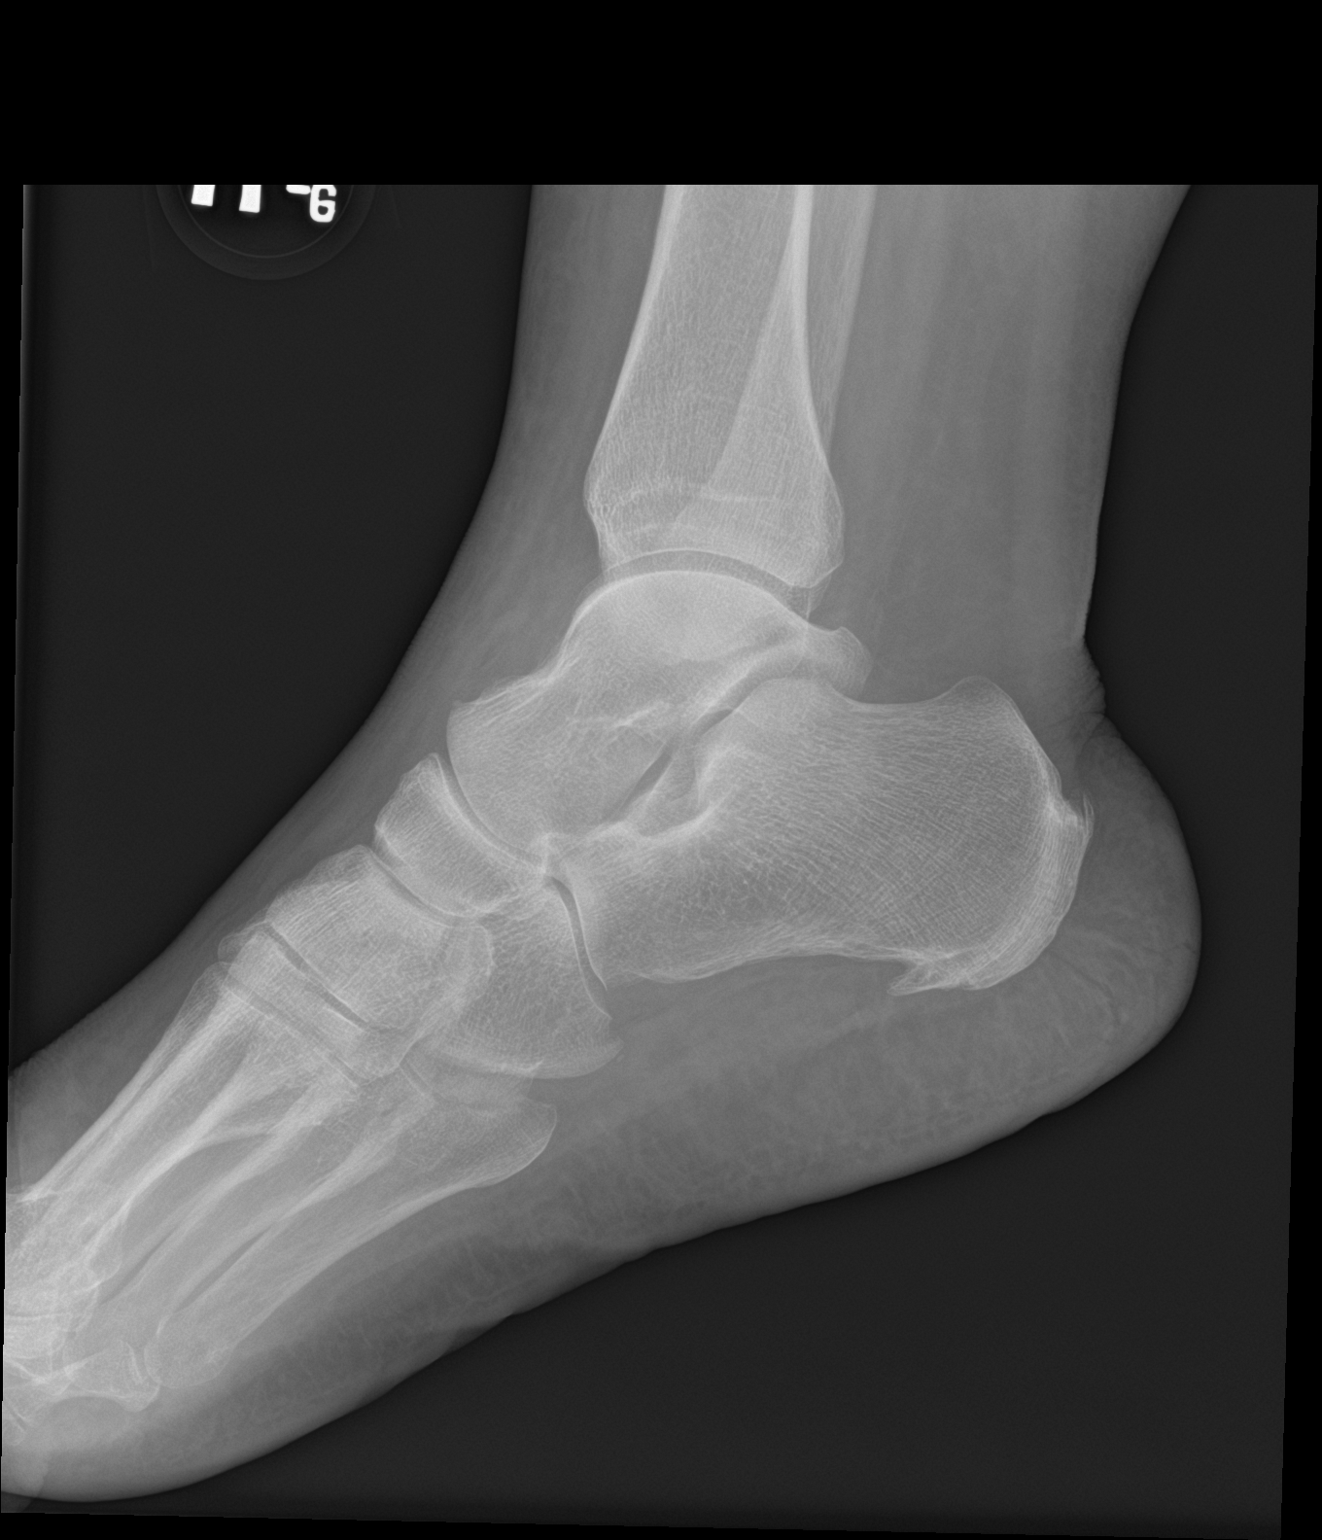

[3 of 3 positions shown; findings below may reference images not displayed]

FINDINGS: Three views of the right ankle submitted. No acute fracture or
subluxation. There is mild soft tissue swelling medial and
laterally. Small plantar spur of calcaneus. Tiny posterior spur of
calcaneus at Achilles tendon insertion.
IMPRESSION: 1. No acute fracture or subluxation. Mild soft tissue swelling.
Plantar and posterior spurring of calcaneus.

## 2018-06-09 DIAGNOSIS — Z1231 Encounter for screening mammogram for malignant neoplasm of breast: Secondary | ICD-10-CM | POA: Diagnosis not present

## 2018-06-09 LAB — HM MAMMOGRAPHY

## 2018-06-10 MED ORDER — DICLOFENAC SODIUM 1 % TD GEL: 2.0000 g | Freq: Four times a day (QID) | TRANSDERMAL | 1 refills | Status: DC

## 2018-06-16 ENCOUNTER — Encounter: Payer: Self-pay | Admitting: Physician Assistant

## 2018-06-16 ENCOUNTER — Other Ambulatory Visit: Payer: Self-pay | Admitting: Physician Assistant

## 2018-06-16 MED ORDER — FLUCONAZOLE 150 MG PO TABS: 150.0000 mg | ORAL_TABLET | Freq: Once | ORAL | 0 refills | Status: AC

## 2018-06-16 NOTE — Progress Notes (Signed)
flucon

## 2018-07-13 ENCOUNTER — Other Ambulatory Visit: Payer: Self-pay | Admitting: Family

## 2018-07-13 ENCOUNTER — Other Ambulatory Visit: Payer: Self-pay | Admitting: Sports Medicine

## 2018-07-13 DIAGNOSIS — L409 Psoriasis, unspecified: Secondary | ICD-10-CM

## 2018-07-16 ENCOUNTER — Ambulatory Visit: Payer: BLUE CROSS/BLUE SHIELD | Admitting: Family Medicine

## 2018-07-16 ENCOUNTER — Encounter: Payer: Self-pay | Admitting: Family Medicine

## 2018-07-16 VITALS — BP 151/88 | HR 76 | Temp 97.5°F | Ht 62.6 in | Wt 206.0 lb

## 2018-07-16 DIAGNOSIS — Z79890 Hormone replacement therapy (postmenopausal): Secondary | ICD-10-CM

## 2018-07-16 DIAGNOSIS — K219 Gastro-esophageal reflux disease without esophagitis: Secondary | ICD-10-CM | POA: Diagnosis not present

## 2018-07-16 DIAGNOSIS — M25562 Pain in left knee: Secondary | ICD-10-CM

## 2018-07-16 DIAGNOSIS — G8929 Other chronic pain: Secondary | ICD-10-CM | POA: Insufficient documentation

## 2018-07-16 DIAGNOSIS — K5909 Other constipation: Secondary | ICD-10-CM | POA: Diagnosis not present

## 2018-07-16 MED ORDER — CONJ ESTROG-MEDROXYPROGEST ACE 0.3-1.5 MG PO TABS: 1.0000 | ORAL_TABLET | Freq: Every day | ORAL | 3 refills | Status: DC

## 2018-07-16 MED ORDER — LINACLOTIDE 290 MCG PO CAPS: 290.0000 ug | ORAL_CAPSULE | Freq: Every day | ORAL | 3 refills | Status: DC

## 2018-07-16 MED ORDER — VALACYCLOVIR HCL 1 G PO TABS: 1000.0000 mg | ORAL_TABLET | Freq: Two times a day (BID) | ORAL | 3 refills | Status: DC

## 2018-07-16 MED ORDER — DEXLANSOPRAZOLE 30 MG PO CPDR: 30.0000 mg | DELAYED_RELEASE_CAPSULE | Freq: Every day | ORAL | 3 refills | Status: DC

## 2018-07-16 MED ORDER — MELOXICAM 15 MG PO TABS: 15.0000 mg | ORAL_TABLET | Freq: Every day | ORAL | 3 refills | Status: DC

## 2018-07-16 NOTE — Progress Notes (Signed)
Subjective:    Patient ID: Angela Curry, female    DOB: Aug 04, 1950, 67 y.o.   MRN: [id number]  Chief Complaint:  Left knee pain (x 1 month, started out being sensitive to touch but now hurts) and Medication Refill (Phenterimine)   HPI: Angela Curry is a 68 y.o. female presenting on 07/16/2018 for Left knee pain (x 1 month, started out being sensitive to touch but now hurts) and Medication Refill (Phenterimine)  Pt presents today for left knee pain and a refill on medications. Pt states she has had left knee pain for 6+ weeks. States she has not injured her knee, states the pain has just gradually became worse. She states the pain is worse with activity, climbing stairs, and certain movements. Pt states the pain is relieved by rest, support, ice, and Voltaren gel. States she has only used the Voltaren gel a few times and it was beneficial. Pt denies numbness, tingling, swelling, erythema, or loss of function.  Pt states she has been compliant with her medications. Denies adverse side effects. States the Dexlansprazole controls her GERD. She denies sore throat, reflux, cough, or water brash.  She reports the Linzess helps with her chronic constipation, denies adverse side effects.  She is on HRT after menopause. States she tolerates her Prempro well. Denies chest pain, shortness of breath, palpitations, headaches, or leg swelling.   Relevant past medical, surgical, family, and social history reviewed and updated as indicated.  Allergies and medications reviewed and updated.   Past Medical History:  Diagnosis Date  . Asthma   . Dysphagia   . Fibrocystic breast disease   . GERD (gastroesophageal reflux disease)   . H/O cold sores   . Hiatal hernia   . Osteopenia   . Psoriasis   . Vitamin D deficiency     Past Surgical History:  Procedure Laterality Date  . TONSILLECTOMY    . TUBAL LIGATION      Social History   Socioeconomic History  . Marital status: Divorced     Spouse name: Not on file  . Number of children: Not on file  . Years of education: Not on file  . Highest education level: Not on file  Occupational History  . Not on file  Social Needs  . Financial resource strain: Not on file  . Food insecurity:    Worry: Not on file    Inability: Not on file  . Transportation needs:    Medical: Not on file    Non-medical: Not on file  Tobacco Use  . Smoking status: Never Smoker  . Smokeless tobacco: Never Used  Substance and Sexual Activity  . Alcohol use: Yes  . Drug use: No  . Sexual activity: Not on file  Lifestyle  . Physical activity:    Days per week: Not on file    Minutes per session: Not on file  . Stress: Not on file  Relationships  . Social connections:    Talks on phone: Not on file    Gets together: Not on file    Attends religious service: Not on file    Active member of club or organization: Not on file    Attends meetings of clubs or organizations: Not on file    Relationship status: Not on file  . Intimate partner violence:    Fear of current or ex partner: Not on file    Emotionally abused: Not on file    Physically abused: Not on  file    Forced sexual activity: Not on file  Other Topics Concern  . Not on file  Social History Narrative  . Not on file    Outpatient Encounter Medications as of 07/16/2018  Medication Sig  . calcipotriene (DOVONOX) 0.005 % cream Apply topically 2 (two) times daily.  . clobetasol ointment (TEMOVATE) 0.05 % Apply topically daily.  Marland Kitchen Dexlansoprazole (DEXILANT) 30 MG capsule Take 1 capsule (30 mg total) by mouth daily.  . diclofenac sodium (VOLTAREN) 1 % GEL Apply 2 g topically 4 (four) times daily.  Marland Kitchen estrogen, conjugated,-medroxyprogesterone (PREMPRO) 0.3-1.5 MG tablet Take 1 tablet by mouth daily.  . fluticasone (FLONASE) 50 MCG/ACT nasal spray Place 2 sprays into both nostrils daily.  . folic acid (FOLVITE) 1 MG tablet Take 1 tablet (1 mg total) by mouth daily. (Needs to be  seen)  . linaclotide (LINZESS) 290 MCG CAPS capsule Take 1 capsule (290 mcg total) by mouth daily before breakfast.  . meloxicam (MOBIC) 15 MG tablet Take 1 tablet (15 mg total) by mouth daily.  . metroNIDAZOLE (METROGEL) 1 % gel Apply topically daily.  . penicillin v potassium (VEETID) 500 MG tablet Take 1 tablet (500 mg total) by mouth 4 (four) times daily.  . phentermine (ADIPEX-P) 37.5 MG tablet Take 1 tablet (37.5 mg total) by mouth daily before breakfast.  . valACYclovir (VALTREX) 1000 MG tablet Take 1 tablet (1,000 mg total) by mouth 2 (two) times daily.  . Vitamin D, Ergocalciferol, (DRISDOL) 50000 units CAPS capsule Take 1 capsule (50,000 Units total) by mouth every 7 (seven) days.  . [DISCONTINUED] Dexlansoprazole (DEXILANT) 30 MG capsule Take 1 capsule (30 mg total) by mouth daily.  . [DISCONTINUED] linaclotide (LINZESS) 290 MCG CAPS capsule Take 1 capsule (290 mcg total) by mouth daily before breakfast.  . [DISCONTINUED] meloxicam (MOBIC) 15 MG tablet Take 1 tablet (15 mg total) by mouth daily.  . [DISCONTINUED] PREMPRO 0.3-1.5 MG tablet TAKE 1 TABLET DAILY  . [DISCONTINUED] valACYclovir (VALTREX) 1000 MG tablet Take 1 tablet (1,000 mg total) by mouth 2 (two) times daily.   No facility-administered encounter medications on file as of 07/16/2018.     No Known Allergies  Review of Systems  Constitutional: Positive for activity change. Negative for chills, fatigue and fever.  Respiratory: Negative for cough, chest tightness and shortness of breath.   Cardiovascular: Negative for chest pain, palpitations and leg swelling.  Gastrointestinal: Negative for abdominal distention, abdominal pain, anal bleeding, blood in stool, constipation, diarrhea, nausea, rectal pain and vomiting.  Genitourinary: Negative for genital sores, pelvic pain, vaginal bleeding, vaginal discharge and vaginal pain.  Musculoskeletal: Positive for arthralgias (left knee) and gait problem. Negative for back pain,  joint swelling and myalgias.  Neurological: Negative for dizziness, tremors, seizures, syncope, facial asymmetry, speech difficulty, weakness, light-headedness, numbness and headaches.  Psychiatric/Behavioral: Negative for confusion.  All other systems reviewed and are negative.       Objective:    BP (!) 151/88   Pulse 76   Temp (!) 97.5 F (36.4 C) (Oral)   Ht 5' 2.6" (1.59 m)   Wt 206 lb (93.4 kg)   BMI 36.96 kg/m    Wt Readings from Last 3 Encounters:  07/16/18 206 lb (93.4 kg)  05/27/18 200 lb (90.7 kg)  03/24/18 189 lb 3.2 oz (85.8 kg)    Physical Exam Vitals signs and nursing note reviewed.  Constitutional:      General: She is not in acute distress.    Appearance: She  is well-developed and well-groomed. She is obese.  HENT:     Head: Normocephalic and atraumatic.     Mouth/Throat:     Mouth: Mucous membranes are moist.     Pharynx: Oropharynx is clear.  Eyes:     Conjunctiva/sclera: Conjunctivae normal.     Pupils: Pupils are equal, round, and reactive to light.  Cardiovascular:     Rate and Rhythm: Normal rate and regular rhythm.     Pulses: Normal pulses.     Heart sounds: No murmur. No friction rub. No gallop.   Pulmonary:     Effort: Pulmonary effort is normal.     Breath sounds: Normal breath sounds.  Musculoskeletal:     Left hip: Normal.     Left knee: She exhibits no swelling, no effusion, no ecchymosis, no deformity, no laceration, no erythema, normal alignment and no LCL laxity. Tenderness found.     Left ankle: Normal.     Comments: Left knee: pain with flexion past 90 degrees, abduction, internal rotation, and external rotation.   Skin:    General: Skin is warm and dry.     Capillary Refill: Capillary refill takes less than 2 seconds.  Neurological:     General: No focal deficit present.     Mental Status: She is alert and oriented to person, place, and time.     Sensory: Sensation is intact.     Motor: Motor function is intact.    Psychiatric:        Attention and Perception: Attention and perception normal.        Mood and Affect: Mood and affect normal.        Speech: Speech normal.        Behavior: Behavior normal. Behavior is cooperative.        Thought Content: Thought content normal.        Cognition and Memory: Cognition and memory normal.        Judgment: Judgment normal.     Results for orders placed or performed in visit on 06/16/18  HM MAMMOGRAPHY  Result Value Ref Range   HM Mammogram 0-4 Bi-Rad 0-4 Bi-Rad, Self Reported Normal       Pertinent labs & imaging results that were available during my care of the patient were reviewed by me and considered in my medical decision making.  Assessment & Plan:  Kathrina was seen today for left knee pain and medication refill.  Diagnoses and all orders for this visit:  Chronic pain of left knee Symptoms concerning for osteoarthritis. Will treat with conservative measures for 4-6 weeks. Report any new or worsening symptoms. Use the Voltaren gel at least three times daily. Weight bearing exercises. Over the counter calcium and Vit D supplement. Mobic as prescribed. PT referral.  -     meloxicam (MOBIC) 15 MG tablet; Take 1 tablet (15 mg total) by mouth daily. -     Ambulatory referral to Physical Therapy  Chronic constipation -     linaclotide (LINZESS) 290 MCG CAPS capsule; Take 1 capsule (290 mcg total) by mouth daily before breakfast.  Gastroesophageal reflux disease, esophagitis presence not specified -     Dexlansoprazole (DEXILANT) 30 MG capsule; Take 1 capsule (30 mg total) by mouth daily.  Hormone replacement therapy (HRT) -     estrogen, conjugated,-medroxyprogesterone (PREMPRO) 0.3-1.5 MG tablet; Take 1 tablet by mouth daily.  Other orders -     valACYclovir (VALTREX) 1000 MG tablet; Take 1 tablet (1,000 mg total) by  mouth 2 (two) times daily.     Continue all other maintenance medications.  Follow up plan: Return in about 4 weeks  (around 08/13/2018), or if symptoms worsen or fail to improve.  Educational handout given for knee pain, osteoarthritis  The above assessment and management plan was discussed with the patient. The patient verbalized understanding of and has agreed to the management plan. Patient is aware to call the clinic if symptoms persist or worsen. Patient is aware when to return to the clinic for a follow-up visit. Patient educated on when it is appropriate to go to the emergency department.   Monia Pouch, FNP-C [facility] [phone]

## 2018-07-16 NOTE — Patient Instructions (Signed)

## 2018-07-28 DIAGNOSIS — S83242A Other tear of medial meniscus, current injury, left knee, initial encounter: Secondary | ICD-10-CM | POA: Diagnosis not present

## 2018-07-28 DIAGNOSIS — M25562 Pain in left knee: Secondary | ICD-10-CM | POA: Diagnosis not present

## 2018-08-13 ENCOUNTER — Ambulatory Visit: Payer: BLUE CROSS/BLUE SHIELD | Admitting: Family

## 2018-08-13 ENCOUNTER — Encounter: Payer: Self-pay | Admitting: Family

## 2018-08-13 VITALS — BP 131/84 | HR 83 | Temp 98.1°F | Ht 62.6 in | Wt 209.4 lb

## 2018-08-13 DIAGNOSIS — E669 Obesity, unspecified: Secondary | ICD-10-CM | POA: Diagnosis not present

## 2018-08-13 DIAGNOSIS — K5909 Other constipation: Secondary | ICD-10-CM

## 2018-08-13 DIAGNOSIS — M25562 Pain in left knee: Secondary | ICD-10-CM

## 2018-08-13 DIAGNOSIS — G8929 Other chronic pain: Secondary | ICD-10-CM

## 2018-08-13 DIAGNOSIS — Z713 Dietary counseling and surveillance: Secondary | ICD-10-CM

## 2018-08-13 DIAGNOSIS — Z6833 Body mass index (BMI) 33.0-33.9, adult: Secondary | ICD-10-CM

## 2018-08-13 MED ORDER — PHENTERMINE HCL 37.5 MG PO TABS: 37.5000 mg | ORAL_TABLET | Freq: Every day | ORAL | 2 refills | Status: DC

## 2018-08-13 MED ORDER — LINACLOTIDE 290 MCG PO CAPS: 290.0000 ug | ORAL_CAPSULE | Freq: Every day | ORAL | 3 refills | Status: DC

## 2018-08-13 NOTE — Progress Notes (Signed)
Subjective:    Patient ID: Angela Curry, female    DOB: 27-Sep-1950, 69 y.o.   MRN: [id number]  Chief Complaint  Patient presents with  . left knee pain   PT presents to the office today to recheck left knee pain.  She was seen in our office on 07/16/18 and started on mobic, voltaren gel,  and referral PT. She states her pain was worse and [facility] who gave her an injections and had a negative X-ray. She states her pain is significantly better since her injection and wearing her brace everyday.   She also states her since her knee pain she has not been active and has gained weight. She states she would like to retry the phentermine that has worked for her in the past. She states she has gained 25-30 lb over the last year. She has tried walking and eating healthier, but states she is not as active as has has been related to her knee.   Knee Pain   There was no injury mechanism. The pain is present in the left knee. The pain is at a severity of 2/10. The pain is mild. The pain has been intermittent since onset. She reports no foreign bodies present. The symptoms are aggravated by weight bearing. She has tried NSAIDs and rest for the symptoms. The treatment provided moderate relief.  Constipation  This is a chronic problem. The current episode started more than 1 year ago. The problem has been waxing and waning since onset. Her stool frequency is 1 time per day. Risk factors include immobility. The treatment provided moderate relief.      Review of Systems  Gastrointestinal: Positive for constipation.  Musculoskeletal: Positive for arthralgias.  All other systems reviewed and are negative.      Objective:   Physical Exam Vitals signs reviewed.  Constitutional:      General: She is not in acute distress.    Appearance: She is well-developed.  HENT:     Head: Normocephalic and atraumatic.  Eyes:     Pupils: Pupils are equal, round, and reactive to light.  Neck:   Musculoskeletal: Normal range of motion and neck supple.     Thyroid: No thyromegaly.  Cardiovascular:     Rate and Rhythm: Normal rate and regular rhythm.     Heart sounds: Normal heart sounds. No murmur.  Pulmonary:     Effort: Pulmonary effort is normal. No respiratory distress.     Breath sounds: Normal breath sounds. No wheezing.  Abdominal:     General: Bowel sounds are normal. There is no distension.     Palpations: Abdomen is soft.     Tenderness: There is no abdominal tenderness.  Musculoskeletal:        General: No tenderness.     Comments: Left knee brace present, full ROM, slightly medial left knee pain with flexion  Skin:    General: Skin is warm and dry.  Neurological:     Mental Status: She is alert and oriented to person, place, and time.     Cranial Nerves: No cranial nerve deficit.     Deep Tendon Reflexes: Reflexes are normal and symmetric.  Psychiatric:        Behavior: Behavior normal.        Thought Content: Thought content normal.        Judgment: Judgment normal.     BP 131/84   Pulse 83   Temp 98.1 F (36.7 C) (Oral)   Ht  5' 2.6" (1.59 m)   Wt 209 lb 6.4 oz (95 kg)   BMI 37.57 kg/m      Assessment & Plan:  Angela Curry comes in today with chief complaint of left knee pain   Diagnosis and orders addressed:  1. Chronic pain of left knee - CMP14+EGFR  2. Weight loss counseling, encounter for We will reorder Phentermine today Must lose 5% of weight to continue medication  Encourage healthy diet and regular exercise - phentermine (ADIPEX-P) 37.5 MG tablet; Take 1 tablet (37.5 mg total) by mouth daily before breakfast.  Dispense: 30 tablet; Refill: 2 - CMP14+EGFR  3. Class 1 obesity with body mass index (BMI) of 33.0 to 33.9 in adult, unspecified obesity type, unspecified whether serious comorbidity present - phentermine (ADIPEX-P) 37.5 MG tablet; Take 1 tablet (37.5 mg total) by mouth daily before breakfast.  Dispense: 30 tablet;  Refill: 2 - CMP14+EGFR  4. Chronic constipation Force fluids Continue Linzess - linaclotide (LINZESS) 290 MCG CAPS capsule; Take 1 capsule (290 mcg total) by mouth daily before breakfast.  Dispense: 90 capsule; Refill: 3 - CMP14+EGFR   Labs pending Health Maintenance reviewed Diet and exercise encouraged  Follow up plan: 3 months  [name], FNP

## 2018-08-13 NOTE — Patient Instructions (Signed)

## 2018-08-31 ENCOUNTER — Other Ambulatory Visit: Payer: Self-pay | Admitting: Family

## 2018-08-31 DIAGNOSIS — K219 Gastro-esophageal reflux disease without esophagitis: Secondary | ICD-10-CM

## 2018-09-09 ENCOUNTER — Encounter: Payer: Self-pay | Admitting: Family Medicine

## 2018-09-09 ENCOUNTER — Ambulatory Visit: Payer: BLUE CROSS/BLUE SHIELD | Admitting: Family Medicine

## 2018-09-09 VITALS — BP 119/83 | HR 107 | Temp 100.9°F | Ht 62.6 in | Wt 200.0 lb

## 2018-09-09 DIAGNOSIS — J111 Influenza due to unidentified influenza virus with other respiratory manifestations: Secondary | ICD-10-CM

## 2018-09-09 DIAGNOSIS — R52 Pain, unspecified: Secondary | ICD-10-CM | POA: Diagnosis not present

## 2018-09-09 LAB — VERITOR FLU A/B WAIVED
Influenza A: NEGATIVE
Influenza B: NEGATIVE

## 2018-09-09 MED ORDER — OSELTAMIVIR PHOSPHATE 75 MG PO CAPS: 75.0000 mg | ORAL_CAPSULE | Freq: Two times a day (BID) | ORAL | 0 refills | Status: DC

## 2018-09-09 NOTE — Progress Notes (Signed)
Chief Complaint  Patient presents with  . Cough    pt here today c/o cough, congestion, headache and body hurts all over    HPI  Patient presents today for patient presents with dry cough runny stuffy nose. Diffuse headache of moderate intensity. Patient also has chills and subjective fever. Body aches worst in the back but present in the legs, shoulders, and torso as well. Has sapped the energy . Onset 3 days ago.   PMH: Smoking status noted ROS: Per HPI  Objective: BP 119/83   Pulse (!) 107   Temp (!) 100.9 F (38.3 C) (Oral)   Ht 5' 2.6" (1.59 m)   Wt 200 lb (90.7 kg)   BMI 35.88 kg/m  Gen: NAD, alert, cooperative with exam HEENT: NCAT, EOMI, PERRL CV: RRR, good S1/S2, no murmur Resp: CTABL, no wheezes, non-labored Abd: SNTND, BS present, no guarding or organomegaly Ext: No edema, warm Neuro: Alert and oriented, No gross deficits  Assessment and plan:  1. Body aches     No orders of the defined types were placed in this encounter.   Orders Placed This Encounter  Procedures  . Veritor Flu A/B Waived    Order Specific Question:   Source    Answer:   nasal    Follow up as needed.  Claretta Fraise, MD

## 2018-09-23 ENCOUNTER — Ambulatory Visit: Payer: BLUE CROSS/BLUE SHIELD | Admitting: Physician Assistant

## 2018-09-23 ENCOUNTER — Other Ambulatory Visit: Payer: Self-pay

## 2018-09-23 ENCOUNTER — Ambulatory Visit (INDEPENDENT_AMBULATORY_CARE_PROVIDER_SITE_OTHER): Payer: BLUE CROSS/BLUE SHIELD

## 2018-09-23 ENCOUNTER — Encounter: Payer: Self-pay | Admitting: Physician Assistant

## 2018-09-23 VITALS — BP 139/91 | HR 88 | Temp 98.2°F | Ht 62.6 in | Wt 202.6 lb

## 2018-09-23 DIAGNOSIS — S29011A Strain of muscle and tendon of front wall of thorax, initial encounter: Secondary | ICD-10-CM | POA: Diagnosis not present

## 2018-09-23 DIAGNOSIS — R109 Unspecified abdominal pain: Secondary | ICD-10-CM | POA: Diagnosis not present

## 2018-09-23 DIAGNOSIS — R05 Cough: Secondary | ICD-10-CM | POA: Diagnosis not present

## 2018-09-23 DIAGNOSIS — R0781 Pleurodynia: Secondary | ICD-10-CM | POA: Diagnosis not present

## 2018-09-23 MED ORDER — CYCLOBENZAPRINE HCL 10 MG PO TABS: 10.0000 mg | ORAL_TABLET | Freq: Three times a day (TID) | ORAL | 0 refills | Status: DC | PRN

## 2018-09-23 MED ORDER — METHYLPREDNISOLONE ACETATE 80 MG/ML IJ SUSP
80.0000 mg | Freq: Once | INTRAMUSCULAR | Status: AC
  Administered 2018-09-23: 80 mg via INTRAMUSCULAR

## 2018-09-25 DIAGNOSIS — R0902 Hypoxemia: Secondary | ICD-10-CM | POA: Diagnosis not present

## 2018-09-25 DIAGNOSIS — I10 Essential (primary) hypertension: Secondary | ICD-10-CM | POA: Diagnosis not present

## 2018-09-25 DIAGNOSIS — R079 Chest pain, unspecified: Secondary | ICD-10-CM | POA: Diagnosis not present

## 2018-09-25 NOTE — Progress Notes (Signed)
BP (!) 139/91   Pulse 88   Temp 98.2 F (36.8 C) (Oral)   Ht 5' 2.6" (1.59 m)   Wt 202 lb 9.6 oz (91.9 kg)   BMI 36.35 kg/m    Subjective:    Patient ID: Angela Curry, female    DOB: 15-Jun-1951, 68 y.o.   MRN: [id number]  HPI: Angela Curry is a 68 y.o. female presenting on 09/23/2018 for Flank Pain (right )  This patient has had multiple illnesses over the past month and has had a severe cough for a couple of weeks now.  The left side was bothering her in the similar way but it has improved however the right side has not improved.  She is still having frequent spells of coughing.  She has them also at night when she lays down.  It is nonproductive.  She denies any fever or chills, no nausea vomiting or diarrhea.  Past Medical History:  Diagnosis Date  . Asthma   . Dysphagia   . Fibrocystic breast disease   . GERD (gastroesophageal reflux disease)   . H/O cold sores   . Hiatal hernia   . Osteopenia   . Psoriasis   . Vitamin D deficiency    Relevant past medical, surgical, family and social history reviewed and updated as indicated. Interim medical history since our last visit reviewed. Allergies and medications reviewed and updated. DATA REVIEWED: CHART IN EPIC  Family History reviewed for pertinent findings.  Review of Systems  Allergies as of 09/23/2018   No Known Allergies     Medication List       Accurate as of September 23, 2018 11:59 PM. Always use your most recent med list.        calcipotriene 0.005 % cream Commonly known as:  DOVONOX Apply topically 2 (two) times daily.   clobetasol ointment 0.05 % Commonly known as:  TEMOVATE Apply topically daily.   cyclobenzaprine 10 MG tablet Commonly known as:  FLEXERIL Take 1 tablet (10 mg total) by mouth 3 (three) times daily as needed for muscle spasms.   Dexilant 30 MG capsule Generic drug:  Dexlansoprazole Take 1 capsule (30 mg total) by mouth daily.   diclofenac sodium 1 % Gel  Commonly known as:  Voltaren Apply 2 g topically 4 (four) times daily.   estrogen (conjugated)-medroxyprogesterone 0.3-1.5 MG tablet Commonly known as:  Prempro Take 1 tablet by mouth daily.   fluticasone 50 MCG/ACT nasal spray Commonly known as:  FLONASE Place 2 sprays into both nostrils daily.   folic acid 1 MG tablet Commonly known as:  FOLVITE Take 1 tablet (1 mg total) by mouth daily. (Needs to be seen)   linaclotide 290 MCG Caps capsule Commonly known as:  LINZESS Take 1 capsule (290 mcg total) by mouth daily before breakfast.   meloxicam 15 MG tablet Commonly known as:  MOBIC Take 1 tablet (15 mg total) by mouth daily.   metroNIDAZOLE 1 % gel Commonly known as:  METROGEL Apply topically daily.   phentermine 37.5 MG tablet Commonly known as:  ADIPEX-P Take 1 tablet (37.5 mg total) by mouth daily before breakfast.   valACYclovir 1000 MG tablet Commonly known as:  VALTREX Take 1 tablet (1,000 mg total) by mouth 2 (two) times daily.   Vitamin D (Ergocalciferol) 1.25 MG (50000 UT) Caps capsule Commonly known as:  DRISDOL TAKE 1 CAPSULE ONCE A WEEK          Objective:    BP Marland Kitchen)  139/91   Pulse 88   Temp 98.2 F (36.8 C) (Oral)   Ht 5' 2.6" (1.59 m)   Wt 202 lb 9.6 oz (91.9 kg)   BMI 36.35 kg/m   No Known Allergies  Wt Readings from Last 3 Encounters:  09/23/18 202 lb 9.6 oz (91.9 kg)  09/09/18 200 lb (90.7 kg)  08/13/18 209 lb 6.4 oz (95 kg)    Physical Exam Constitutional:      Appearance: She is well-developed.  HENT:     Head: Normocephalic and atraumatic.  Eyes:     Conjunctiva/sclera: Conjunctivae normal.     Pupils: Pupils are equal, round, and reactive to light.  Cardiovascular:     Rate and Rhythm: Normal rate and regular rhythm.     Heart sounds: Normal heart sounds.  Pulmonary:     Effort: Pulmonary effort is normal.     Breath sounds: Normal breath sounds.  Abdominal:     General: Bowel sounds are normal.     Palpations: Abdomen  is soft.  Skin:    General: Skin is warm and dry.     Findings: No rash.  Neurological:     Mental Status: She is alert and oriented to person, place, and time.     Deep Tendon Reflexes: Reflexes are normal and symmetric.  Psychiatric:        Behavior: Behavior normal.        Thought Content: Thought content normal.        Judgment: Judgment normal.     Results for orders placed or performed in visit on 09/09/18  Veritor Flu A/B Waived  Result Value Ref Range   Influenza A Negative Negative   Influenza B Negative Negative      Assessment & Plan:   1. Side pain - DG Ribs Unilateral W/Chest Right; Future - cyclobenzaprine (FLEXERIL) 10 MG tablet; Take 1 tablet (10 mg total) by mouth 3 (three) times daily as needed for muscle spasms.  Dispense: 30 tablet; Refill: 0 - methylPREDNISolone acetate (DEPO-MEDROL) injection 80 mg  2. Muscle strain of chest wall, initial encounter - cyclobenzaprine (FLEXERIL) 10 MG tablet; Take 1 tablet (10 mg total) by mouth 3 (three) times daily as needed for muscle spasms.  Dispense: 30 tablet; Refill: 0 - methylPREDNISolone acetate (DEPO-MEDROL) injection 80 mg   Continue all other maintenance medications as listed above.  Follow up plan: Return if symptoms worsen or fail to improve.  Educational handout given for [place] PA-C [place] [address]  [place], [place] [address] [phone]   09/25/2018, 7:58 AM

## 2018-10-20 ENCOUNTER — Encounter: Payer: Self-pay | Admitting: Family Medicine

## 2018-11-17 ENCOUNTER — Ambulatory Visit: Payer: BLUE CROSS/BLUE SHIELD | Admitting: Family

## 2018-12-24 ENCOUNTER — Ambulatory Visit: Payer: BLUE CROSS/BLUE SHIELD | Admitting: Family

## 2018-12-24 ENCOUNTER — Encounter: Payer: Self-pay | Admitting: Family

## 2018-12-24 VITALS — BP 141/91 | HR 77 | Temp 98.1°F | Ht 62.0 in | Wt 200.0 lb

## 2018-12-24 DIAGNOSIS — E669 Obesity, unspecified: Secondary | ICD-10-CM

## 2018-12-24 DIAGNOSIS — D171 Benign lipomatous neoplasm of skin and subcutaneous tissue of trunk: Secondary | ICD-10-CM

## 2018-12-24 DIAGNOSIS — L409 Psoriasis, unspecified: Secondary | ICD-10-CM

## 2018-12-24 DIAGNOSIS — K5909 Other constipation: Secondary | ICD-10-CM

## 2018-12-24 DIAGNOSIS — E785 Hyperlipidemia, unspecified: Secondary | ICD-10-CM

## 2018-12-24 DIAGNOSIS — K219 Gastro-esophageal reflux disease without esophagitis: Secondary | ICD-10-CM | POA: Diagnosis not present

## 2018-12-24 NOTE — Progress Notes (Signed)
Subjective:    Patient ID: Angela Curry, female    DOB: 25-Jan-1951, 68 y.o.   MRN: [id number]  Chief Complaint  Patient presents with  . Medical Management of Chronic Issues   Pt presents to the office today for chronic follow up. Pt complaining of right nodule on her lower ribs. States she had a flu like virus in March and was coughing for weeks at a time and devolved right lower rib pain. She then found the nodule. She had a negative x-ray for fracture on 09/23/18 and states her pain has improved, but still has it at times. She is concerned over the nodule.  Gastroesophageal Reflux She reports no belching, no coughing or no heartburn. This is a chronic problem. The current episode started more than 1 year ago. The problem occurs occasionally. The problem has been waxing and waning. The symptoms are aggravated by certain foods. Risk factors include obesity. She has tried a PPI for the symptoms. The treatment provided moderate relief.  Constipation This is a chronic problem. The current episode started more than 1 year ago. The problem has been waxing and waning since onset. Her stool frequency is 4 to 5 times per week. She has tried laxatives for the symptoms.  Arthritis Presents for follow-up visit. She complains of pain and joint warmth. The symptoms have been worsening. Affected locations include the right knee and left knee. Her pain is at a severity of 5/10.  Hyperlipidemia This is a chronic problem. The current episode started more than 1 year ago. Exacerbating diseases include obesity. Current antihyperlipidemic treatment includes statins. The current treatment provides moderate improvement of lipids. Risk factors for coronary artery disease include dyslipidemia and a sedentary lifestyle.  Psoriasis Plaque rash on left elbow and mildly on right elbow. Using calcipotriene and clobetasol as needed.    Review of Systems  Respiratory: Negative for cough.   Gastrointestinal:  Positive for constipation. Negative for heartburn.  Musculoskeletal: Positive for arthritis.  All other systems reviewed and are negative.      Objective:   Physical Exam Vitals signs reviewed.  Constitutional:      General: She is not in acute distress.    Appearance: She is well-developed.  HENT:     Head: Normocephalic and atraumatic.     Right Ear: External ear normal.  Eyes:     Pupils: Pupils are equal, round, and reactive to light.  Neck:     Musculoskeletal: Normal range of motion and neck supple.     Thyroid: No thyromegaly.  Cardiovascular:     Rate and Rhythm: Normal rate and regular rhythm.     Heart sounds: Normal heart sounds. No murmur.  Pulmonary:     Effort: Pulmonary effort is normal. No respiratory distress.     Breath sounds: Normal breath sounds. No wheezing.  Abdominal:     General: Bowel sounds are normal. There is no distension.     Palpations: Abdomen is soft.     Tenderness: There is no abdominal tenderness.  Musculoskeletal: Normal range of motion.        General: No tenderness.  Skin:    General: Skin is warm and dry.     Comments: Approx  2 cm nodule on right lower ribs, nontender  Neurological:     Mental Status: She is alert and oriented to person, place, and time.     Cranial Nerves: No cranial nerve deficit.     Deep Tendon Reflexes: Reflexes are normal and  symmetric.  Psychiatric:        Behavior: Behavior normal.        Thought Content: Thought content normal.        Judgment: Judgment normal.       BP (!) 141/91   Pulse 77   Temp 98.1 F (36.7 C) (Oral)   Ht 5' 2"  (1.575 m)   Wt 200 lb (90.7 kg)   BMI 36.58 kg/m      Assessment & Plan:  Angela Curry comes in today with chief complaint of Medical Management of Chronic Issues   Diagnosis and orders addressed:  1. Chronic constipation - CMP14+EGFR - CBC with Differential/Platelet  2. Gastroesophageal reflux disease, esophagitis presence not specified -  CMP14+EGFR - CBC with Differential/Platelet  3. Hyperlipidemia, unspecified hyperlipidemia type - CMP14+EGFR - CBC with Differential/Platelet  4. Obesity (BMI 30-39.9) - CMP14+EGFR - CBC with Differential/Platelet  5. Psoriasis - CMP14+EGFR - CBC with Differential/Platelet  6. Lipoma of torso Continue to monitor, if becomes larger or changes will do [place]   Labs pending Health Maintenance reviewed Diet and exercise encouraged  Follow up plan: 6 months   [name], FNP

## 2018-12-24 NOTE — Patient Instructions (Signed)
Lipoma    A lipoma is a noncancerous (benign) tumor that is made up of fat cells. This is a very common type of soft-tissue growth. Lipomas are usually found under the skin (subcutaneous). They may occur in any tissue of the body that contains fat. Common areas for lipomas to appear include the back, shoulders, buttocks, and thighs.   Lipomas grow slowly, and they are usually painless. Most lipomas do not cause problems and do not require treatment.  What are the causes?  The cause of this condition is not known.  What increases the risk?  You are more likely to develop this condition if:   You are 40-60 years old.   You have a family history of lipomas.  What are the signs or symptoms?  A lipoma usually appears as a small, round bump under the skin. In most cases, the lump will:   Feel soft or rubbery.   Not cause pain or other symptoms.  However, if a lipoma is located in an area where it pushes on nerves, it can become painful or cause other symptoms.  How is this diagnosed?  A lipoma can usually be diagnosed with a physical exam. You may also have tests to confirm the diagnosis and to rule out other conditions. Tests may include:   Imaging tests, such as a CT scan or MRI.   Removal of a tissue sample to be looked at under a microscope (biopsy).  How is this treated?  Treatment for this condition depends on the size of the lipoma and whether it is causing any symptoms.   For small lipomas that are not causing problems, no treatment is needed.   If a lipoma is bigger or it causes problems, surgery may be done to remove the lipoma. Lipomas can also be removed to improve appearance. Most often, the procedure is done after applying a medicine that numbs the area (local anesthetic).  Follow these instructions at home:   Watch your lipoma for any changes.   Keep all follow-up visits as told by your health care provider. This is important.  Contact a health care provider if:   Your lipoma becomes larger or  hard.   Your lipoma becomes painful, red, or increasingly swollen. These could be signs of infection or a more serious condition.  Get help right away if:   You develop tingling or numbness in an area near the lipoma. This could indicate that the lipoma is causing nerve damage.  Summary   A lipoma is a noncancerous tumor that is made up of fat cells.   Most lipomas do not cause problems and do not require treatment.   If a lipoma is bigger or it causes problems, surgery may be done to remove the lipoma.  This information is not intended to replace advice given to you by your health care provider. Make sure you discuss any questions you have with your health care provider.  Document Released: 06/21/2002 Document Revised: 06/17/2017 Document Reviewed: 06/17/2017  Elsevier Interactive Patient Education  2019 Elsevier Inc[employer]

## 2018-12-25 LAB — CBC WITH DIFFERENTIAL/PLATELET
Basophils Absolute: 0 10*3/uL (ref 0.0–0.2)
Basos: 1 %
EOS (ABSOLUTE): 0.1 10*3/uL (ref 0.0–0.4)
Eos: 2 %
Hematocrit: 41.1 % (ref 34.0–46.6)
Hemoglobin: 13.2 g/dL (ref 11.1–15.9)
Immature Grans (Abs): 0 10*3/uL (ref 0.0–0.1)
Immature Granulocytes: 0 %
Lymphocytes Absolute: 1.3 10*3/uL (ref 0.7–3.1)
Lymphs: 25 %
MCH: 27.4 pg (ref 26.6–33.0)
MCHC: 32.1 g/dL (ref 31.5–35.7)
MCV: 85 fL (ref 79–97)
Monocytes Absolute: 0.6 10*3/uL (ref 0.1–0.9)
Monocytes: 12 %
Neutrophils Absolute: 2.9 10*3/uL (ref 1.4–7.0)
Neutrophils: 60 %
Platelets: 324 10*3/uL (ref 150–450)
RBC: 4.82 x10E6/uL (ref 3.77–5.28)
RDW: 13.9 % (ref 11.7–15.4)
WBC: 4.9 10*3/uL (ref 3.4–10.8)

## 2018-12-25 LAB — CMP14+EGFR
ALT: 44 IU/L — ABNORMAL HIGH (ref 0–32)
AST: 33 IU/L (ref 0–40)
Albumin/Globulin Ratio: 1.8 (ref 1.2–2.2)
Albumin: 4.3 g/dL (ref 3.8–4.8)
Alkaline Phosphatase: 119 IU/L — ABNORMAL HIGH (ref 39–117)
BUN/Creatinine Ratio: 17 (ref 12–28)
BUN: 11 mg/dL (ref 8–27)
Bilirubin Total: 0.3 mg/dL (ref 0.0–1.2)
CO2: 22 mmol/L (ref 20–29)
Calcium: 9.4 mg/dL (ref 8.7–10.3)
Chloride: 104 mmol/L (ref 96–106)
Creatinine, Ser: 0.66 mg/dL (ref 0.57–1.00)
GFR calc Af Amer: 106 mL/min/{1.73_m2} (ref >59)
GFR calc non Af Amer: 92 mL/min/{1.73_m2} (ref >59)
Globulin, Total: 2.4 g/dL (ref 1.5–4.5)
Glucose: 112 mg/dL — ABNORMAL HIGH (ref 65–99)
Potassium: 4.6 mmol/L (ref 3.5–5.2)
Sodium: 140 mmol/L (ref 134–144)
Total Protein: 6.7 g/dL (ref 6.0–8.5)

## 2018-12-29 DIAGNOSIS — Z03818 Encounter for observation for suspected exposure to other biological agents ruled out: Secondary | ICD-10-CM | POA: Diagnosis not present

## 2019-01-02 ENCOUNTER — Other Ambulatory Visit: Payer: Self-pay | Admitting: Family

## 2019-01-02 DIAGNOSIS — K219 Gastro-esophageal reflux disease without esophagitis: Secondary | ICD-10-CM

## 2019-01-21 ENCOUNTER — Telehealth: Payer: Self-pay | Admitting: Family

## 2019-01-21 DIAGNOSIS — K5904 Chronic idiopathic constipation: Secondary | ICD-10-CM

## 2019-01-21 MED ORDER — LUBIPROSTONE 24 MCG PO CAPS: 24.0000 ug | ORAL_CAPSULE | Freq: Two times a day (BID) | ORAL | 1 refills | Status: DC

## 2019-01-21 NOTE — Telephone Encounter (Signed)
Prescription sent to pharmacy.

## 2019-01-21 NOTE — Telephone Encounter (Signed)
Please review and advise.

## 2019-01-22 ENCOUNTER — Telehealth: Payer: Self-pay | Admitting: *Deleted

## 2019-01-22 NOTE — Telephone Encounter (Addendum)
Prior Auth for Amitiza 55mcg-DENIED-reference # [id number]   This medication is approved for members who have long term constipation of unknown causes (chronic idiopathic constipation) when Trulance has been tried and did not work or was not tolerated.  In this case, Trulance has not been tried. Please note Trulance needs a separate prior approval request.  If you would like to appeal please call [phone] ext [phone].       Your information has been submitted to [place]. [employer] [place] will review the request and fax you a determination directly, typically within 3 business days of your submission once all necessary information is received.  If [employer] [place] has not responded in 3 business days or if you have any questions about your submission, contact [place] at [phone].

## 2019-01-22 NOTE — Telephone Encounter (Signed)
PT states this can be approved per her insurance. Can we try to get this approved?

## 2019-01-22 NOTE — Telephone Encounter (Signed)
Insurance Denied Amitiza-non preferred  Preferred are Trulance or [employer]

## 2019-01-26 MED ORDER — TRULANCE 3 MG PO TABS: 3.0000 mg | ORAL_TABLET | Freq: Every day | ORAL | 1 refills | Status: DC

## 2019-01-26 NOTE — Telephone Encounter (Signed)
Prescription sent to pharmacy.

## 2019-01-26 NOTE — Telephone Encounter (Signed)
Patient aware.

## 2019-01-26 NOTE — Addendum Note (Signed)
Addended by: [name] A on: 01/26/2019 03:07 PM   Modules accepted: Orders

## 2019-01-26 NOTE — Telephone Encounter (Signed)
Please let patient know.

## 2019-02-01 ENCOUNTER — Other Ambulatory Visit: Payer: Self-pay | Admitting: Family

## 2019-02-01 DIAGNOSIS — G8929 Other chronic pain: Secondary | ICD-10-CM

## 2019-02-04 ENCOUNTER — Telehealth: Payer: Self-pay | Admitting: *Deleted

## 2019-02-04 NOTE — Telephone Encounter (Signed)
Prior Auth for Trulance 3mg -APPROVED  Approved 02/04/19-02/03/20

## 2019-03-03 ENCOUNTER — Other Ambulatory Visit: Payer: Self-pay | Admitting: *Deleted

## 2019-03-03 NOTE — Telephone Encounter (Signed)
Fax from [employer] 3 mg 1 QD Pt would like 90 d supply Please issue or advise

## 2019-03-04 MED ORDER — TRULANCE 3 MG PO TABS: 3.0000 mg | ORAL_TABLET | Freq: Every day | ORAL | 1 refills | Status: DC

## 2019-04-07 ENCOUNTER — Other Ambulatory Visit: Payer: Self-pay | Admitting: Family

## 2019-04-07 MED ORDER — TRULANCE 3 MG PO TABS: 3.0000 mg | ORAL_TABLET | Freq: Every day | ORAL | 1 refills | Status: DC

## 2019-04-22 DIAGNOSIS — Z20828 Contact with and (suspected) exposure to other viral communicable diseases: Secondary | ICD-10-CM | POA: Diagnosis not present

## 2019-05-21 ENCOUNTER — Other Ambulatory Visit: Payer: Self-pay

## 2019-05-24 ENCOUNTER — Other Ambulatory Visit: Payer: Self-pay

## 2019-05-24 ENCOUNTER — Ambulatory Visit (INDEPENDENT_AMBULATORY_CARE_PROVIDER_SITE_OTHER): Payer: BC Managed Care – PPO

## 2019-05-24 DIAGNOSIS — Z23 Encounter for immunization: Secondary | ICD-10-CM | POA: Diagnosis not present

## 2019-08-13 ENCOUNTER — Ambulatory Visit: Payer: BLUE CROSS/BLUE SHIELD

## 2019-08-18 ENCOUNTER — Ambulatory Visit: Payer: BLUE CROSS/BLUE SHIELD

## 2019-08-19 ENCOUNTER — Ambulatory Visit: Payer: BLUE CROSS/BLUE SHIELD

## 2019-08-19 ENCOUNTER — Ambulatory Visit: Payer: Self-pay | Attending: Internal Medicine

## 2019-08-19 DIAGNOSIS — Z23 Encounter for immunization: Secondary | ICD-10-CM | POA: Insufficient documentation

## 2019-08-19 NOTE — Progress Notes (Signed)
   Covid-19 Vaccination Clinic  Name:  MAEOLA HOLDRIDGE    MRN: [id number] DOB: [birth year]  08/19/2019  Ms. Scragg was observed post Covid-19 immunization for 15 minutes without incidence. She was provided with Vaccine Information Sheet and instruction to access the V-Safe system.   Ms. Lanzi was instructed to call 911 with any severe reactions post vaccine: Marland Kitchen Difficulty breathing  . Swelling of your face and throat  . A fast heartbeat  . A bad rash all over your body  . Dizziness and weakness    Immunizations Administered    Name Date Dose VIS Date Route   Moderna COVID-19 Vaccine 08/19/2019 12:40 PM 0.5 mL 06/15/2019 Intramuscular   Manufacturer: Moderna   Lot: [id number]   [id number]

## 2019-09-20 ENCOUNTER — Ambulatory Visit: Payer: Self-pay | Attending: Internal Medicine

## 2019-09-20 DIAGNOSIS — Z23 Encounter for immunization: Secondary | ICD-10-CM | POA: Insufficient documentation

## 2019-09-20 NOTE — Progress Notes (Signed)
   Covid-19 Vaccination Clinic  Name:  Angela Curry    MRN: [id number] DOB: February 14, 1951  09/20/2019  Angela Curry was observed post Covid-19 immunization for 15 minutes without incident. She was provided with Vaccine Information Sheet and instruction to access the V-Safe system.   Angela Curry was instructed to call 911 with any severe reactions post vaccine: Marland Kitchen Difficulty breathing  . Swelling of face and throat  . A fast heartbeat  . A bad rash all over body  . Dizziness and weakness   Immunizations Administered    Name Date Dose VIS Date Route   Moderna COVID-19 Vaccine 09/20/2019 12:10 PM 0.5 mL 06/15/2019 Intramuscular   Manufacturer: Moderna   Lot: [id number]   BertholdVO:[id number]

## 2019-10-09 ENCOUNTER — Other Ambulatory Visit: Payer: Self-pay | Admitting: Family

## 2019-11-11 ENCOUNTER — Other Ambulatory Visit: Payer: Self-pay

## 2019-11-11 MED ORDER — VALACYCLOVIR HCL 1 G PO TABS: 1000.0000 mg | ORAL_TABLET | Freq: Two times a day (BID) | ORAL | 0 refills | Status: DC

## 2019-11-11 MED ORDER — TRULANCE 3 MG PO TABS: 1.0000 | ORAL_TABLET | Freq: Every day | ORAL | 0 refills | Status: DC

## 2019-12-06 ENCOUNTER — Ambulatory Visit: Payer: BC Managed Care – PPO | Admitting: Family

## 2019-12-06 ENCOUNTER — Encounter: Payer: Self-pay | Admitting: Family

## 2019-12-06 ENCOUNTER — Other Ambulatory Visit: Payer: Self-pay

## 2019-12-06 VITALS — BP 128/74 | HR 76 | Temp 98.4°F | Ht 62.0 in | Wt 216.0 lb

## 2019-12-06 DIAGNOSIS — G8929 Other chronic pain: Secondary | ICD-10-CM | POA: Diagnosis not present

## 2019-12-06 DIAGNOSIS — M25562 Pain in left knee: Secondary | ICD-10-CM

## 2019-12-06 DIAGNOSIS — Z1211 Encounter for screening for malignant neoplasm of colon: Secondary | ICD-10-CM

## 2019-12-06 DIAGNOSIS — K5909 Other constipation: Secondary | ICD-10-CM | POA: Diagnosis not present

## 2019-12-06 DIAGNOSIS — E669 Obesity, unspecified: Secondary | ICD-10-CM

## 2019-12-06 DIAGNOSIS — E785 Hyperlipidemia, unspecified: Secondary | ICD-10-CM

## 2019-12-06 DIAGNOSIS — K219 Gastro-esophageal reflux disease without esophagitis: Secondary | ICD-10-CM

## 2019-12-06 DIAGNOSIS — L409 Psoriasis, unspecified: Secondary | ICD-10-CM

## 2019-12-06 DIAGNOSIS — R29898 Other symptoms and signs involving the musculoskeletal system: Secondary | ICD-10-CM

## 2019-12-06 MED ORDER — CALCIPOTRIENE 0.005 % EX CREA: TOPICAL_CREAM | Freq: Two times a day (BID) | CUTANEOUS | 0 refills | Status: DC

## 2019-12-06 MED ORDER — DEXILANT 30 MG PO CPDR: DELAYED_RELEASE_CAPSULE | ORAL | 1 refills | Status: DC

## 2019-12-06 MED ORDER — TRULANCE 3 MG PO TABS: 1.0000 | ORAL_TABLET | Freq: Every day | ORAL | 3 refills | Status: DC

## 2019-12-06 MED ORDER — CLOBETASOL PROPIONATE 0.05 % EX OINT: TOPICAL_OINTMENT | Freq: Every day | CUTANEOUS | 2 refills | Status: DC

## 2019-12-06 MED ORDER — VALACYCLOVIR HCL 1 G PO TABS: 1000.0000 mg | ORAL_TABLET | Freq: Two times a day (BID) | ORAL | 0 refills | Status: DC

## 2019-12-06 MED ORDER — CYCLOBENZAPRINE HCL 10 MG PO TABS: 10.0000 mg | ORAL_TABLET | Freq: Three times a day (TID) | ORAL | 3 refills | Status: DC | PRN

## 2019-12-06 MED ORDER — MELOXICAM 15 MG PO TABS: 15.0000 mg | ORAL_TABLET | Freq: Every day | ORAL | 3 refills | Status: AC

## 2019-12-06 NOTE — Progress Notes (Signed)
 Subjective:    Patient ID: Angela Curry, female    DOB: 10/18/1950, 68 y.o.   MRN: [id number]  Chief Complaint  Patient presents with  . Medical Management of Chronic Issues    refills   Pt presents to the office today for chronic follow up. Pt is complaining of bilateral leg weakness that started 07/2018 and left knee pain.  Gastroesophageal Reflux She complains of belching and heartburn. This is a chronic problem. The current episode started more than 1 year ago. The problem occurs occasionally. The problem has been waxing and waning. The symptoms are aggravated by certain foods. Risk factors include obesity. She has tried a PPI for the symptoms. The treatment provided moderate relief.  Constipation This is a chronic problem. The current episode started more than 1 year ago. The problem has been waxing and waning since onset. Her stool frequency is 1 time per day. She has tried laxatives for the symptoms. The treatment provided moderate relief.  Hyperlipidemia This is a chronic problem. The current episode started more than 1 year ago. The problem is uncontrolled. Recent lipid tests were reviewed and are high. Exacerbating diseases include obesity. Current antihyperlipidemic treatment includes diet change. The current treatment provides no improvement of lipids. Risk factors for coronary artery disease include dyslipidemia, a sedentary lifestyle and post-menopausal.  Arthritis Presents for follow-up visit. She complains of pain and stiffness. The symptoms have been stable. Affected locations include the left knee. Her pain is at a severity of 4/10.      Review of Systems  Gastrointestinal: Positive for constipation and heartburn.  Musculoskeletal: Positive for arthritis and stiffness.  All other systems reviewed and are negative.      Objective:   Physical Exam Vitals reviewed.  Constitutional:      General: She is not in acute distress.    Appearance: She is  well-developed. She is obese.  HENT:     Head: Normocephalic and atraumatic.     Right Ear: Tympanic membrane normal.     Left Ear: Tympanic membrane normal.  Eyes:     Pupils: Pupils are equal, round, and reactive to light.  Neck:     Thyroid: No thyromegaly.  Cardiovascular:     Rate and Rhythm: Normal rate and regular rhythm.     Heart sounds: Normal heart sounds. No murmur.  Pulmonary:     Effort: Pulmonary effort is normal. No respiratory distress.     Breath sounds: Normal breath sounds. No wheezing.  Abdominal:     General: Bowel sounds are normal. There is no distension.     Palpations: Abdomen is soft.     Tenderness: There is no abdominal tenderness.  Musculoskeletal:        General: No tenderness. Normal range of motion.     Cervical back: Normal range of motion and neck supple.  Skin:    General: Skin is warm and dry.  Neurological:     Mental Status: She is alert and oriented to person, place, and time.     Cranial Nerves: No cranial nerve deficit.     Deep Tendon Reflexes: Reflexes are normal and symmetric.  Psychiatric:        Behavior: Behavior normal.        Thought Content: Thought content normal.        Judgment: Judgment normal.     BP 128/74   Pulse 76   Temp 98.4 F (36.9 C) (Temporal)   Ht 5' 2" (1.575 m)     Wt 216 lb (98 kg)   SpO2 97%   BMI 39.51 kg/m        Assessment & Plan:  Angela Curry comes in today with chief complaint of Medical Management of Chronic Issues (refills)   Diagnosis and orders addressed:  1. Gastroesophageal reflux disease, unspecified whether esophagitis present - CMP14+EGFR - CBC with Differential/Platelet - Dexlansoprazole (DEXILANT) 30 MG capsule; Take 1 capsule (30 mg total) by mouth daily.  Dispense: 90 capsule; Refill: 1  2. Chronic constipation - CMP14+EGFR - CBC with Differential/Platelet - Plecanatide (TRULANCE) 3 MG TABS; Take 1 tablet by mouth daily. (Needs to be seen before next refill)   Dispense: 90 tablet; Refill: 3  3. Chronic pain of left knee - CMP14+EGFR - CBC with Differential/Platelet - Ambulatory referral to Physical Therapy - meloxicam (MOBIC) 15 MG tablet; Take 1 tablet (15 mg total) by mouth daily.  Dispense: 90 tablet; Refill: 3  4. Obesity (BMI 30-39.9) - CMP14+EGFR - CBC with Differential/Platelet  5. Hyperlipidemia, unspecified hyperlipidemia type - CMP14+EGFR - CBC with Differential/Platelet - Lipid panel  6. Colon cancer screening - Ambulatory referral to Gastroenterology - CMP14+EGFR - CBC with Differential/Platelet  7. Weakness of both lower extremities - Ambulatory referral to Physical Therapy  8. Psoriasis - calcipotriene (DOVONOX) 0.005 % cream; Apply topically 2 (two) times daily.  Dispense: 60 g; Refill: 0   Labs pending Health Maintenance reviewed Diet and exercise encouraged  Follow up plan: 6 months   Christy Hawks, FNP  

## 2019-12-06 NOTE — Patient Instructions (Signed)
Lateral Collateral Knee Ligament Sprain, Phase II Rehab These exercises are more advanced than phase I exercises. Ask your health care provider which exercises are safe for you. Do exercises exactly as told by your health care provider and adjust them as directed. It is normal to feel mild stretching, pulling, tightness, or discomfort as you do these exercises. Stop right away if you feel sudden pain or your pain gets worse. Do not begin these exercises until told by your health care provider. Stretching and range-of-motion exercise This exercise warms up your muscles and joints and improves the movement and flexibility of your knee. This exercise also helps to relieve pain and stiffness. Quadriceps stretch, prone  1. Lie on your abdomen on a firm surface, such as a bed or padded floor. This is the prone position. 2. Bend your left / right knee and reach back to hold your ankle or pant leg. If you cannot reach your ankle or pant leg, loop a belt around your foot and grab the belt instead. 3. Gently pull your heel toward your buttocks. Your knee should not slide out to the side. You should feel a stretch in the front of your thigh and knee (quadriceps). 4. Hold this position for __________ seconds. Repeat __________ times. Complete this exercise __________ times a day. Strengthening exercises These exercises build strength and endurance in your knee. Endurance is the ability to use your muscles for a long time, even after they get tired. Bridge walkouts This exercise strengthens the muscles in the buttocks, the thigh (hip extensors and hip abductors), and the knee (knee flexors). 1. Lie on your back on a firm surface with your knees bent and your feet flat on the floor. 2. Tighten your buttocks muscles and lift your bottom off the floor until your trunk is level with your thighs. 3. Keep your hips steady as you walk your feet away from your body as far as you can. 4. Without resting, walk your feet  back to the starting position. 5. Slowly lower your hips to the floor. 6. Let your buttocks muscles relax completely after each repetition. Repeat __________ times. Complete this exercise __________ times a day. Wall slides This exercise strengthens the muscles in the buttocks and the thigh (hip extensors) and the muscles that move the knee (knee extensors). 1. Lean your back against a smooth wall or door, and walk your feet out 18-24 inches (46-61 cm) from it. 2. Place your feet hip-width apart. 3. Slowly slide down the wall or door until your knees bend __________ degrees. Keep your knees over your heels, not over your toes. Keep your knees in line with your hips. 4. Hold this position for __________ seconds. 5. Stand up to rest for __________ seconds after each repetition. Repeat __________ times. Complete this exercise __________ times a day. Single leg deadlift This exercise is also called eccentric hamstring. Eccentric exercise means that muscles in the back of the knee are lengthening under tension. 1. Stand near a railing or wall so you can hold on to it as needed during the exercise. 2. Stand on your left / right foot. Keep your left / right knee slightly bent. 3. Reach your left / right hand as far down to the floor as you can while keeping your balance. Keep your left / right knee just slightly bent. If this is easy, it can be made more challenging by holding a small weight in the reaching hand. 4. Hold this position for __________ seconds. 5.  Slowly return to standing. Repeat __________ times. Complete this exercise __________ times a day. Straight leg raises, side-lying This exercise strengthens the muscles that rotate the leg at the hip and move it away from your body (hip abductors). 1. Lie on your side with your left / right leg in the top position. Lie so your head, shoulder, hip, and knee line up. You may bend your bottom knee to help you keep your balance. 2. Lift your top  leg 4-6 inches (10-15 cm) while keeping your toes pointed straight ahead. 3. Hold this position for __________ seconds. 4. Slowly lower your leg to the starting position. 5. Allow your muscles to relax completely after each repetition. Repeat __________ times. Complete this exercise __________ times a day. This information is not intended to replace advice given to you by your health care provider. Make sure you discuss any questions you have with your health care provider. Document Revised: 10/23/2018 Document Reviewed: 07/28/2018 Elsevier Patient Education  [place].

## 2019-12-07 LAB — CBC WITH DIFFERENTIAL/PLATELET
Basophils Absolute: 0 10*3/uL (ref 0.0–0.2)
Basos: 1 %
EOS (ABSOLUTE): 0.1 10*3/uL (ref 0.0–0.4)
Eos: 2 %
Hematocrit: 40.7 % (ref 34.0–46.6)
Hemoglobin: 13.2 g/dL (ref 11.1–15.9)
Immature Grans (Abs): 0 10*3/uL (ref 0.0–0.1)
Immature Granulocytes: 0 %
Lymphocytes Absolute: 1.2 10*3/uL (ref 0.7–3.1)
Lymphs: 25 %
MCH: 27.3 pg (ref 26.6–33.0)
MCHC: 32.4 g/dL (ref 31.5–35.7)
MCV: 84 fL (ref 79–97)
Monocytes Absolute: 0.4 10*3/uL (ref 0.1–0.9)
Monocytes: 9 %
Neutrophils Absolute: 3 10*3/uL (ref 1.4–7.0)
Neutrophils: 63 %
Platelets: 294 10*3/uL (ref 150–450)
RBC: 4.83 x10E6/uL (ref 3.77–5.28)
RDW: 13.5 % (ref 11.7–15.4)
WBC: 4.7 10*3/uL (ref 3.4–10.8)

## 2019-12-07 LAB — CMP14+EGFR
ALT: 67 IU/L — ABNORMAL HIGH (ref 0–32)
AST: 55 IU/L — ABNORMAL HIGH (ref 0–40)
Albumin/Globulin Ratio: 1.9 (ref 1.2–2.2)
Albumin: 4.2 g/dL (ref 3.8–4.8)
Alkaline Phosphatase: 121 IU/L (ref 48–121)
BUN/Creatinine Ratio: 16 (ref 12–28)
BUN: 10 mg/dL (ref 8–27)
Bilirubin Total: 0.2 mg/dL (ref 0.0–1.2)
CO2: 22 mmol/L (ref 20–29)
Calcium: 9.3 mg/dL (ref 8.7–10.3)
Chloride: 106 mmol/L (ref 96–106)
Creatinine, Ser: 0.63 mg/dL (ref 0.57–1.00)
GFR calc Af Amer: 107 mL/min/{1.73_m2} (ref >59)
GFR calc non Af Amer: 93 mL/min/{1.73_m2} (ref >59)
Globulin, Total: 2.2 g/dL (ref 1.5–4.5)
Glucose: 105 mg/dL — ABNORMAL HIGH (ref 65–99)
Potassium: 4.6 mmol/L (ref 3.5–5.2)
Sodium: 140 mmol/L (ref 134–144)
Total Protein: 6.4 g/dL (ref 6.0–8.5)

## 2019-12-07 LAB — LIPID PANEL
Chol/HDL Ratio: 4.1 ratio (ref 0.0–4.4)
Cholesterol, Total: 239 mg/dL — ABNORMAL HIGH (ref 100–199)
HDL: 59 mg/dL (ref >39)
LDL Chol Calc (NIH): 159 mg/dL — ABNORMAL HIGH (ref 0–99)
Triglycerides: 119 mg/dL (ref 0–149)
VLDL Cholesterol Cal: 21 mg/dL (ref 5–40)

## 2019-12-15 ENCOUNTER — Ambulatory Visit: Payer: BC Managed Care – PPO | Attending: Family | Admitting: Physical Therapy

## 2019-12-15 ENCOUNTER — Other Ambulatory Visit: Payer: Self-pay

## 2019-12-15 ENCOUNTER — Encounter: Payer: Self-pay | Admitting: Physical Therapy

## 2019-12-15 DIAGNOSIS — M25562 Pain in left knee: Secondary | ICD-10-CM | POA: Diagnosis not present

## 2019-12-15 DIAGNOSIS — M6281 Muscle weakness (generalized): Secondary | ICD-10-CM | POA: Diagnosis not present

## 2019-12-15 DIAGNOSIS — R600 Localized edema: Secondary | ICD-10-CM | POA: Diagnosis not present

## 2019-12-15 DIAGNOSIS — R262 Difficulty in walking, not elsewhere classified: Secondary | ICD-10-CM | POA: Diagnosis not present

## 2019-12-15 DIAGNOSIS — G8929 Other chronic pain: Secondary | ICD-10-CM | POA: Insufficient documentation

## 2019-12-15 DIAGNOSIS — M25662 Stiffness of left knee, not elsewhere classified: Secondary | ICD-10-CM | POA: Diagnosis not present

## 2019-12-15 NOTE — Patient Instructions (Addendum)
Access Code: [email or web address] URL: [email or web address] Date: 12/15/2019 Prepared by: Kearney Hard  Exercises Long Sitting Quad Set with Towel Roll Under Heel - 2 x daily - 7 x weekly - 2 sets - 10 reps - 5 seconds hold Supine Knee Extension Strengthening - 2 x daily - 7 x weekly - 2 sets - 10 reps - 5 seconds hold Supine Bridge - 2 x daily - 7 x weekly - 2 sets - 10 reps - 5 seconds hold Supine Hip Adduction Isometric with Ball - 2 x daily - 7 x weekly - 2 sets - 10 reps - 5 seconds hold Straight Leg Raise - 2 x daily - 7 x weekly - 2 sets - 10 reps Hooklying Clamshell with Resistance - 2 x daily - 7 x weekly - 2 sets - 10 reps - 5 seconds hold

## 2019-12-15 NOTE — Therapy (Signed)
[facility] [place], [place], [address] Phone: [phone]   Fax:  [phone]  Physical Therapy Evaluation  Patient Details  Name: Angela Curry MRN: [id number] Date of Birth: 1950/11/23 Referring Provider (PT): [name], FNP   Encounter Date: 12/15/2019  PT End of Session - 12/15/19 1151    Visit Number  1    Number of Visits  6    Date for PT Re-Evaluation  01/28/20    Authorization Type  BCBS, Medicare A co-pay of $60 each visit    PT Start Time  0800    PT Stop Time  0845    PT Time Calculation (min)  45 min    Activity Tolerance  Patient tolerated treatment well    Behavior During Therapy  [facility] for tasks assessed/performed       Past Medical History:  Diagnosis Date  . Asthma   . Dysphagia   . Fibrocystic breast disease   . GERD (gastroesophageal reflux disease)   . H/O cold sores   . Hiatal hernia   . Osteopenia   . Psoriasis   . Vitamin D deficiency     Past Surgical History:  Procedure Laterality Date  . TONSILLECTOMY    . TUBAL LIGATION      There were no vitals filed for this visit.   Subjective Assessment - 12/15/19 0822    Subjective  Pt arriving to therpay reporting chronic L knee pain and lower extremity weakness. Pt reporting medial L knee pain which is worse with turning in bed and pain when walking. Pt reporting injection January 2020. Pt also stating that her MD believes she may have a meniscus injury but no tear since no popping was heard upon injury.    Pertinent History  osteopenia, hyperlipidemia, GERD, asthma, obesity,    Diagnostic tests  X-ray    Currently in Pain?  Yes    Pain Score  3     Pain Location  Knee    Pain Orientation  Left    Pain Descriptors / Indicators  Aching;Sore    Pain Type  Chronic pain    Pain Onset  More than a month ago    Pain Frequency  Intermittent    Aggravating Factors   turning, twisting    Pain Relieving Factors  resting    Effect  of Pain on Daily Activities  can't sleep on my back         [facility] PT Assessment - 12/15/19 0001      Assessment   Medical Diagnosis  chronic L knee pain M25.562, R 29.898 LE weakness    Referring Provider (PT)  [name], FNP    Hand Dominance  Left    Prior Therapy  no      Precautions   Precautions  None      Restrictions   Weight Bearing Restrictions  No      Balance Screen   Has the patient fallen in the past 6 months  No    Is the patient reluctant to leave their home because of a fear of falling?   No      Prior Function   Level of Independence  Independent    Vocation  Full time employment    Vocation Requirements  [employer]    Leisure  craft      Cognition   Overall Cognitive Status  Within Functional Limits for tasks assessed  Observation/Other Assessments   Focus on Therapeutic Outcomes (FOTO)   deferred due to limited visits based on $60 co-pay and finacial restriants      Observation/Other Assessments-Edema    Edema  --   pitting edema +1 noted in Lower L LE.      Posture/Postural Control   Posture/Postural Control  Postural limitations    Postural Limitations  Rounded Shoulders;Forward head;Decreased lumbar lordosis      ROM / Strength   AROM / PROM / Strength  AROM;PROM;Strength      AROM   AROM Assessment Site  Knee    Right/Left Knee  Right;Left    Right Knee Extension  3    Right Knee Flexion  122    Left Knee Extension  8   lacking 8 degrees from full knee extension   Left Knee Flexion  105      PROM   PROM Assessment Site  Knee    Right/Left Knee  Left    Left Knee Extension  5   lacking 5 degrees from full knee extension   Left Knee Flexion  110      Strength   Strength Assessment Site  Knee    Right/Left Knee  Right;Left    Right Knee Flexion  5/5    Right Knee Extension  5/5    Left Knee Flexion  4-/5    Left Knee Extension  4-/5      Palpation   Palpation comment  TTP on medial joint line of left  knee      Transfers   Five time sit to stand comments   14.6 seconds using UE support      Ambulation/Gait   Ambulation/Gait  Yes    Assistive device  None    Gait Pattern  Step-through pattern;Decreased step length - right;Decreased step length - left;Decreased stride length;Antalgic;Poor foot clearance - left                  Objective measurements completed on examination: See above findings.      [facility] PT Treatment/Exercise - 12/15/19 0001      Exercises   Exercises  Knee/Hip      Knee/Hip Exercises: Supine   Quad Sets  AROM;Strengthening;Left;5 reps    Short Arc Quad Sets  AROM;Strengthening;Left;5 reps    Bridges  Strengthening;Both;5 reps;Limitations    Bridges Limitations  holding 5 seconds with instructions for glute squeezes to improve lift off    Straight Leg Raises  Strengthening;Left;5 reps;Limitations    Straight Leg Raises Limitations  mild extensor lag, pt instructed in quad set prior to lifting    Other Supine Knee/Hip Exercises  clams using red theraband x 5             PT Education - 12/15/19 1150    Education Details  PT POC, HEP    Person(s) Educated  Patient    Methods  Explanation;Demonstration;Handout    Comprehension  Returned demonstration;Verbalized understanding;Need further instruction          PT Long Term Goals - 12/15/19 1152      PT LONG TERM GOAL #1   Title  Pt will be indpendent in her HEP and progression    Baseline  initial HEP issued on 12/15/2019    Time  6    Period  Weeks    Status  New    Target Date  01/28/20      PT LONG TERM GOAL #2  Title  pt will be able to amb community surfaces for 20 minutes with pain </= 2/10.    Baseline  pain with prolonged walking greater than 10 minutes    Time  6    Period  Weeks    Status  New    Target Date  01/28/20      PT LONG TERM GOAL #3   Title  Pt will improve her L knee flexion to >/= 120 degrees in order to improve functional mobility and gait.     Baseline  110 degrees passively    Time  6    Period  Weeks    Status  New    Target Date  01/28/20      PT LONG TERM GOAL #4   Title  pt will improve her L knee strength to >/= 4+/5 in order to improve gait and functional mobility.    Baseline  see flowsheets    Time  6    Period  Weeks    Status  New    Target Date  01/28/20             Plan - 12/15/19 1218    Clinical Impression Statement  Pt arriving to therpay reporting ongoing pain in her L knee. Pt also reporting possible injury to left meniscus but no MRI to clarify. Pt presenting with weakness in bilateral LE's. Pt also with decreased ROM in L knee compared to R. Skiled PT needed to address pt's impairments with the below interventions.    Personal Factors and Comorbidities  Comorbidity 3+    Comorbidities  asthma, GERD, obesity, osteopenia, hyperlipidemia    Examination-Activity Limitations  Sleep;Squat;Stand;Stairs    Examination-Participation Restrictions  Other;Shop;Community Activity    Stability/Clinical Decision Making  Stable/Uncomplicated    Clinical Decision Making  Low    Rehab Potential  Good    PT Frequency  1x / week   recommending 2x/week but pt with financial restraints of $60 co-pay   PT Duration  6 weeks    PT Treatment/Interventions  Taping;ADLs/Self Care Home Management;Cryotherapy;Ultrasound;Moist Heat;Iontophoresis 4mg /ml Dexamethasone;Electrical Stimulation;Gait training;Stair training;Functional mobility training;Therapeutic activities;Therapeutic exercise;Balance training;Neuromuscular re-education;Patient/family education;Manual techniques;Passive range of motion;Dry needling    PT Next Visit Plan  Nustep, LE strengthening, ROM, modalities and ice as needed.    PT Home Exercise Plan  [email or web address]    Consulted and Agree with Plan of Care  Patient       Patient will benefit from skilled therapeutic intervention in order to improve the following deficits and impairments:  Pain, Postural  dysfunction, Decreased strength, Decreased activity tolerance, Obesity, Increased edema, Decreased range of motion, Difficulty walking  Visit Diagnosis: Chronic pain of left knee  Stiffness of left knee, not elsewhere classified  Muscle weakness (generalized)  Difficulty in walking, not elsewhere classified  Localized edema     Problem List Patient Active Problem List   Diagnosis Date Noted  . Hormone replacement therapy (HRT) 07/16/2018  . Chronic pain of left knee 07/16/2018  . Folic acid deficiency [id number]  . Rosacea 07/31/2017  . Closed navicular fracture of right ankle 01/09/2016  . Obesity (BMI 30-39.9) 01/09/2016  . Diverticulitis of colon without hemorrhage 06/01/2015  . GERD (gastroesophageal reflux disease) 08/24/2014  . Chronic constipation 08/24/2014  . Palpitations 01/04/2013  . Edema 01/04/2013  . Hyperlipidemia 01/04/2013  . Osteopenia   . Fibrocystic breast disease   . Hiatal hernia   . Psoriasis 11/10/2012    Oretha Caprice , PT, MPT  12/15/2019, 12:29 PM  [facility] [place], [place], [address] Phone: [phone]   Fax:  [phone]  Name: Angela Curry MRN: [id number] Date of Birth: [birth year]

## 2019-12-28 ENCOUNTER — Ambulatory Visit: Payer: BC Managed Care – PPO | Admitting: Physical Therapy

## 2020-01-18 ENCOUNTER — Telehealth: Payer: Self-pay | Admitting: *Deleted

## 2020-01-18 NOTE — Telephone Encounter (Signed)
Prior Auth for Trulance 3mg -In Process  Key: [id number]  CoverMyMeds

## 2020-01-19 NOTE — Telephone Encounter (Signed)
Prior Auth for Trulance 3mg -APPROVED till 01/16/21  Pharmacy notified

## 2020-02-03 ENCOUNTER — Other Ambulatory Visit: Payer: Self-pay | Admitting: Family

## 2020-02-03 MED ORDER — FLUTICASONE-SALMETEROL 100-50 MCG/DOSE IN AEPB: 1.0000 | INHALATION_SPRAY | Freq: Two times a day (BID) | RESPIRATORY_TRACT | 2 refills | Status: AC

## 2020-02-16 ENCOUNTER — Other Ambulatory Visit: Payer: Self-pay | Admitting: Family

## 2020-02-16 DIAGNOSIS — K5909 Other constipation: Secondary | ICD-10-CM

## 2020-04-20 DIAGNOSIS — Z20822 Contact with and (suspected) exposure to covid-19: Secondary | ICD-10-CM | POA: Diagnosis not present

## 2020-05-01 ENCOUNTER — Other Ambulatory Visit: Payer: Self-pay | Admitting: Sports Medicine

## 2020-05-01 ENCOUNTER — Ambulatory Visit: Payer: BC Managed Care – PPO | Admitting: Sports Medicine

## 2020-05-01 ENCOUNTER — Ambulatory Visit (INDEPENDENT_AMBULATORY_CARE_PROVIDER_SITE_OTHER): Payer: BC Managed Care – PPO

## 2020-05-01 ENCOUNTER — Ambulatory Visit (INDEPENDENT_AMBULATORY_CARE_PROVIDER_SITE_OTHER): Payer: BC Managed Care – PPO | Admitting: Sports Medicine

## 2020-05-01 ENCOUNTER — Other Ambulatory Visit: Payer: Self-pay

## 2020-05-01 DIAGNOSIS — W1849XA Other slipping, tripping and stumbling without falling, initial encounter: Secondary | ICD-10-CM

## 2020-05-01 DIAGNOSIS — W19XXXA Unspecified fall, initial encounter: Secondary | ICD-10-CM

## 2020-05-01 DIAGNOSIS — M2011 Hallux valgus (acquired), right foot: Secondary | ICD-10-CM | POA: Diagnosis not present

## 2020-05-01 DIAGNOSIS — S99921A Unspecified injury of right foot, initial encounter: Secondary | ICD-10-CM

## 2020-05-01 DIAGNOSIS — M79671 Pain in right foot: Secondary | ICD-10-CM | POA: Diagnosis not present

## 2020-05-01 DIAGNOSIS — M7989 Other specified soft tissue disorders: Secondary | ICD-10-CM | POA: Diagnosis not present

## 2020-05-01 MED ORDER — TRAMADOL HCL 50 MG PO TABS: 50.0000 mg | ORAL_TABLET | Freq: Three times a day (TID) | ORAL | 0 refills | Status: DC | PRN

## 2020-05-01 NOTE — Assessment & Plan Note (Signed)
This is a pleasant 69 year old female, she tripped over an object on her deck, not really sure exactly how her foot was injured but she had immediate pain and swelling, with what appeared to be a ganglion cyst over the dorsum of the midfoot, she also has some discomfort at the ATFL and at the base of the fourth metatarsal. We obtained some x-rays, they were essentially negative, ankle is stable, foot is moderately swollen with tenderness as above. She does have a rigid soled shoe at home that she will wear for the next [redacted] weeks along with a lace up ankle brace. Adding tramadol for pain. Return to see me in 2 weeks. We did discuss the possibility of getting a CT scan to fully rule out a fracture but we will hold off for now and only obtain this if no improvement at the follow-up visit.

## 2020-05-01 NOTE — Progress Notes (Signed)
° ° °  Procedures performed today:    None.  Independent interpretation of notes and tests performed by another provider:   X-rays personally reviewed, moderate degenerative changes and soft tissue swelling without obvious fracture.  On the lateral view I do see a potential lucency through the base of the fourth metatarsal.  We will keep an eye on this.  Brief History, Exam, Impression, and Recommendations:    Right foot injury This is a pleasant 69 year old female, she tripped over an object on her deck, not really sure exactly how her foot was injured but she had immediate pain and swelling, with what appeared to be a ganglion cyst over the dorsum of the midfoot, she also has some discomfort at the ATFL and at the base of the fourth metatarsal. We obtained some x-rays, they were essentially negative, ankle is stable, foot is moderately swollen with tenderness as above. She does have a rigid soled shoe at home that she will wear for the next [redacted] weeks along with a lace up ankle brace. Adding tramadol for pain. Return to see me in 2 weeks. We did discuss the possibility of getting a CT scan to fully rule out a fracture but we will hold off for now and only obtain this if no improvement at the follow-up visit.    ___________________________________________ Gwen Her. Dianah Field, M.D., ABFM., CAQSM. Primary Care and [place] Instructor of [place] [employer] [facility]

## 2020-05-02 ENCOUNTER — Ambulatory Visit: Payer: BC Managed Care – PPO | Admitting: Family Medicine

## 2020-05-09 ENCOUNTER — Ambulatory Visit (INDEPENDENT_AMBULATORY_CARE_PROVIDER_SITE_OTHER): Payer: BC Managed Care – PPO | Admitting: *Deleted

## 2020-05-09 ENCOUNTER — Other Ambulatory Visit: Payer: Self-pay

## 2020-05-09 DIAGNOSIS — Z23 Encounter for immunization: Secondary | ICD-10-CM

## 2020-05-18 ENCOUNTER — Other Ambulatory Visit: Payer: Self-pay | Admitting: Family

## 2020-05-18 DIAGNOSIS — K5909 Other constipation: Secondary | ICD-10-CM

## 2020-05-29 ENCOUNTER — Other Ambulatory Visit: Payer: Self-pay | Admitting: Family

## 2020-05-29 DIAGNOSIS — K219 Gastro-esophageal reflux disease without esophagitis: Secondary | ICD-10-CM

## 2020-06-13 DIAGNOSIS — Z20822 Contact with and (suspected) exposure to covid-19: Secondary | ICD-10-CM | POA: Diagnosis not present

## 2020-06-14 ENCOUNTER — Ambulatory Visit (INDEPENDENT_AMBULATORY_CARE_PROVIDER_SITE_OTHER): Payer: BC Managed Care – PPO | Admitting: Family Medicine

## 2020-06-14 ENCOUNTER — Encounter: Payer: Self-pay | Admitting: Family Medicine

## 2020-06-14 DIAGNOSIS — J0110 Acute frontal sinusitis, unspecified: Secondary | ICD-10-CM

## 2020-06-14 MED ORDER — FLUTICASONE PROPIONATE 50 MCG/ACT NA SUSP
1.0000 | Freq: Two times a day (BID) | NASAL | 6 refills | Status: DC | PRN
Start: 2020-06-14 — End: 2020-11-02

## 2020-06-14 MED ORDER — AMOXICILLIN 500 MG PO CAPS
500.0000 mg | ORAL_CAPSULE | Freq: Two times a day (BID) | ORAL | 0 refills | Status: DC
Start: 2020-06-14 — End: 2020-08-04

## 2020-06-14 NOTE — Progress Notes (Signed)
Virtual Visit via telephone Note  I connected with Angela Curry on 06/14/20 at 1344 by telephone and verified that I am speaking with the correct person using two identifiers. Angela Curry is currently located at home and patient are currently with her during visit. The provider, Fransisca Kaufmann Mikaiah Stoffer, MD is located in their office at time of visit.  Call ended at 1353  I discussed the limitations, risks, security and privacy concerns of performing an evaluation and management service by telephone and the availability of in person appointments. I also discussed with the patient that there may be a patient responsible charge related to this service. The patient expressed understanding and agreed to proceed.   History and Present Illness: Patient is calling in for ear ache and pressure in head.  Yesterday worsened and is now in front headache and now is worse.  She is having pressure in temples and ears and under eyes.  When she leans forward she gets more headache. She is using ibuprofen.  She denies any fevers but does have aches and fatigue. She started 1 week ago. She denies any wheezing or trouble breathing. She was tested for covid yesterday, results pending.   1. Acute non-recurrent frontal sinusitis     Outpatient Encounter Medications as of 06/14/2020  Medication Sig  . amoxicillin (AMOXIL) 500 MG capsule Take 1 capsule (500 mg total) by mouth 2 (two) times daily.  . calcipotriene (DOVONOX) 0.005 % cream Apply topically 2 (two) times daily.  . clobetasol ointment (TEMOVATE) 0.05 % Apply topically daily.  . cyclobenzaprine (FLEXERIL) 10 MG tablet Take 1 tablet (10 mg total) by mouth 3 (three) times daily as needed for muscle spasms. (Patient not taking: Reported on 12/15/2019)  . Dexlansoprazole (DEXILANT) 30 MG capsule TAKE 1 CAPSULE(30 MG) BY MOUTH DAILY (Needs to be seen before next refill)  . diclofenac sodium (VOLTAREN) 1 % GEL Apply 2 g topically 4 (four) times daily.  (Patient not taking: Reported on 12/15/2019)  . fluticasone (FLONASE) 50 MCG/ACT nasal spray Place 1 spray into both nostrils 2 (two) times daily as needed for allergies or rhinitis.  . Fluticasone-Salmeterol (ADVAIR DISKUS) 100-50 MCG/DOSE AEPB Inhale 1 puff into the lungs in the morning and at bedtime.  . folic acid (FOLVITE) 1 MG tablet Take 1 tablet (1 mg total) by mouth daily. (Needs to be seen) (Patient not taking: Reported on 12/15/2019)  . meloxicam (MOBIC) 15 MG tablet Take 1 tablet (15 mg total) by mouth daily.  . traMADol (ULTRAM) 50 MG tablet Take 1-2 tablets (50-100 mg total) by mouth every 8 (eight) hours as needed for moderate pain. Maximum 6 tabs per day.  . TRULANCE 3 MG TABS TAKE 1 TABLET BY MOUTH DAILY  . valACYclovir (VALTREX) 1000 MG tablet Take 1 tablet (1,000 mg total) by mouth 2 (two) times daily.  . Vitamin D, Ergocalciferol, (DRISDOL) 1.25 MG (50000 UT) CAPS capsule TAKE 1 CAPSULE ONCE A WEEK (Patient not taking: Reported on 12/15/2019)  . [DISCONTINUED] fluticasone (FLONASE) 50 MCG/ACT nasal spray Place 2 sprays into both nostrils daily. (Patient not taking: Reported on 12/15/2019)   No facility-administered encounter medications on file as of 06/14/2020.    Review of Systems  Constitutional: Negative for chills and fever.  HENT: Positive for congestion, postnasal drip, rhinorrhea and sore throat. Negative for ear discharge, ear pain, sinus pressure and sneezing.   Eyes: Negative for pain, redness and visual disturbance.  Respiratory: Negative for chest tightness and shortness of breath.  Cardiovascular: Negative for chest pain and leg swelling.  Musculoskeletal: Positive for myalgias. Negative for back pain and gait problem.  Skin: Negative for rash.  Neurological: Negative for light-headedness and headaches.  Psychiatric/Behavioral: Negative for agitation and behavioral problems.  All other systems reviewed and are negative.   Observations/Objective: Patient sounds  comfortable and in no acute distress  Assessment and Plan: Problem List Items Addressed This Visit    None    Visit Diagnoses    Acute non-recurrent frontal sinusitis    -  Primary   Relevant Medications   fluticasone (FLONASE) 50 MCG/ACT nasal spray   amoxicillin (AMOXIL) 500 MG capsule       Follow up plan: Return if symptoms worsen or fail to improve.     I discussed the assessment and treatment plan with the patient. The patient was provided an opportunity to ask questions and all were answered. The patient agreed with the plan and demonstrated an understanding of the instructions.   The patient was advised to call back or seek an in-person evaluation if the symptoms worsen or if the condition fails to improve as anticipated.  The above assessment and management plan was discussed with the patient. The patient verbalized understanding of and has agreed to the management plan. Patient is aware to call the clinic if symptoms persist or worsen. Patient is aware when to return to the clinic for a follow-up visit. Patient educated on when it is appropriate to go to the emergency department.    I provided 9 minutes of non-face-to-face time during this encounter.    Worthy Rancher, MD

## 2020-06-21 ENCOUNTER — Other Ambulatory Visit: Payer: Self-pay

## 2020-06-21 ENCOUNTER — Ambulatory Visit (INDEPENDENT_AMBULATORY_CARE_PROVIDER_SITE_OTHER): Payer: BC Managed Care – PPO

## 2020-06-21 DIAGNOSIS — Z23 Encounter for immunization: Secondary | ICD-10-CM

## 2020-06-21 NOTE — Progress Notes (Signed)
   Covid-19 Vaccination Clinic  Name:  Angela Curry    MRN: [id number] DOB: [birth year]  06/21/2020  Ms. Angela Curry was observed post Covid-19 immunization for 15 minutes without incident. She was provided with Vaccine Information Sheet and instruction to access the V-Safe system.   Ms. Angela Curry was instructed to call 911 with any severe reactions post vaccine: Marland Kitchen Difficulty breathing  . Swelling of face and throat  . A fast heartbeat  . A bad rash all over body  . Dizziness and weakness   Immunizations Administered    No immunizations on file.

## 2020-07-11 DIAGNOSIS — L409 Psoriasis, unspecified: Secondary | ICD-10-CM | POA: Diagnosis not present

## 2020-07-11 DIAGNOSIS — L719 Rosacea, unspecified: Secondary | ICD-10-CM | POA: Diagnosis not present

## 2020-07-24 DIAGNOSIS — K219 Gastro-esophageal reflux disease without esophagitis: Secondary | ICD-10-CM

## 2020-07-24 MED ORDER — DEXILANT 30 MG PO CPDR: DELAYED_RELEASE_CAPSULE | ORAL | 0 refills | Status: DC

## 2020-08-04 ENCOUNTER — Encounter: Payer: Self-pay | Admitting: Family

## 2020-08-04 ENCOUNTER — Other Ambulatory Visit: Payer: Self-pay

## 2020-08-04 ENCOUNTER — Ambulatory Visit: Payer: BC Managed Care – PPO | Admitting: Family

## 2020-08-04 VITALS — BP 138/83 | HR 74 | Temp 97.2°F | Ht 62.0 in | Wt 217.6 lb

## 2020-08-04 DIAGNOSIS — E669 Obesity, unspecified: Secondary | ICD-10-CM | POA: Diagnosis not present

## 2020-08-04 DIAGNOSIS — J45909 Unspecified asthma, uncomplicated: Secondary | ICD-10-CM | POA: Insufficient documentation

## 2020-08-04 DIAGNOSIS — E785 Hyperlipidemia, unspecified: Secondary | ICD-10-CM | POA: Diagnosis not present

## 2020-08-04 DIAGNOSIS — K219 Gastro-esophageal reflux disease without esophagitis: Secondary | ICD-10-CM | POA: Diagnosis not present

## 2020-08-04 DIAGNOSIS — Z1211 Encounter for screening for malignant neoplasm of colon: Secondary | ICD-10-CM

## 2020-08-04 DIAGNOSIS — G8929 Other chronic pain: Secondary | ICD-10-CM

## 2020-08-04 DIAGNOSIS — J4520 Mild intermittent asthma, uncomplicated: Secondary | ICD-10-CM

## 2020-08-04 DIAGNOSIS — K5909 Other constipation: Secondary | ICD-10-CM | POA: Diagnosis not present

## 2020-08-04 DIAGNOSIS — B002 Herpesviral gingivostomatitis and pharyngotonsillitis: Secondary | ICD-10-CM | POA: Insufficient documentation

## 2020-08-04 DIAGNOSIS — M25562 Pain in left knee: Secondary | ICD-10-CM

## 2020-08-04 MED ORDER — DEXLANSOPRAZOLE 30 MG PO CPDR: DELAYED_RELEASE_CAPSULE | ORAL | 1 refills | Status: DC

## 2020-08-04 MED ORDER — TRULANCE 3 MG PO TABS: 1.0000 | ORAL_TABLET | Freq: Every day | ORAL | 1 refills | Status: DC

## 2020-08-04 MED ORDER — VALACYCLOVIR HCL 1 G PO TABS: 1000.0000 mg | ORAL_TABLET | Freq: Two times a day (BID) | ORAL | 1 refills | Status: DC

## 2020-08-04 NOTE — Progress Notes (Signed)
Subjective:    Patient ID: Angela Curry, female    DOB: [birth year], 70 y.o.   MRN: [id number]  Chief Complaint  Patient presents with  . Medical Management of Chronic Issues   Pt presents to the office today for chronic follow up. Pt is complaining of bilateral leg weakness that started 07/2018 and left knee pain.  Gastroesophageal Reflux She complains of belching, dysphagia, early satiety, globus sensation and heartburn. She reports no coughing or no wheezing. This is a chronic problem. The current episode started more than 1 year ago. The problem occurs occasionally. The problem has been waxing and waning. Risk factors include obesity. She has tried a PPI for the symptoms. The treatment provided mild relief.  Hyperlipidemia This is a chronic problem. The current episode started more than 1 year ago. The problem is uncontrolled. Recent lipid tests were reviewed and are high. Exacerbating diseases include obesity. Pertinent negatives include no shortness of breath. Current antihyperlipidemic treatment includes diet change. The current treatment provides no improvement of lipids. Risk factors for coronary artery disease include dyslipidemia, a sedentary lifestyle and post-menopausal.  Knee Pain  The incident occurred more than 1 week ago. The pain is present in the left knee and right knee. The pain is at a severity of 4/10. The pain is moderate.  Constipation This is a chronic problem. The current episode started more than 1 year ago. The problem has been waxing and waning since onset. She has tried diet changes (trulance) for the symptoms.  Asthma There is no cough, shortness of breath or wheezing. This is a chronic problem. The current episode started more than 1 year ago. Episode frequency: usually on in the fall. Associated symptoms include heartburn. Her past medical history is significant for asthma.  Oral herpes  Take valtrex as needed. States her last flare up was 3-4 months  ago.     Review of Systems  Respiratory: Negative for cough, shortness of breath and wheezing.   Gastrointestinal: Positive for constipation, dysphagia and heartburn.  All other systems reviewed and are negative.      Objective:   Physical Exam Vitals reviewed.  Constitutional:      General: She is not in acute distress.    Appearance: She is well-developed and well-nourished.  HENT:     Head: Normocephalic and atraumatic.     Right Ear: Tympanic membrane normal.     Left Ear: Tympanic membrane normal.     Mouth/Throat:     Mouth: Oropharynx is clear and moist.  Eyes:     Pupils: Pupils are equal, round, and reactive to light.  Neck:     Thyroid: No thyromegaly.  Cardiovascular:     Rate and Rhythm: Normal rate and regular rhythm.     Pulses: Intact distal pulses.     Heart sounds: Normal heart sounds. No murmur heard.   Pulmonary:     Effort: Pulmonary effort is normal. No respiratory distress.     Breath sounds: Normal breath sounds. No wheezing.  Abdominal:     General: Bowel sounds are normal. There is no distension.     Palpations: Abdomen is soft.     Tenderness: There is no abdominal tenderness.  Musculoskeletal:        General: No tenderness or edema. Normal range of motion.     Cervical back: Normal range of motion and neck supple.  Skin:    General: Skin is warm and dry.  Neurological:     Mental  Status: She is alert and oriented to person, place, and time.     Cranial Nerves: No cranial nerve deficit.     Deep Tendon Reflexes: Reflexes are normal and symmetric.  Psychiatric:        Mood and Affect: Mood and affect normal.        Behavior: Behavior normal.        Thought Content: Thought content normal.        Judgment: Judgment normal.       BP 138/83   Pulse 74   Temp (!) 97.2 F (36.2 C) (Temporal)   Ht 5' 2"  (1.575 m)   Wt 217 lb 9.6 oz (98.7 kg)   BMI 39.80 kg/m      Assessment & Plan:  Angela Curry comes in today with chief  complaint of Medical Management of Chronic Issues   Diagnosis and orders addressed:  1. Chronic constipation - Plecanatide (TRULANCE) 3 MG TABS; Take 1 tablet by mouth daily.  Dispense: 90 tablet; Refill: 1 - CMP14+EGFR; Future - CBC with Differential/Platelet; Future  2. Gastroesophageal reflux disease, unspecified whether esophagitis present - Dexlansoprazole (DEXILANT) 30 MG capsule; TAKE 1 CAPSULE(30 MG) BY MOUTH DAILY  Dispense: 90 capsule; Refill: 1 - Ambulatory referral to Gastroenterology - CMP14+EGFR; Future - CBC with Differential/Platelet; Future  3. Hyperlipidemia, unspecified hyperlipidemia type - CMP14+EGFR; Future - CBC with Differential/Platelet; Future - Lipid panel; Future  4. Obesity (BMI 30-39.9) - CMP14+EGFR; Future - CBC with Differential/Platelet; Future  5. Colon cancer screening - Ambulatory referral to Gastroenterology - CMP14+EGFR; Future - CBC with Differential/Platelet; Future  6. Chronic pain of left knee  7. Mild intermittent asthma without complication  8. Oral herpes   Labs pending Health Maintenance reviewed Diet and exercise encouraged  Follow up plan: 6 months    [name], FNP

## 2020-08-04 NOTE — Patient Instructions (Signed)
[employer] Current Therapy 2021 (pp. 213-216). [place], [place]: Elsevier.">  Gastroesophageal Reflux Disease, Adult Gastroesophageal reflux (GER) happens when acid from the stomach flows up into the tube that connects the mouth and the stomach (esophagus). Normally, food travels down the esophagus and stays in the stomach to be digested. However, when a person has GER, food and stomach acid sometimes move back up into the esophagus. If this becomes a more serious problem, the person may be diagnosed with a disease called gastroesophageal reflux disease (GERD). GERD occurs when the reflux:  Happens often.  Causes frequent or severe symptoms.  Causes problems such as damage to the esophagus. When stomach acid comes in contact with the esophagus, the acid may cause inflammation in the esophagus. Over time, GERD may create small holes (ulcers) in the lining of the esophagus. What are the causes? This condition is caused by a problem with the muscle between the esophagus and the stomach (lower esophageal sphincter, or LES). Normally, the LES muscle closes after food passes through the esophagus to the stomach. When the LES is weakened or abnormal, it does not close properly, and that allows food and stomach acid to go back up into the esophagus. The LES can be weakened by certain dietary substances, medicines, and medical conditions, including:  Tobacco use.  Pregnancy.  Having a hiatal hernia.  Alcohol use.  Certain foods and beverages, such as coffee, chocolate, onions, and peppermint. What increases the risk? You are more likely to develop this condition if you:  Have an increased body weight.  Have a connective tissue disorder.  Take NSAIDs, such as ibuprofen. What are the signs or symptoms? Symptoms of this condition include:  Heartburn.  Difficult or painful swallowing and the feeling of having a lump in the throat.  A bitter taste in the mouth.  Bad breath and having a large  amount of saliva.  Having an upset or bloated stomach and belching.  Chest pain. Different conditions can cause chest pain. Make sure you see your health care provider if you experience chest pain.  Shortness of breath or wheezing.  Ongoing (chronic) cough or a nighttime cough.  Wearing away of tooth enamel.  Weight loss. How is this diagnosed? This condition may be diagnosed based on a medical history and a physical exam. To determine if you have mild or severe GERD, your health care provider may also monitor how you respond to treatment. You may also have tests, including:  A test to examine your stomach and esophagus with a small camera (endoscopy).  A test that measures the acidity level in your esophagus.  A test that measures how much pressure is on your esophagus.  A barium swallow or modified barium swallow test to show the shape, size, and functioning of your esophagus. How is this treated? Treatment for this condition may vary depending on how severe your symptoms are. Your health care provider may recommend:  Changes to your diet.  Medicine.  Surgery. The goal of treatment is to help relieve your symptoms and to prevent complications. Follow these instructions at home: Eating and drinking  Follow a diet as recommended by your health care provider. This may involve avoiding foods and drinks such as: ? Coffee and tea, with or without caffeine. ? Drinks that contain alcohol. ? Energy drinks and sports drinks. ? Carbonated drinks or sodas. ? Chocolate and cocoa. ? Peppermint and mint flavorings. ? Garlic and onions. ? Horseradish. ? Spicy and acidic foods, including peppers, chili powder,   curry powder, vinegar, hot sauces, and barbecue sauce. ? Citrus fruit juices and citrus fruits, such as oranges, lemons, and limes. ? Tomato-based foods, such as red sauce, chili, salsa, and pizza with red sauce. ? Fried and fatty foods, such as donuts, french fries, potato  chips, and high-fat dressings. ? High-fat meats, such as hot dogs and fatty cuts of red and white meats, such as rib eye steak, sausage, ham, and bacon. ? High-fat dairy items, such as whole milk, butter, and cream cheese.  Eat small, frequent meals instead of large meals.  Avoid drinking large amounts of liquid with your meals.  Avoid eating meals during the 2-3 hours before bedtime.  Avoid lying down right after you eat.  Do not exercise right after you eat.   Lifestyle  Do not use any products that contain nicotine or tobacco. These products include cigarettes, chewing tobacco, and vaping devices, such as e-cigarettes. If you need help quitting, ask your health care provider.  Try to reduce your stress by using methods such as yoga or meditation. If you need help reducing stress, ask your health care provider.  If you are overweight, reduce your weight to an amount that is healthy for you. Ask your health care provider for guidance about a safe weight loss goal.   General instructions  Pay attention to any changes in your symptoms.  Take over-the-counter and prescription medicines only as told by your health care provider. Do not take aspirin, ibuprofen, or other NSAIDs unless your health care provider told you to take these medicines.  Wear loose-fitting clothing. Do not wear anything tight around your waist that causes pressure on your abdomen.  Raise (elevate) the head of your bed about 6 inches (15 cm). You can use a wedge to do this.  Avoid bending over if this makes your symptoms worse.  Keep all follow-up visits. This is important. Contact a health care provider if:  You have: ? New symptoms. ? Unexplained weight loss. ? Difficulty swallowing or it hurts to swallow. ? Wheezing or a persistent cough. ? A hoarse voice.  Your symptoms do not improve with treatment. Get help right away if:  You have sudden pain in your arms, neck, jaw, teeth, or back.  You  suddenly feel sweaty, dizzy, or light-headed.  You have chest pain or shortness of breath.  You vomit and the vomit is green, yellow, or black, or it looks like blood or coffee grounds.  You faint.  You have stool that is red, bloody, or black.  You cannot swallow, drink, or eat. These symptoms may represent a serious problem that is an emergency. Do not wait to see if the symptoms will go away. Get medical help right away. Call your local emergency services (911 in the [place] Do not drive yourself to the hospital. Summary  Gastroesophageal reflux happens when acid from the stomach flows up into the esophagus. GERD is a disease in which the reflux happens often, causes frequent or severe symptoms, or causes problems such as damage to the esophagus.  Treatment for this condition may vary depending on how severe your symptoms are. Your health care provider may recommend diet and lifestyle changes, medicine, or surgery.  Contact a health care provider if you have new or worsening symptoms.  Take over-the-counter and prescription medicines only as told by your health care provider. Do not take aspirin, ibuprofen, or other NSAIDs unless your health care provider told you to do so.  Keep all follow-up   visits as told by your health care provider. This is important. This information is not intended to replace advice given to you by your health care provider. Make sure you discuss any questions you have with your health care provider. Document Revised: 01/10/2020 Document Reviewed: 01/10/2020 Elsevier Patient Education  2021 Elsevier Inc.  

## 2020-08-08 ENCOUNTER — Encounter: Payer: Self-pay | Admitting: Physician Assistant

## 2020-08-14 ENCOUNTER — Other Ambulatory Visit: Payer: Self-pay | Admitting: Family

## 2020-08-14 DIAGNOSIS — Z1231 Encounter for screening mammogram for malignant neoplasm of breast: Secondary | ICD-10-CM

## 2020-08-17 ENCOUNTER — Other Ambulatory Visit: Payer: Self-pay | Admitting: Family

## 2020-08-17 DIAGNOSIS — K219 Gastro-esophageal reflux disease without esophagitis: Secondary | ICD-10-CM

## 2020-08-24 ENCOUNTER — Other Ambulatory Visit: Payer: Self-pay

## 2020-08-24 ENCOUNTER — Ambulatory Visit: Payer: BC Managed Care – PPO | Admitting: Physician Assistant

## 2020-08-24 ENCOUNTER — Encounter: Payer: Self-pay | Admitting: Physician Assistant

## 2020-08-24 VITALS — BP 154/92 | HR 86 | Ht 62.0 in | Wt 217.0 lb

## 2020-08-24 DIAGNOSIS — K5909 Other constipation: Secondary | ICD-10-CM

## 2020-08-24 DIAGNOSIS — Z1211 Encounter for screening for malignant neoplasm of colon: Secondary | ICD-10-CM

## 2020-08-24 DIAGNOSIS — R1310 Dysphagia, unspecified: Secondary | ICD-10-CM | POA: Diagnosis not present

## 2020-08-24 DIAGNOSIS — K219 Gastro-esophageal reflux disease without esophagitis: Secondary | ICD-10-CM | POA: Diagnosis not present

## 2020-08-24 MED ORDER — PLENVU 140 G PO SOLR: 1.0000 | ORAL | 0 refills | Status: DC

## 2020-08-24 NOTE — Patient Instructions (Signed)
If you are age 69 or older, your body mass index should be between 23-30. Your Body mass index is 39.69 kg/m. If this is out of the aforementioned range listed, please consider follow up with your Primary Care Provider.  If you are age 54 or younger, your body mass index should be between 19-25. Your Body mass index is 39.69 kg/m. If this is out of the aformentioned range listed, please consider follow up with your Primary Care Provider.   You have been scheduled for an endoscopy and colonoscopy. Please follow the written instructions given to you at your visit today. Please pick up your prep supplies at the pharmacy within the next 1-3 days. If you use inhalers (even only as needed), please bring them with you on the day of your procedure.  Continue Dexilant 30 mg 1 capsule every morning.  Continue Trulance 3 mg 1 tablet every day.  Follow up pending the results of your Endoscopy and Colonoscopy, or as needed.  Thank you for entrusting me with your care and choosing [facility].  Amy Esterwood, PA-C

## 2020-08-24 NOTE — Progress Notes (Signed)
Subjective:    Patient ID: Angela Curry, female    DOB: Jul 29, 1950, 70 y.o.   MRN: [id number]  HPI Angela Curry is a pleasant 70 year old white female, new to GI today and referred by Gelene Mink, NP for evaluation of dysphagia.  Patient is known here remotely having seen Dr. Sharlett Iles for colonoscopy in 2008 with finding of pancolonic diverticulosis, no polyps. She relates that she did have 1 very remote upper endoscopy done probably close to 20 years ago in [place] and believes she had her esophagus dilated. She has history of chronic GERD and has been on Dexilant 30 mg p.o. daily over the past several years.  She says this works well and she is generally not bothered by any heartburn or indigestion. She has been having intermittent episodes of acute dysphagia.  She says she will take a bite of food and often times with the first bite she gets a sensation that it is not going down and then has pressure buildup in her chest.  She has to either get up and walk around raise her arms etc. and says this is effective about half of the time the food will eventually go down, the other half of the time she will have to vomit.and bring the food back up.  She had a bad episode recently about a month ago with food becoming lodged at lunchtime, she was unable to get this "relieved" and when she woke up the following morning she was continuing to have symptoms.  She says she was very uncomfortable had chest pressure and was having to spit out her saliva.  She was finally able to get some Gas-X down and then eventually within the next couple of hours symptoms resolved. She says she does not have symptoms every day or with every meal but it has been happening frequently.  She says she tries to eat slowly and cut her food carefully.  She has not been avoiding any particular foods. She has no lower GI complaints today.  She is on Trulance 3 mg daily and says this works well for her chronic constipation.  She  has required some sort of medication prescription form for many years for constipation.  She is not noted any recent changes in bowel habits, no melena or hematochezia.  Review of Systems Pertinent positive and negative review of systems were noted in the above HPI section.  All other review of systems was otherwise negative.  Outpatient Encounter Medications as of 08/24/2020  Medication Sig  . clobetasol ointment (TEMOVATE) 0.05 % Apply topically daily. (Patient taking differently: Apply topically as needed.)  . Dexlansoprazole (DEXILANT) 30 MG capsule TAKE 1 CAPSULE BY MOUTH EVERY DAY  . doxycycline (VIBRA-TABS) 100 MG tablet Take 100 mg by mouth daily.  . fluocinolone (SYNALAR) 0.01 % external solution Apply topically daily as needed.  . fluticasone (FLONASE) 50 MCG/ACT nasal spray Place 1 spray into both nostrils 2 (two) times daily as needed for allergies or rhinitis.  . Fluticasone-Salmeterol (ADVAIR DISKUS) 100-50 MCG/DOSE AEPB Inhale 1 puff into the lungs in the morning and at bedtime. (Patient taking differently: Inhale 1 puff into the lungs 2 (two) times daily as needed.)  . ketoconazole (NIZORAL) 2 % shampoo every 14 (fourteen) days.  . meloxicam (MOBIC) 15 MG tablet Take 1 tablet (15 mg total) by mouth daily. (Patient taking differently: Take 15 mg by mouth daily as needed.)  . metroNIDAZOLE (METROCREAM) 0.75 % cream Apply topically daily.  Marland Kitchen PEG-KCl-NaCl-NaSulf-Na Asc-C (PLENVU) 140  g SOLR Take 1 kit by mouth as directed. Manufacturer's coupon Universal coupon code:BIN: P2366821; GROUP: [id number]; PCN: [id number]; ID: [id number]; PAY NO MORE $50; NO prior authorization  . Plecanatide (TRULANCE) 3 MG TABS Take 1 tablet by mouth daily.  . valACYclovir (VALTREX) 1000 MG tablet TAKE 1 TABLET(1000 MG) BY MOUTH TWICE DAILY (Patient taking differently: as needed.)  . calcipotriene (DOVONOX) 0.005 % cream Apply topically 2 (two) times daily. (Patient not taking: Reported on 08/24/2020)   No  facility-administered encounter medications on file as of 08/24/2020.   No Known Allergies Patient Active Problem List   Diagnosis Date Noted  . Asthma 08/04/2020  . 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 Right foot injury 05/01/2020  . Hormone replacement therapy (HRT) 07/16/2018  . Chronic pain of left knee 07/16/2018  . Folic acid deficiency 13/02/6577  . Rosacea 07/31/2017  . Closed navicular fracture of right ankle 01/09/2016  . Obesity (BMI 30-39.9) 01/09/2016  . Diverticulitis of colon without hemorrhage 06/01/2015  . GERD (gastroesophageal reflux disease) 08/24/2014  . Chronic constipation 08/24/2014  . Palpitations 01/04/2013  . Edema 01/04/2013  . Hyperlipidemia 01/04/2013  . Osteopenia   . Fibrocystic breast disease   . Hiatal hernia   . Psoriasis 11/10/2012   Social History   Socioeconomic History  . Marital status: Divorced    Spouse name: Not on file  . Number of children: Not on file  . Years of education: Not on file  . Highest education level: Not on file  Occupational History  . Not on file  Tobacco Use  . Smoking status: Never Smoker  . Smokeless tobacco: Never Used  Vaping Use  . Vaping Use: Never used  Substance and Sexual Activity  . Alcohol use: Yes  . Drug use: No  . Sexual activity: Not on file  Other Topics Concern  . Not on file  Social History Narrative  . Not on file   Social Determinants of Health   Financial Resource Strain: Not on file  Food Insecurity: Not on file  Transportation Needs: Not on file  Physical Activity: Not on file  Stress: Not on file  Social Connections: Not on file  Intimate Partner Violence: Not on file    Angela Curry's family history includes Cancer in her paternal aunt and paternal aunt; Diabetes in her mother; Drug abuse in her father; Early death in her father; Heart disease (age of onset: 15) in her father.      Objective:    Vitals:   08/24/20 0833  BP: (!) 154/92  Pulse: 86  SpO2: 97%    Physical  Exam Well-developed well-nourished older WF  in no acute distress.  Height, Weight 217 , BMI 39  HEENT; nontraumatic normocephalic, EOMI, PE R LA, sclera anicteric. Oropharynx;not done Neck; supple, no JVD Cardiovascular; regular rate and rhythm with S1-S2, no murmur rub or gallop Pulmonary; Clear bilaterally Abdomen; soft, obese ,nontender, nondistended, no palpable mass or hepatosplenomegaly, bowel sounds are active Rectal; not done  Skin; benign exam, no jaundice rash or appreciable lesions Extremities; no clubbing cyanosis or edema skin warm and dry Neuro/Psych; alert and oriented x4, grossly nonfocal mood and affect appropriate       Assessment & Plan:   #22 70 year old white female with solid food dysphagia and episodes consistent with transient food impaction.  Patient had an episode about a month ago which lasted into the following day. This is in the setting of chronic GERD on long-term PPI therapy.  Suspect peptic stricture  #  2 chronic GERD stable on Dexilant 30 mg #3.  Chronic constipation stable on Trulance 3 mg daily #4.  Diverticulosis #5.  Colon cancer screening-last colonoscopy 2008 with pancolonic diverticulosis, no polyps overdue for follow-up colonoscopy #6 asthma  Plan; Patient will be scheduled for upper endoscopy with probable esophageal dilation and colonoscopy with Dr. Loletha Carrow.  Both procedures were discussed in detail with the patient including indications risks and benefits and she is agreeable to proceed. Continue Dexilant 30 mg p.o. every morning Continue Trulance 3 mg p.o. daily. Further recommendations pending findings at endoscopy Patient has completed COVID-19 vaccination  Angela Curry Genia Harold PA-C 08/24/2020   Cc: Sharion Balloon, FNP

## 2020-08-25 ENCOUNTER — Telehealth: Payer: Self-pay | Admitting: *Deleted

## 2020-08-25 NOTE — Telephone Encounter (Signed)
PA in process for Dexilant (Key: [id number])  Your information has been submitted to [place]. [employer] [place] will review the request and notify you of the determination decision directly, typically within 72 hours of receiving all information.  You will also receive your request decision electronically. To check for an update later, open this request again from your dashboard.  If [employer] has not responded within the specified timeframe or if you have any questions about your PA submission, contact [place]  directly at [phone].

## 2020-08-28 NOTE — Telephone Encounter (Signed)
Approved on February 13 Effective from 08/25/2020 through 08/25/2023. Dexilant  [facility] aware

## 2020-08-29 ENCOUNTER — Telehealth: Payer: Self-pay | Admitting: Physician Assistant

## 2020-08-29 MED ORDER — PLENVU 140 G PO SOLR: 1.0000 | ORAL | 0 refills | Status: DC

## 2020-08-29 NOTE — Telephone Encounter (Signed)
Patient is having issues with the pharmacy is requesting we send the Plenvu prep medication to [facility] in [place] instead

## 2020-08-29 NOTE — Telephone Encounter (Signed)
Prep has been resent to requested location. If Patient has issues, we will see if we can get a sample.

## 2020-08-31 NOTE — Progress Notes (Signed)
____________________________________________________________  Attending physician addendum:  Thank you for sending this case to me. I have reviewed the entire note and agree with the plan.  We will offer her a sooner EGD if a slot opens up from cancellation.  Wilfrid Lund, MD  ____________________________________________________________

## 2020-09-12 DIAGNOSIS — L719 Rosacea, unspecified: Secondary | ICD-10-CM | POA: Diagnosis not present

## 2020-09-12 DIAGNOSIS — L820 Inflamed seborrheic keratosis: Secondary | ICD-10-CM | POA: Diagnosis not present

## 2020-09-12 DIAGNOSIS — L409 Psoriasis, unspecified: Secondary | ICD-10-CM | POA: Diagnosis not present

## 2020-10-05 ENCOUNTER — Other Ambulatory Visit: Payer: Self-pay

## 2020-10-05 ENCOUNTER — Telehealth: Payer: Self-pay

## 2020-10-05 ENCOUNTER — Ambulatory Visit (AMBULATORY_SURGERY_CENTER): Payer: BC Managed Care – PPO | Admitting: Gastroenterology

## 2020-10-05 ENCOUNTER — Encounter: Payer: Self-pay | Admitting: Gastroenterology

## 2020-10-05 VITALS — BP 102/64 | HR 77 | Temp 96.8°F | Resp 19 | Ht 62.0 in | Wt 217.0 lb

## 2020-10-05 DIAGNOSIS — K222 Esophageal obstruction: Secondary | ICD-10-CM | POA: Diagnosis not present

## 2020-10-05 DIAGNOSIS — K219 Gastro-esophageal reflux disease without esophagitis: Secondary | ICD-10-CM

## 2020-10-05 DIAGNOSIS — K5909 Other constipation: Secondary | ICD-10-CM

## 2020-10-05 DIAGNOSIS — Z1211 Encounter for screening for malignant neoplasm of colon: Secondary | ICD-10-CM

## 2020-10-05 DIAGNOSIS — R1319 Other dysphagia: Secondary | ICD-10-CM | POA: Diagnosis not present

## 2020-10-05 DIAGNOSIS — K449 Diaphragmatic hernia without obstruction or gangrene: Secondary | ICD-10-CM | POA: Diagnosis not present

## 2020-10-05 MED ORDER — SODIUM CHLORIDE 0.9 % IV SOLN: 500.0000 mL | Freq: Once | INTRAVENOUS | Status: DC

## 2020-10-05 NOTE — Telephone Encounter (Signed)
-----   Message from Doran Stabler, MD sent at 10/05/2020  4:29 PM EDT ----- Please send a referral to Dr. Greer Pickerel at [facility] for symptomatic hiatal hernia, GERD, dysphagia.

## 2020-10-05 NOTE — Telephone Encounter (Signed)
Referral, records, demographic and insurance information faxed to [facility] at [phone].

## 2020-10-05 NOTE — Patient Instructions (Signed)
YOU HAD AN ENDOSCOPIC PROCEDURE TODAY AT THE [facility]:   Refer to the procedure report that was given to you for any specific questions about what was found during the examination.  If the procedure report does not answer your questions, please call your gastroenterologist to clarify.  If you requested that your care partner not be given the details of your procedure findings, then the procedure report has been included in a sealed envelope for you to review at your convenience later.  YOU SHOULD EXPECT: Some feelings of bloating in the abdomen. Passage of more gas than usual.  Walking can help get rid of the air that was put into your GI tract during the procedure and reduce the bloating. If you had a lower endoscopy (such as a colonoscopy or flexible sigmoidoscopy) you may notice spotting of blood in your stool or on the toilet paper. If you underwent a bowel prep for your procedure, you may not have a normal bowel movement for a few days.  Please Note:  You might notice some irritation and congestion in your nose or some drainage.  This is from the oxygen used during your procedure.  There is no need for concern and it should clear up in a day or so.  SYMPTOMS TO REPORT IMMEDIATELY:   Following lower endoscopy (colonoscopy or flexible sigmoidoscopy):  Excessive amounts of blood in the stool  Significant tenderness or worsening of abdominal pains  Swelling of the abdomen that is new, acute  Fever of 100F or higher   Following upper endoscopy (EGD)  Vomiting of blood or coffee ground material  New chest pain or pain under the shoulder blades  Painful or persistently difficult swallowing  New shortness of breath  Fever of 100F or higher  Black, tarry-looking stools  For urgent or emergent issues, a gastroenterologist can be reached at any hour by calling [phone]. Do not use MyChart messaging for urgent concerns.    DIET:  We do recommend a small meal at first, but  then you may proceed to your regular diet.  Drink plenty of fluids but you should avoid alcoholic beverages for 24 hours.  ACTIVITY:  You should plan to take it easy for the rest of today and you should NOT DRIVE or use heavy machinery until tomorrow (because of the sedation medicines used during the test).    FOLLOW UP: Our staff will call the number listed on your records 48-72 hours following your procedure to check on you and address any questions or concerns that you may have regarding the information given to you following your procedure. If we do not reach you, we will leave a message.  We will attempt to reach you two times.  During this call, we will ask if you have developed any symptoms of COVID 19. If you develop any symptoms (ie: fever, flu-like symptoms, shortness of breath, cough etc.) before then, please call [phone].  If you test positive for Covid 19 in the 2 weeks post procedure, please call and report this information to us.    If any biopsies were taken you will be contacted by phone or by letter within the next 1-3 weeks.  Please call us at [phone] if you have not heard about the biopsies in 3 weeks.    SIGNATURES/CONFIDENTIALITY: You and/or your care partner have signed paperwork which will be entered into your electronic medical record.  These signatures attest to the fact that that the information above on   your After Visit Summary has been reviewed and is understood.  Full responsibility of the confidentiality of this discharge information lies with you and/or your care-partner. 

## 2020-10-05 NOTE — Op Note (Signed)
[facility] Patient Name: Angela Curry Procedure Date: 10/05/2020 3:02 PM MRN: [id number] Endoscopist: Mallie Mussel L. Loletha Carrow , MD Age: 70 Referring MD:  Date of Birth: 24-Jul-1950 Gender: Female Account #: [id number] Procedure:                Colonoscopy Indications:              Screening for colorectal malignant neoplasm (last                            colonoscopy 2008) Medicines:                Monitored Anesthesia Care Procedure:                Pre-Anesthesia Assessment:                           - Prior to the procedure, a History and Physical                            was performed, and patient medications and                            allergies were reviewed. The patient's tolerance of                            previous anesthesia was also reviewed. The risks                            and benefits of the procedure and the sedation                            options and risks were discussed with the patient.                            All questions were answered, and informed consent                            was obtained. Prior Anticoagulants: The patient has                            taken no previous anticoagulant or antiplatelet                            agents. ASA Grade Assessment: II - A patient with                            mild systemic disease. After reviewing the risks                            and benefits, the patient was deemed in                            satisfactory condition to undergo the procedure.  After obtaining informed consent, the colonoscope                            was passed under direct vision. Throughout the                            procedure, the patient's blood pressure, pulse, and                            oxygen saturations were monitored continuously. The                            Olympus CF-HQ190L (Serial# [id number]) Colonoscope was                            introduced through the anus and  advanced to the the                            cecum, identified by appendiceal orifice and                            ileocecal valve. The colonoscopy was somewhat                            difficult due to a redundant colon and a tortuous                            colon. The patient tolerated the procedure well.                            The quality of the bowel preparation was excellent.                            The ileocecal valve, appendiceal orifice, and                            rectum were photographed. Scope In: 3:08:11 PM Scope Out: 3:20:49 PM Scope Withdrawal Time: 0 hours 9 minutes 22 seconds  Total Procedure Duration: 0 hours 12 minutes 38 seconds  Findings:                 The perianal and digital rectal examinations were                            normal.                           Many diverticula were found in the entire colon.                           The exam was otherwise without abnormality on                            direct and retroflexion views. Complications:  No immediate complications. Estimated Blood Loss:     Estimated blood loss: none. Impression:               - Diverticulosis in the entire examined colon.                           - The examination was otherwise normal on direct                            and retroflexion views.                           - No specimens collected. Recommendation:           - Patient has a contact number available for                            emergencies. The signs and symptoms of potential                            delayed complications were discussed with the                            patient. Return to normal activities tomorrow.                            Written discharge instructions were provided to the                            patient.                           - Resume previous diet.                           - Continue present medications.                           - No repeat screening  colonoscopy recommended due                            to current age (25 years or older) and the absence                            of colonic polyps.                           - See the other procedure note for documentation of                            additional recommendations. Henry L. Loletha Carrow, MD 10/05/2020 3:38:36 PM This report has been signed electronically.

## 2020-10-05 NOTE — Op Note (Signed)
Angela Curry: Angela Curry Procedure Date: 10/05/2020 3:01 PM MRN: [id number] Endoscopist: Mallie Mussel Angela Curry , MD Age: 70 Referring MD:  Date of Birth: Oct 04, 1950 Gender: Female Account #: [id number] Procedure:                Upper GI endoscopy Indications:              Esophageal dysphagia, Esophageal reflux symptoms                            that recur despite appropriate therapy Medicines:                Monitored Anesthesia Care Procedure:                Pre-Anesthesia Assessment:                           - Prior to the procedure, a History and Physical                            was performed, and patient medications and                            allergies were reviewed. The patient's tolerance of                            previous anesthesia was also reviewed. The risks                            and benefits of the procedure and the sedation                            options and risks were discussed with the patient.                            All questions were answered, and informed consent                            was obtained. Prior Anticoagulants: The patient has                            taken no previous anticoagulant or antiplatelet                            agents. ASA Grade Assessment: II - A patient with                            mild systemic disease. After reviewing the risks                            and benefits, the patient was deemed in                            satisfactory condition to undergo the procedure.  After obtaining informed consent, the endoscope was                            passed under direct vision. Throughout the                            procedure, the patient's blood pressure, pulse, and                            oxygen saturations were monitored continuously. The                            Endoscope was introduced through the mouth, and                            advanced to  the second part of duodenum. The upper                            GI endoscopy was accomplished without difficulty.                            The patient tolerated the procedure well. Scope In: Scope Out: Findings:                 An 8 cm hiatal hernia was present.                           The lower third of the esophagus was tortuous (due                            to the hiatal hernia).                           One benign-appearing, intrinsic moderate stenosis                            was found at the gastroesophageal junction. This                            stenosis measured 1.2 cm (inner diameter) x less                            than one cm (in length). The stenosis was                            traversed. A TTS dilator was passed through the                            scope. Dilation with a 16-17-18 mm balloon dilator                            was performed to 18 mm. The dilation site was  examined and showed moderate mucosal disruption and                            moderate improvement in luminal narrowing.                           Multiple sessile fundic gland polyps were found in                            the gastric fundus and in the gastric body.                           The cardia and gastric fundus were normal on                            retroflexion.                           A few medium submucosal nodules were found in the                            second portion of the duodenum. (one appeared                            lipomatous) Complications:            No immediate complications. Estimated Blood Loss:     Estimated blood loss was minimal. Impression:               - 8 cm hiatal hernia.                           - Tortuous esophagus.                           - Benign-appearing esophageal stenosis. Dilated.                           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                           - No specimens collected. Recommendation:           - Patient has a contact number available for                            emergencies. The signs and symptoms of potential                            delayed complications were discussed with the                            patient. Return to normal activities tomorrow.  Written discharge instructions were provided to the                            patient.                           - Resume previous diet.                           - Continue present medications.                           - Refer to a surgeon to discuss hiatal hernia                            repair/fundoplication.                           - Case will be discussed with a colleague re:                            possible EUS of submucosal duodenal nodules. [name] L. Loletha Carrow, MD 10/05/2020 3:45:25 PM This report has been signed electronically.

## 2020-10-05 NOTE — Progress Notes (Signed)
pt tolerated well. VSS. awake and to recovery. Report given to RN. Bite block left insitu to recovery. 

## 2020-10-05 NOTE — Progress Notes (Signed)
Called to room to assist during endoscopic procedure.  Patient ID and intended procedure confirmed with present staff. Received instructions for my participation in the procedure from the performing physician.  

## 2020-10-05 NOTE — Progress Notes (Signed)
[facility] 

## 2020-10-09 ENCOUNTER — Telehealth: Payer: Self-pay

## 2020-10-09 NOTE — Telephone Encounter (Signed)
  Follow up Call-  Call back number 10/05/2020  Post procedure Call Back phone  # [phone]  Permission to leave phone message Yes  Some recent data might be hidden     Patient questions:  Do you have a fever, pain , or abdominal swelling? No. Pain Score  0 *  Have you tolerated food without any problems? Yes.    Have you been able to return to your normal activities? Yes.    Do you have any questions about your discharge instructions: Diet   No. Medications  No. Follow up visit  No.  Do you have questions or concerns about your Care? No.  Actions: * If pain score is 4 or above: No action needed, pain <4.  1. Have you developed a fever since your procedure? no  2.   Have you had an respiratory symptoms (SOB or cough) since your procedure? no  3.   Have you tested positive for COVID 19 since your procedure no  4.   Have you had any family members/close contacts diagnosed with the COVID 19 since your procedure?  no   If yes to any of these questions please route to Joylene John, RN and Joella Prince, RN

## 2020-10-11 ENCOUNTER — Ambulatory Visit
Admission: RE | Admit: 2020-10-11 | Discharge: 2020-10-11 | Disposition: A | Discharge status: Home / Self Care | Payer: BC Managed Care – PPO | Source: Ambulatory Visit | Attending: Family | Admitting: Family

## 2020-10-11 ENCOUNTER — Other Ambulatory Visit: Payer: Self-pay

## 2020-10-11 DIAGNOSIS — Z1231 Encounter for screening mammogram for malignant neoplasm of breast: Secondary | ICD-10-CM | POA: Diagnosis not present

## 2020-11-02 ENCOUNTER — Other Ambulatory Visit: Payer: Self-pay

## 2020-11-02 ENCOUNTER — Encounter: Payer: Self-pay | Admitting: Family

## 2020-11-02 ENCOUNTER — Ambulatory Visit: Payer: BC Managed Care – PPO | Admitting: Family

## 2020-11-02 VITALS — BP 148/85 | HR 73 | Temp 97.0°F | Ht 62.0 in | Wt 222.2 lb

## 2020-11-02 DIAGNOSIS — H938X2 Other specified disorders of left ear: Secondary | ICD-10-CM | POA: Diagnosis not present

## 2020-11-02 DIAGNOSIS — J301 Allergic rhinitis due to pollen: Secondary | ICD-10-CM | POA: Diagnosis not present

## 2020-11-02 DIAGNOSIS — R030 Elevated blood-pressure reading, without diagnosis of hypertension: Secondary | ICD-10-CM | POA: Diagnosis not present

## 2020-11-02 MED ORDER — CETIRIZINE HCL 10 MG PO TABS: 10.0000 mg | ORAL_TABLET | Freq: Every day | ORAL | 11 refills | Status: DC

## 2020-11-02 MED ORDER — FLUTICASONE PROPIONATE 50 MCG/ACT NA SUSP: 2.0000 | Freq: Every day | NASAL | 6 refills | Status: AC

## 2020-11-02 NOTE — Patient Instructions (Signed)
Allergic Rhinitis, Adult  Allergic rhinitis is an allergic reaction that affects the mucous membrane inside the nose. The mucous membrane is the tissue that produces mucus. There are two types of allergic rhinitis:  Seasonal. This type is also called hay fever and happens only during certain seasons.  Perennial. This type can happen at any time of the year. Allergic rhinitis cannot be spread from person to person. This condition can be mild, moderate, or severe. It can develop at any age and may be outgrown. What are the causes? This condition is caused by allergens. These are things that can cause an allergic reaction. Allergens may differ for seasonal allergic rhinitis and perennial allergic rhinitis.  Seasonal allergic rhinitis is triggered by pollen. Pollen can come from grasses, trees, and weeds.  Perennial allergic rhinitis may be triggered by: ? Dust mites. ? Proteins in a pet's urine, saliva, or dander. Dander is dead skin cells from a pet. ? Smoke, mold, or car fumes. What increases the risk? You are more likely to develop this condition if you have a family history of allergies or other conditions related to allergies, including:  Allergic conjunctivitis. This is inflammation of parts of the eyes and eyelids.  Asthma. This condition affects the lungs and makes it hard to breathe.  Atopic dermatitis or eczema. This is long term (chronic) inflammation of the skin.  Food allergies. What are the signs or symptoms? Symptoms of this condition include:  Sneezing or coughing.  A stuffy nose (nasal congestion), itchy nose, or nasal discharge.  Itchy eyes and tearing of the eyes.  A feeling of mucus dripping down the back of your throat (postnasal drip).  Trouble sleeping.  Tiredness or fatigue.  Headache.  Sore throat. How is this diagnosed? This condition may be diagnosed with your symptoms, medical history, and physical exam. Your health care provider may check for  related conditions, such as:  Asthma.  Pink eye. This is eye inflammation caused by infection (conjunctivitis).  Ear infection.  Upper respiratory infection. This is an infection in the nose, throat, or upper airways. You may also have tests to find out which allergens trigger your symptoms. These may include skin tests or blood tests. How is this treated? There is no cure for this condition, but treatment can help control symptoms. Treatment may include:  Taking medicines that block allergy symptoms, such as corticosteroids and antihistamines. Medicine may be given as a shot, nasal spray, or pill.  Avoiding any allergens.  Being exposed again and again to tiny amounts of allergens to help you build a defense against allergens (immunotherapy). This is done if other treatments have not helped. It may include: ? Allergy shots. These are injected medicines that have small amounts of allergen in them. ? Sublingual immunotherapy. This involves taking small doses of a medicine with allergen in it under your tongue. If these treatments do not work, your health care provider may prescribe newer, stronger medicines. Follow these instructions at home: Avoiding allergens Find out what you are allergic to and avoid those allergens. These are some things you can do to help avoid allergens:  If you have perennial allergies: ? Replace carpet with wood, tile, or vinyl flooring. Carpet can trap dander and dust. ? Do not smoke. Do not allow smoking in your home. ? Change your heating and air conditioning filters at least once a month.  If you have seasonal allergies, take these steps during allergy season: ? Keep windows closed as much as possible. ?   Plan outdoor activities when pollen counts are lowest. Check pollen counts before you plan outdoor activities. ? When coming indoors, change clothing and shower before sitting on furniture or bedding.  If you have a pet in the house that produces  allergens: ? Keep the pet out of the bedroom. ? Vacuum, sweep, and dust regularly. General instructions  Take over-the-counter and prescription medicines only as told by your health care provider.  Drink enough fluid to keep your urine pale yellow.  Keep all follow-up visits as told by your health care provider. This is important. Where to find more information  American Academy of Allergy, Asthma & Immunology: www.aaaai.org Contact a health care provider if:  You have a fever.  You develop a cough that does not go away.  You make whistling sounds when you breathe (wheeze).  Your symptoms slow you down or stop you from doing your normal activities each day. Get help right away if:  You have shortness of breath. This symptom may represent a serious problem that is an emergency. Do not wait to see if the symptom will go away. Get medical help right away. Call your local emergency services (911 in the [place] Do not drive yourself to the hospital. Summary  Allergic rhinitis may be managed by taking medicines as directed and avoiding allergens.  If you have seasonal allergies, keep windows closed as much as possible during allergy season.  Contact your health care provider if you develop a fever or a cough that does not go away. This information is not intended to replace advice given to you by your health care provider. Make sure you discuss any questions you have with your health care provider. Document Revised: 08/20/2019 Document Reviewed: 06/29/2019 Elsevier Patient Education  2021 Elsevier Inc.  

## 2020-11-02 NOTE — Progress Notes (Signed)
Subjective:    Patient ID: Angela Curry, female    DOB: [birth year], 70 y.o.   MRN: [id number]  Chief Complaint  Patient presents with  . ear pressure    2/3 weeks. Doesn't hurt     Ear Fullness  There is pain in the left ear. This is a new problem. The current episode started 1 to 4 weeks ago. The problem occurs constantly. The problem has been waxing and waning. The pain is at a severity of 2/10 (pressure). The pain is mild. Associated symptoms include headaches and hearing loss. Pertinent negatives include no coughing, ear discharge, rhinorrhea or sore throat. She has tried acetaminophen and NSAIDs for the symptoms. The treatment provided mild relief.      Review of Systems  HENT: Positive for hearing loss. Negative for ear discharge, rhinorrhea and sore throat.   Respiratory: Negative for cough.   Neurological: Positive for headaches.  All other systems reviewed and are negative.      Objective:   Physical Exam Vitals reviewed.  Constitutional:      General: She is not in acute distress.    Appearance: She is well-developed.  HENT:     Head: Normocephalic and atraumatic.     Right Ear: Tympanic membrane normal.     Left Ear: Tympanic membrane normal.     Nose:     Right Turbinates: Enlarged and swollen.     Left Turbinates: Enlarged and swollen.     Mouth/Throat:     Pharynx: Posterior oropharyngeal erythema present.  Eyes:     Pupils: Pupils are equal, round, and reactive to light.  Neck:     Thyroid: No thyromegaly.  Cardiovascular:     Rate and Rhythm: Normal rate and regular rhythm.     Heart sounds: Normal heart sounds. No murmur heard.   Pulmonary:     Effort: Pulmonary effort is normal. No respiratory distress.     Breath sounds: Normal breath sounds. No wheezing.  Abdominal:     General: Bowel sounds are normal. There is no distension.     Palpations: Abdomen is soft.     Tenderness: There is no abdominal tenderness.  Musculoskeletal:         General: No tenderness. Normal range of motion.     Cervical back: Normal range of motion and neck supple.  Skin:    General: Skin is warm and dry.  Neurological:     Mental Status: She is alert and oriented to person, place, and time.     Cranial Nerves: No cranial nerve deficit.     Deep Tendon Reflexes: Reflexes are normal and symmetric.  Psychiatric:        Behavior: Behavior normal.        Thought Content: Thought content normal.        Judgment: Judgment normal.       BP (!) 169/80   Pulse 73   Temp (!) 97 F (36.1 C)   Ht 5\' 2"  (1.575 m)   Wt 222 lb 3.2 oz (100.8 kg)   SpO2 98%   BMI 40.64 kg/m      Assessment & Plan:  Angela Curry comes in today with chief complaint of ear pressure (2/3 weeks. Doesn't hurt/)   Diagnosis and orders addressed:  1. Allergic rhinitis due to pollen, unspecified seasonality - cetirizine (ZYRTEC) 10 MG tablet; Take 1 tablet (10 mg total) by mouth daily.  Dispense: 30 tablet; Refill: 11 - fluticasone (FLONASE) 50 MCG/ACT  nasal spray; Place 2 sprays into both nostrils daily.  Dispense: 16 g; Refill: 6  2. Sensation of fullness in left ear  3. Elevated blood pressure reading If BP continues to be elevated will need to start medications.    Start Zyrtec 10 mg and Flonase  Tylenol as needed If not improvement call and I will send in antibiotics  RTO as needed.    [name], FNP

## 2020-11-08 DIAGNOSIS — K219 Gastro-esophageal reflux disease without esophagitis: Secondary | ICD-10-CM | POA: Diagnosis not present

## 2020-11-08 DIAGNOSIS — K449 Diaphragmatic hernia without obstruction or gangrene: Secondary | ICD-10-CM | POA: Diagnosis not present

## 2020-12-08 ENCOUNTER — Other Ambulatory Visit: Payer: Self-pay | Admitting: Family

## 2020-12-08 NOTE — Telephone Encounter (Signed)
Last office visit 11/02/20 Last refill 12/06/19, 30 grams, 2 refills

## 2020-12-12 ENCOUNTER — Ambulatory Visit: Payer: BC Managed Care – PPO | Admitting: Family

## 2020-12-14 ENCOUNTER — Encounter: Payer: Self-pay | Admitting: Family

## 2020-12-14 ENCOUNTER — Ambulatory Visit: Payer: BC Managed Care – PPO | Admitting: Family

## 2020-12-14 ENCOUNTER — Other Ambulatory Visit: Payer: Self-pay

## 2020-12-14 VITALS — BP 148/83 | HR 7 | Temp 97.8°F | Ht 62.0 in | Wt 209.4 lb

## 2020-12-14 DIAGNOSIS — E669 Obesity, unspecified: Secondary | ICD-10-CM

## 2020-12-14 DIAGNOSIS — K5909 Other constipation: Secondary | ICD-10-CM | POA: Diagnosis not present

## 2020-12-14 DIAGNOSIS — E785 Hyperlipidemia, unspecified: Secondary | ICD-10-CM | POA: Diagnosis not present

## 2020-12-14 DIAGNOSIS — K449 Diaphragmatic hernia without obstruction or gangrene: Secondary | ICD-10-CM

## 2020-12-14 DIAGNOSIS — Z1211 Encounter for screening for malignant neoplasm of colon: Secondary | ICD-10-CM

## 2020-12-14 DIAGNOSIS — Z713 Dietary counseling and surveillance: Secondary | ICD-10-CM

## 2020-12-14 DIAGNOSIS — K219 Gastro-esophageal reflux disease without esophagitis: Secondary | ICD-10-CM | POA: Diagnosis not present

## 2020-12-14 MED ORDER — OZEMPIC (0.25 OR 0.5 MG/DOSE) 2 MG/1.5ML ~~LOC~~ SOPN: PEN_INJECTOR | SUBCUTANEOUS | 2 refills | Status: AC

## 2020-12-14 MED ORDER — PHENTERMINE HCL 37.5 MG PO CAPS: 37.5000 mg | ORAL_CAPSULE | Freq: Every morning | ORAL | 2 refills | Status: DC

## 2020-12-14 NOTE — Progress Notes (Signed)
Subjective:    Patient ID: Angela Curry, female    DOB: [birth year], 70 y.o.   MRN: [id number]  Chief Complaint  Patient presents with  . Weight Loss Surgery    Needs 40lbs discuss wt loss meds     HPI PT presents to the office today for weight loss. She states she needs hiatal hernia repair but needs to lose 40 lbs. She is currently taking phentermine and has lost 15 lbs since starting.   She reports she has decreased her intake, but has not started her exercising.    Review of Systems  All other systems reviewed and are negative.      Objective:   Physical Exam Vitals reviewed.  Constitutional:      General: She is not in acute distress.    Appearance: She is well-developed. She is obese.  HENT:     Head: Normocephalic and atraumatic.     Right Ear: Tympanic membrane normal.     Left Ear: Tympanic membrane normal.  Eyes:     Pupils: Pupils are equal, round, and reactive to light.  Neck:     Thyroid: No thyromegaly.  Cardiovascular:     Rate and Rhythm: Normal rate and regular rhythm.     Heart sounds: Normal heart sounds. No murmur heard.   Pulmonary:     Effort: Pulmonary effort is normal. No respiratory distress.     Breath sounds: Normal breath sounds. No wheezing.  Abdominal:     General: Bowel sounds are normal. There is no distension.     Palpations: Abdomen is soft.     Tenderness: There is no abdominal tenderness.  Musculoskeletal:        General: No tenderness. Normal range of motion.     Cervical back: Normal range of motion and neck supple.  Skin:    General: Skin is warm and dry.  Neurological:     Mental Status: She is alert and oriented to person, place, and time.     Cranial Nerves: No cranial nerve deficit.     Deep Tendon Reflexes: Reflexes are normal and symmetric.  Psychiatric:        Behavior: Behavior normal.        Thought Content: Thought content normal.        Judgment: Judgment normal.       BP (!) 148/83   Pulse  (!) 7   Temp 97.8 F (36.6 C) (Temporal)   Ht 5\' 2"  (1.575 m)   Wt 209 lb 6.4 oz (95 kg)   BMI 38.30 kg/m      Assessment & Plan:  Angela Curry comes in today with chief complaint of Weight Loss Surgery (Needs 40lbs discuss wt loss meds )   Diagnosis and orders addressed:  1. Weight loss counseling, encounter for  - Semaglutide,0.25 or 0.5MG /DOS, (OZEMPIC, 0.25 OR 0.5 MG/DOSE,) 2 MG/1.5ML SOPN; Inject 0.25 mg into the skin once a week for 28 days, THEN 0.5 mg once a week for 28 days.  Dispense: 9 mL; Refill: 2 - phentermine 37.5 MG capsule; Take 1 capsule (37.5 mg total) by mouth every morning.  Dispense: 30 capsule; Refill: 2  2. Morbid obesity ([place]) - Semaglutide,0.25 or 0.5MG /DOS, (OZEMPIC, 0.25 OR 0.5 MG/DOSE,) 2 MG/1.5ML SOPN; Inject 0.25 mg into the skin once a week for 28 days, THEN 0.5 mg once a week for 28 days.  Dispense: 9 mL; Refill: 2 - phentermine 37.5 MG capsule; Take 1 capsule (37.5 mg total)  by mouth every morning.  Dispense: 30 capsule; Refill: 2  3. Hiatal hernia - Semaglutide,0.25 or 0.5MG /DOS, (OZEMPIC, 0.25 OR 0.5 MG/DOSE,) 2 MG/1.5ML SOPN; Inject 0.25 mg into the skin once a week for 28 days, THEN 0.5 mg once a week for 28 days.  Dispense: 9 mL; Refill: 2 - phentermine 37.5 MG capsule; Take 1 capsule (37.5 mg total) by mouth every morning.  Dispense: 30 capsule; Refill: 2  Must lose 5 % of weight to continue medicaiton Labs pending Health Maintenance reviewed Diet and exercise encouraged  Follow up plan: 3 months    [name], FNP

## 2020-12-14 NOTE — Patient Instructions (Signed)
Exercising to Lose Weight Exercise is structured, repetitive physical activity to improve fitness and health. Getting regular exercise is important for everyone. It is especially important if you are overweight. Being overweight increases your risk of heart disease, stroke, diabetes, high blood pressure, and several types of cancer. Reducing your calorie intake and exercising can help you lose weight. Exercise is usually categorized as moderate or vigorous intensity. To lose weight, most people need to do a certain amount of moderate-intensity or vigorous-intensity exercise each week. Moderate-intensity exercise Moderate-intensity exercise is any activity that gets you moving enough to burn at least three times more energy (calories) than if you were sitting. Examples of moderate exercise include:  Walking a mile in 15 minutes.  Doing light yard work.  Biking at an easy pace. Most people should get at least 150 minutes (2 hours and 30 minutes) a week of moderate-intensity exercise to maintain their body weight.   Vigorous-intensity exercise Vigorous-intensity exercise is any activity that gets you moving enough to burn at least six times more calories than if you were sitting. When you exercise at this intensity, you should be working hard enough that you are not able to carry on a conversation. Examples of vigorous exercise include:  Running.  Playing a team sport, such as football, basketball, and soccer.  Jumping rope. Most people should get at least 75 minutes (1 hour and 15 minutes) a week of vigorous-intensity exercise to maintain their body weight. How can exercise affect me? When you exercise enough to burn more calories than you eat, you lose weight. Exercise also reduces body fat and builds muscle. The more muscle you have, the more calories you burn. Exercise also:  Improves mood.  Reduces stress and tension.  Improves your overall fitness, flexibility, and  endurance.  Increases bone strength. The amount of exercise you need to lose weight depends on:  Your age.  The type of exercise.  Any health conditions you have.  Your overall physical ability. Talk to your health care provider about how much exercise you need and what types of activities are safe for you. What actions can I take to lose weight? Nutrition  Make changes to your diet as told by your health care provider or diet and nutrition specialist (dietitian). This may include: ? Eating fewer calories. ? Eating more protein. ? Eating less unhealthy fats. ? Eating a diet that includes fresh fruits and vegetables, whole grains, low-fat dairy products, and lean protein. ? Avoiding foods with added fat, salt, and sugar.  Drink plenty of water while you exercise to prevent dehydration or heat stroke.   Activity  Choose an activity that you enjoy and set realistic goals. Your health care provider can help you make an exercise plan that works for you.  Exercise at a moderate or vigorous intensity most days of the week. ? The intensity of exercise may vary from person to person. You can tell how intense a workout is for you by paying attention to your breathing and heartbeat. Most people will notice their breathing and heartbeat get faster with more intense exercise.  Do resistance training twice each week, such as: ? Push-ups. ? Sit-ups. ? Lifting weights. ? Using resistance bands.  Getting short amounts of exercise can be just as helpful as long structured periods of exercise. If you have trouble finding time to exercise, try to include exercise in your daily routine. ? Get up, stretch, and walk around every 30 minutes throughout the day. ? Go   for a walk during your lunch break. ? Park your car farther away from your destination. ? If you take public transportation, get off one stop early and walk the rest of the way. ? Make phone calls while standing up and walking  around. ? Take the stairs instead of elevators or escalators.  Wear comfortable clothes and shoes with good support.  Do not exercise so much that you hurt yourself, feel dizzy, or get very short of breath. Where to find more information  U.S[place] Department of Health and Human Services: [employer]  Centers for Disease Control and Prevention (CDC): http://www.wolf.info/ Contact a health care provider:  Before starting a new exercise program.  If you have questions or concerns about your weight.  If you have a medical problem that keeps you from exercising. Get help right away if you have any of the following while exercising:  Injury.  Dizziness.  Difficulty breathing or shortness of breath that does not go away when you stop exercising.  Chest pain.  Rapid heartbeat. Summary  Being overweight increases your risk of heart disease, stroke, diabetes, high blood pressure, and several types of cancer.  Losing weight happens when you burn more calories than you eat.  Reducing the amount of calories you eat in addition to getting regular moderate or vigorous exercise each week helps you lose weight. This information is not intended to replace advice given to you by your health care provider. Make sure you discuss any questions you have with your health care provider. Document Revised: 10/28/2019 Document Reviewed: 10/28/2019 Elsevier Patient Education  2021 [place] [employer] Calories are units of energy. Your body needs a certain number of calories from food to keep going throughout the day. When you eat or drink more calories than your body needs, your body stores the extra calories mostly as fat. When you eat or drink fewer calories than your body needs, your body burns fat to get the energy it needs. Calorie counting means keeping track of how many calories you eat and drink each day. Calorie counting can be helpful if you need to lose weight. If you eat fewer  calories than your body needs, you should lose weight. Ask your health care provider what a healthy weight is for you. For calorie counting to work, you will need to eat the right number of calories each day to lose a healthy amount of weight per week. A dietitian can help you figure out how many calories you need in a day and will suggest ways to reach your calorie goal.  A healthy amount of weight to lose each week is usually 1-2 lb (0.5-0.9 kg). This usually means that your daily calorie intake should be reduced by 500-750 calories.  Eating 1,200-1,500 calories a day can help most women lose weight.  Eating 1,500-1,800 calories a day can help most men lose weight. What do I need to know about calorie counting? Work with your health care provider or dietitian to determine how many calories you should get each day. To meet your daily calorie goal, you will need to:  Find out how many calories are in each food that you would like to eat. Try to do this before you eat.  Decide how much of the food you plan to eat.  Keep a food log. Do this by writing down what you ate and how many calories it had. To successfully lose weight, it is important to balance calorie counting with a  healthy lifestyle that includes regular activity. Where do I find calorie information? The number of calories in a food can be found on a Nutrition Facts label. If a food does not have a Nutrition Facts label, try to look up the calories online or ask your dietitian for help. Remember that calories are listed per serving. If you choose to have more than one serving of a food, you will have to multiply the calories per serving by the number of servings you plan to eat. For example, the label on a package of bread might say that a serving size is 1 slice and that there are 90 calories in a serving. If you eat 1 slice, you will have eaten 90 calories. If you eat 2 slices, you will have eaten 180 calories.   How do I keep a food  log? After each time that you eat, record the following in your food log as soon as possible:  What you ate. Be sure to include toppings, sauces, and other extras on the food.  How much you ate. This can be measured in cups, ounces, or number of items.  How many calories were in each food and drink.  The total number of calories in the food you ate. Keep your food log near you, such as in a pocket-sized notebook or on an app or website on your mobile phone. Some programs will calculate calories for you and show you how many calories you have left to meet your daily goal. What are some portion-control tips?  Know how many calories are in a serving. This will help you know how many servings you can have of a certain food.  Use a measuring cup to measure serving sizes. You could also try weighing out portions on a kitchen scale. With time, you will be able to estimate serving sizes for some foods.  Take time to put servings of different foods on your favorite plates or in your favorite bowls and cups so you know what a serving looks like.  Try not to eat straight from a food's packaging, such as from a bag or box. Eating straight from the package makes it hard to see how much you are eating and can lead to overeating. Put the amount you would like to eat in a cup or on a plate to make sure you are eating the right portion.  Use smaller plates, glasses, and bowls for smaller portions and to prevent overeating.  Try not to multitask. For example, avoid watching TV or using your computer while eating. If it is time to eat, sit down at a table and enjoy your food. This will help you recognize when you are full. It will also help you be more mindful of what and how much you are eating. What are tips for following this plan? Reading food labels  Check the calorie count compared with the serving size. The serving size may be smaller than what you are used to eating.  Check the source of the  calories. Try to choose foods that are high in protein, fiber, and vitamins, and low in saturated fat, trans fat, and sodium. Shopping  Read nutrition labels while you shop. This will help you make healthy decisions about which foods to buy.  Pay attention to nutrition labels for low-fat or fat-free foods. These foods sometimes have the same number of calories or more calories than the full-fat versions. They also often have added sugar, starch, or salt to make  up for flavor that was removed with the fat.  Make a grocery list of lower-calorie foods and stick to it. Cooking  Try to cook your favorite foods in a healthier way. For example, try baking instead of frying.  Use low-fat dairy products. Meal planning  Use more fruits and vegetables. One-half of your plate should be fruits and vegetables.  Include lean proteins, such as chicken, [place], and fish. Lifestyle Each week, aim to do one of the following:  150 minutes of moderate exercise, such as walking.  75 minutes of vigorous exercise, such as running. General information  Know how many calories are in the foods you eat most often. This will help you calculate calorie counts faster.  Find a way of tracking calories that works for you. Get creative. Try different apps or programs if writing down calories does not work for you. What foods should I eat?  Eat nutritious foods. It is better to have a nutritious, high-calorie food, such as an avocado, than a food with few nutrients, such as a bag of potato chips.  Use your calories on foods and drinks that will fill you up and will not leave you hungry soon after eating. ? Examples of foods that fill you up are nuts and nut butters, vegetables, lean proteins, and high-fiber foods such as whole grains. High-fiber foods are foods with more than 5 g of fiber per serving.  Pay attention to calories in drinks. Low-calorie drinks include water and unsweetened drinks. The items listed  above may not be a complete list of foods and beverages you can eat. Contact a dietitian for more information.   What foods should I limit? Limit foods or drinks that are not good sources of vitamins, minerals, or protein or that are high in unhealthy fats. These include:  Candy.  Other sweets.  Sodas, specialty coffee drinks, alcohol, and juice. The items listed above may not be a complete list of foods and beverages you should avoid. Contact a dietitian for more information. How do I count calories when eating out?  Pay attention to portions. Often, portions are much larger when eating out. Try these tips to keep portions smaller: ? Consider sharing a meal instead of getting your own. ? If you get your own meal, eat only half of it. Before you start eating, ask for a container and put half of your meal into it. ? When available, consider ordering smaller portions from the menu instead of full portions.  Pay attention to your food and drink choices. Knowing the way food is cooked and what is included with the meal can help you eat fewer calories. ? If calories are listed on the menu, choose the lower-calorie options. ? Choose dishes that include vegetables, fruits, whole grains, low-fat dairy products, and lean proteins. ? Choose items that are boiled, broiled, grilled, or steamed. Avoid items that are buttered, battered, fried, or served with cream sauce. Items labeled as crispy are usually fried, unless stated otherwise. ? Choose water, low-fat milk, unsweetened iced tea, or other drinks without added sugar. If you want an alcoholic beverage, choose a lower-calorie option, such as a glass of wine or light beer. ? Ask for dressings, sauces, and syrups on the side. These are usually high in calories, so you should limit the amount you eat. ? If you want a salad, choose a garden salad and ask for grilled meats. Avoid extra toppings such as bacon, cheese, or fried items. Ask for the dressing  on  the side, or ask for olive oil and vinegar or lemon to use as dressing.  Estimate how many servings of a food you are given. Knowing serving sizes will help you be aware of how much food you are eating at restaurants. Where to find more information  Centers for Disease Control and Prevention: http://www.wolf.info/  U.[place]. Department of Agriculture: http://www.wilson-mendoza.org/ Summary  Calorie counting means keeping track of how many calories you eat and drink each day. If you eat fewer calories than your body needs, you should lose weight.  A healthy amount of weight to lose per week is usually 1-2 lb (0.5-0.9 kg). This usually means reducing your daily calorie intake by 500-750 calories.  The number of calories in a food can be found on a Nutrition Facts label. If a food does not have a Nutrition Facts label, try to look up the calories online or ask your dietitian for help.  Use smaller plates, glasses, and bowls for smaller portions and to prevent overeating.  Use your calories on foods and drinks that will fill you up and not leave you hungry shortly after a meal. This information is not intended to replace advice given to you by your health care provider. Make sure you discuss any questions you have with your health care provider. Document Revised: 08/12/2019 Document Reviewed: 08/12/2019 Elsevier Patient Education  2021 [employer].

## 2020-12-15 LAB — CBC WITH DIFFERENTIAL/PLATELET
Basophils Absolute: 0 10*3/uL (ref 0.0–0.2)
Basos: 1 %
EOS (ABSOLUTE): 0.1 10*3/uL (ref 0.0–0.4)
Eos: 2 %
Hematocrit: 41.7 % (ref 34.0–46.6)
Hemoglobin: 13.5 g/dL (ref 11.1–15.9)
Immature Grans (Abs): 0 10*3/uL (ref 0.0–0.1)
Immature Granulocytes: 0 %
Lymphocytes Absolute: 1.5 10*3/uL (ref 0.7–3.1)
Lymphs: 32 %
MCH: 27.6 pg (ref 26.6–33.0)
MCHC: 32.4 g/dL (ref 31.5–35.7)
MCV: 85 fL (ref 79–97)
Monocytes Absolute: 0.5 10*3/uL (ref 0.1–0.9)
Monocytes: 11 %
Neutrophils Absolute: 2.7 10*3/uL (ref 1.4–7.0)
Neutrophils: 54 %
Platelets: 287 10*3/uL (ref 150–450)
RBC: 4.9 x10E6/uL (ref 3.77–5.28)
RDW: 13.6 % (ref 11.7–15.4)
WBC: 4.9 10*3/uL (ref 3.4–10.8)

## 2020-12-15 LAB — LIPID PANEL
Chol/HDL Ratio: 4 ratio (ref 0.0–4.4)
Cholesterol, Total: 243 mg/dL — ABNORMAL HIGH (ref 100–199)
HDL: 61 mg/dL (ref >39)
LDL Chol Calc (NIH): 167 mg/dL — ABNORMAL HIGH (ref 0–99)
Triglycerides: 85 mg/dL (ref 0–149)
VLDL Cholesterol Cal: 15 mg/dL (ref 5–40)

## 2020-12-15 LAB — CMP14+EGFR
ALT: 53 IU/L — ABNORMAL HIGH (ref 0–32)
AST: 46 IU/L — ABNORMAL HIGH (ref 0–40)
Albumin/Globulin Ratio: 1.4 (ref 1.2–2.2)
Albumin: 4.2 g/dL (ref 3.8–4.8)
Alkaline Phosphatase: 113 IU/L (ref 44–121)
BUN/Creatinine Ratio: 16 (ref 12–28)
BUN: 10 mg/dL (ref 8–27)
Bilirubin Total: 0.5 mg/dL (ref 0.0–1.2)
CO2: 21 mmol/L (ref 20–29)
Calcium: 9.6 mg/dL (ref 8.7–10.3)
Chloride: 103 mmol/L (ref 96–106)
Creatinine, Ser: 0.64 mg/dL (ref 0.57–1.00)
Globulin, Total: 2.9 g/dL (ref 1.5–4.5)
Glucose: 92 mg/dL (ref 65–99)
Potassium: 4.4 mmol/L (ref 3.5–5.2)
Sodium: 141 mmol/L (ref 134–144)
Total Protein: 7.1 g/dL (ref 6.0–8.5)
eGFR: 96 mL/min/{1.73_m2} (ref >59)

## 2020-12-18 ENCOUNTER — Other Ambulatory Visit: Payer: Self-pay | Admitting: Family

## 2020-12-18 MED ORDER — ROSUVASTATIN CALCIUM 5 MG PO TABS: 5.0000 mg | ORAL_TABLET | ORAL | 3 refills | Status: DC

## 2020-12-25 ENCOUNTER — Telehealth: Payer: Self-pay | Admitting: Family Medicine

## 2020-12-25 NOTE — Telephone Encounter (Signed)
Insurance denied ozempic

## 2020-12-25 NOTE — Telephone Encounter (Signed)
Ok

## 2020-12-26 NOTE — Telephone Encounter (Signed)
Looks like insurance denied what would you like to do

## 2021-01-01 ENCOUNTER — Other Ambulatory Visit: Payer: Self-pay | Admitting: Family

## 2021-01-01 DIAGNOSIS — Z713 Dietary counseling and surveillance: Secondary | ICD-10-CM

## 2021-02-02 ENCOUNTER — Ambulatory Visit: Payer: BC Managed Care – PPO | Admitting: Family

## 2021-02-13 ENCOUNTER — Other Ambulatory Visit: Payer: Self-pay | Admitting: Family

## 2021-02-13 DIAGNOSIS — K5909 Other constipation: Secondary | ICD-10-CM

## 2021-02-16 ENCOUNTER — Other Ambulatory Visit: Payer: Self-pay | Admitting: Family

## 2021-03-15 ENCOUNTER — Encounter (INDEPENDENT_AMBULATORY_CARE_PROVIDER_SITE_OTHER): Payer: Self-pay

## 2021-03-16 ENCOUNTER — Ambulatory Visit: Payer: BC Managed Care – PPO | Admitting: Family

## 2021-03-20 ENCOUNTER — Encounter (INDEPENDENT_AMBULATORY_CARE_PROVIDER_SITE_OTHER): Payer: Self-pay

## 2021-03-23 ENCOUNTER — Ambulatory Visit: Payer: BC Managed Care – PPO

## 2021-03-27 ENCOUNTER — Ambulatory Visit (INDEPENDENT_AMBULATORY_CARE_PROVIDER_SITE_OTHER): Payer: BC Managed Care – PPO

## 2021-03-27 ENCOUNTER — Ambulatory Visit: Payer: BC Managed Care – PPO | Admitting: Nurse Practitioner

## 2021-03-27 ENCOUNTER — Encounter: Payer: Self-pay | Admitting: Nurse Practitioner

## 2021-03-27 ENCOUNTER — Other Ambulatory Visit: Payer: Self-pay

## 2021-03-27 VITALS — BP 123/82 | HR 96 | Temp 98.2°F | Ht 62.0 in | Wt 199.2 lb

## 2021-03-27 DIAGNOSIS — R109 Unspecified abdominal pain: Secondary | ICD-10-CM | POA: Diagnosis not present

## 2021-03-27 DIAGNOSIS — R8290 Unspecified abnormal findings in urine: Secondary | ICD-10-CM | POA: Insufficient documentation

## 2021-03-27 LAB — URINALYSIS, ROUTINE W REFLEX MICROSCOPIC
Bilirubin, UA: NEGATIVE
Glucose, UA: NEGATIVE
Ketones, UA: NEGATIVE
Nitrite, UA: NEGATIVE
Protein,UA: NEGATIVE
Specific Gravity, UA: >1.03 — ABNORMAL HIGH (ref 1.005–1.030)
Urobilinogen, Ur: 1 mg/dL (ref 0.2–1.0)
pH, UA: 5.5 (ref 5.0–7.5)

## 2021-03-27 LAB — MICROSCOPIC EXAMINATION
Epithelial Cells (non renal): NONE SEEN /hpf (ref 0–10)
Renal Epithel, UA: NONE SEEN /hpf

## 2021-03-27 MED ORDER — CEPHALEXIN 500 MG PO CAPS: 500.0000 mg | ORAL_CAPSULE | Freq: Two times a day (BID) | ORAL | 0 refills | Status: DC

## 2021-03-27 NOTE — Progress Notes (Signed)
Acute Office Visit  Subjective:    Patient ID: Angela Curry, female    DOB: [birth year], 70 y.o.   MRN: [id number]  Chief Complaint  Patient presents with   Abdominal Pain    Abdominal Pain  Patient is in today for Pain  She reports new onset abdominal pain. was not an injury that may have caused the pain. The pain started a few days ago and is worsening. The pain does radiate bilateral lower legs. The pain is described as aching, is mild in intensity, occurring intermittently. Symptoms are worse in the: mid-day, afternoon  Aggravating factors: none Relieving factors: none.  She has tried nothing for symptoms.   ---------------------------------------------------------------------------------------------------   Past Medical History:  Diagnosis Date   Allergy    ragweed   Asthma    "light asthma after pneumonia about 15 years ago"   Dysphagia    Fibrocystic breast disease    GERD (gastroesophageal reflux disease)    H/O cold sores    Hiatal hernia    Osteopenia    Psoriasis    Vitamin D deficiency     Past Surgical History:  Procedure Laterality Date   COLONOSCOPY     TONSILLECTOMY     TUBAL LIGATION     UPPER GASTROINTESTINAL ENDOSCOPY      Family History  Problem Relation Age of Onset   Diabetes Mother    Heart disease Father 21   Drug abuse Father    Early death Father    Cancer Paternal Aunt        breast   Breast cancer Paternal Aunt    Cancer Paternal Aunt        breast   Breast cancer Paternal Aunt    Esophageal cancer Neg Hx    Colon cancer Neg Hx    Stomach cancer Neg Hx    Rectal cancer Neg Hx     Social History   Socioeconomic History   Marital status: Divorced    Spouse name: Not on file   Number of children: Not on file   Years of education: Not on file   Highest education level: Not on file  Occupational History   Not on file  Tobacco Use   Smoking status: Never   Smokeless tobacco: Never  Vaping Use   Vaping Use:  Never used  Substance and Sexual Activity   Alcohol use: Yes    Comment: few days a week   Drug use: No   Sexual activity: Not on file  Other Topics Concern   Not on file  Social History Narrative   Not on file   Social Determinants of Health   Financial Resource Strain: Not on file  Food Insecurity: Not on file  Transportation Needs: Not on file  Physical Activity: Not on file  Stress: Not on file  Social Connections: Not on file  Intimate Partner Violence: Not on file    Outpatient Medications Prior to Visit  Medication Sig Dispense Refill   calcipotriene (DOVONOX) 0.005 % cream Apply topically 2 (two) times daily. 60 g 0   clobetasol ointment (TEMOVATE) 0.05 % Apply topically as needed. 60 g 2   Dexlansoprazole (DEXILANT) 30 MG capsule TAKE 1 CAPSULE BY MOUTH EVERY DAY 90 capsule 1   fluocinolone (SYNALAR) 0.01 % external solution Apply topically daily as needed.     fluticasone (FLONASE) 50 MCG/ACT nasal spray Place 2 sprays into both nostrils daily. 16 g 6   Fluticasone-Salmeterol (ADVAIR DISKUS) 100-50 MCG/DOSE  AEPB Inhale 1 puff into the lungs in the morning and at bedtime. (Patient taking differently: Inhale 1 puff into the lungs 2 (two) times daily as needed.) 60 each 2   ketoconazole (NIZORAL) 2 % shampoo every 14 (fourteen) days.     metroNIDAZOLE (METROCREAM) 0.75 % cream Apply topically daily.     rosuvastatin (CRESTOR) 5 MG tablet Take 1 tablet (5 mg total) by mouth every other day. 45 tablet 3   TRULANCE 3 MG TABS TAKE 1 TABLET BY MOUTH DAILY 90 tablet 0   valACYclovir (VALTREX) 1000 MG tablet TAKE 1 TABLET(1000 MG) BY MOUTH TWICE DAILY (Patient taking differently: as needed.) 180 tablet 1   phentermine 37.5 MG capsule Take 1 capsule (37.5 mg total) by mouth every morning. (Patient not taking: Reported on 03/27/2021) 30 capsule 2   cetirizine (ZYRTEC) 10 MG tablet Take 1 tablet (10 mg total) by mouth daily. 30 tablet 11   doxycycline (VIBRA-TABS) 100 MG tablet Take  100 mg by mouth daily.     No facility-administered medications prior to visit.    No Known Allergies  Review of Systems  Constitutional: Negative.   HENT: Negative.    Respiratory: Negative.    Gastrointestinal:  Positive for abdominal pain.  Musculoskeletal: Negative.   Skin:  Negative for rash.  All other systems reviewed and are negative.     Objective:    Physical Exam Vitals and nursing note reviewed.  Constitutional:      Appearance: She is well-developed.  HENT:     Head: Normocephalic.     Mouth/Throat:     Mouth: Mucous membranes are moist.  Eyes:     Conjunctiva/sclera: Conjunctivae normal.  Cardiovascular:     Rate and Rhythm: Normal rate and regular rhythm.     Pulses: Normal pulses.     Heart sounds: Normal heart sounds.  Pulmonary:     Effort: Pulmonary effort is normal.     Breath sounds: Normal breath sounds.  Abdominal:     General: Bowel sounds are decreased.     Palpations: Abdomen is soft.  Skin:    General: Skin is warm.     Findings: No rash.  Neurological:     Mental Status: She is alert and oriented to person, place, and time.    BP 123/82   Pulse 96   Temp 98.2 F (36.8 C) (Temporal)   Ht _0  (1.575 m)   Wt 199 lb 4 oz (90.4 kg)   BMI 36.44 kg/m  Wt Readings from Last 3 Encounters:  03/27/21 199 lb 4 oz (90.4 kg)  12/14/20 209 lb 6.4 oz (95 kg)  11/02/20 222 lb 3.2 oz (100.8 kg)    Health Maintenance Due  Topic Date Due   COVID-19 Vaccine (4 - Booster for Moderna series) 09/13/2020    There are no preventive care reminders to display for this patient.   Lab Results  Component Value Date   TSH 2.820 12/02/2017   Lab Results  Component Value Date   WBC 4.9 12/14/2020   HGB 13.5 12/14/2020   HCT 41.7 12/14/2020   MCV 85 12/14/2020   PLT 287 12/14/2020   Lab Results  Component Value Date   NA 141 12/14/2020   K 4.4 12/14/2020   CO2 21 12/14/2020   GLUCOSE 92 12/14/2020   BUN 10 12/14/2020   CREATININE 0.64  12/14/2020   BILITOT 0.5 12/14/2020   ALKPHOS 113 12/14/2020   AST 46 (H) 12/14/2020   ALT  53 (H) 12/14/2020   PROT 7.1 12/14/2020   ALBUMIN 4.2 12/14/2020   CALCIUM 9.6 12/14/2020   EGFR 96 12/14/2020   Lab Results  Component Value Date   CHOL 243 (H) 12/14/2020   Lab Results  Component Value Date   HDL 61 12/14/2020   Lab Results  Component Value Date   LDLCALC 167 (H) 12/14/2020   Lab Results  Component Value Date   TRIG 85 12/14/2020   Lab Results  Component Value Date   CHOLHDL 4.0 12/14/2020   Lab Results  Component Value Date   HGBA1C 5.9 04/25/2016       Assessment & Plan:   Problem List Items Addressed This Visit       Other   Abdominal pain - Primary    New onset abdominal pain in the past few days.  Symptoms of generalized abdominal pain without nausea, vomiting, rash or fever.  Completed KUB results pending.  Urinalysis completed, Tylenol/ibuprofen for pain.  Eric sent to pharmacy printed handouts given.  Follow-up with worsening unresolved symptoms.      Relevant Orders   Urinalysis, Routine w reflex microscopic   CULTURE, URINE COMPREHENSIVE   DG Abd 1 View   Abnormal finding on urinalysis    Patient is reporting abdominal pain with lower pelvic pain.  Completed urinalysis.  Results showing leukocytes, trace blood, leuk trase.  Cultures ordered, the patient Keflex by mouth twice daily.  Tylenol/ibuprofen as tolerated for pain.  Follow-up with worsening or unresolved symptoms.  Rx sent to pharmacy.  Printed handouts given.      Relevant Medications   cephALEXin (KEFLEX) 500 MG capsule     Meds ordered this encounter  Medications   cephALEXin (KEFLEX) 500 MG capsule    Sig: Take 1 capsule (500 mg total) by mouth 2 (two) times daily.    Dispense:  14 capsule    Refill:  0    Order Specific Question:   Supervising Provider    Answer:   Janora Norlander [[id number]]     Ivy Lynn, NP

## 2021-03-27 NOTE — Assessment & Plan Note (Signed)
Patient is reporting abdominal pain with lower pelvic pain.  Completed urinalysis.  Results showing leukocytes, trace blood, leuk trase.  Cultures ordered, the patient Keflex by mouth twice daily.  Tylenol/ibuprofen as tolerated for pain.  Follow-up with worsening or unresolved symptoms.  Rx sent to pharmacy.  Printed handouts given.

## 2021-03-27 NOTE — Patient Instructions (Signed)
Abdominal Pain, Adult Many things can cause belly (abdominal) pain. Most times, belly pain is not dangerous. Many cases of belly pain can be watched and treated at home. Sometimes, though, belly pain is serious. Yourdoctor will try to find the cause of your belly pain. Follow these instructions at home:  Medicines Take over-the-counter and prescription medicines only as told by your doctor. Do not take medicines that help you poop (laxatives) unless told by your doctor. General instructions Watch your belly pain for any changes. Drink enough fluid to keep your pee (urine) pale yellow. Keep all follow-up visits as told by your doctor. This is important. Contact a doctor if: Your belly pain changes or gets worse. You are not hungry, or you lose weight without trying. You are having trouble pooping (constipated) or have watery poop (diarrhea) for more than 2-3 days. You have pain when you pee or poop. Your belly pain wakes you up at night. Your pain gets worse with meals, after eating, or with certain foods. You are vomiting and cannot keep anything down. You have a fever. You have blood in your pee. Get help right away if: Your pain does not go away as soon as your doctor says it should. You cannot stop vomiting. Your pain is only in areas of your belly, such as the right side or the left lower part of the belly. You have bloody or black poop, or poop that looks like tar. You have very bad pain, cramping, or bloating in your belly. You have signs of not having enough fluid or water in your body (dehydration), such as: Dark pee, very little pee, or no pee. Cracked lips. Dry mouth. Sunken eyes. Sleepiness. Weakness. You have trouble breathing or chest pain. Summary Many cases of belly pain can be watched and treated at home. Watch your belly pain for any changes. Take over-the-counter and prescription medicines only as told by your doctor. Contact a doctor if your belly pain  changes or gets worse. Get help right away if you have very bad pain, cramping, or bloating in your belly. This information is not intended to replace advice given to you by your health care provider. Make sure you discuss any questions you have with your healthcare provider. Document Revised: 11/09/2018 Document Reviewed: 11/09/2018 Elsevier Patient Education  2022 [employer] Inc.  

## 2021-03-27 NOTE — Assessment & Plan Note (Signed)
New onset abdominal pain in the past few days.  Symptoms of generalized abdominal pain without nausea, vomiting, rash or fever.  Completed KUB results pending.  Urinalysis completed, Tylenol/ibuprofen for pain.  Eric sent to pharmacy printed handouts given.  Follow-up with worsening unresolved symptoms.

## 2021-03-30 LAB — CULTURE, URINE COMPREHENSIVE

## 2021-04-03 ENCOUNTER — Ambulatory Visit: Payer: BC Managed Care – PPO | Admitting: Family

## 2021-04-03 ENCOUNTER — Encounter: Payer: Self-pay | Admitting: Family

## 2021-05-02 ENCOUNTER — Ambulatory Visit: Payer: BC Managed Care – PPO | Admitting: Family

## 2021-05-02 ENCOUNTER — Encounter: Payer: Self-pay | Admitting: Family

## 2021-05-02 ENCOUNTER — Other Ambulatory Visit: Payer: Self-pay

## 2021-05-02 VITALS — BP 123/82 | HR 93 | Temp 98.1°F | Resp 20 | Ht 62.0 in | Wt 207.0 lb

## 2021-05-02 DIAGNOSIS — K5909 Other constipation: Secondary | ICD-10-CM | POA: Diagnosis not present

## 2021-05-02 DIAGNOSIS — Z23 Encounter for immunization: Secondary | ICD-10-CM

## 2021-05-02 MED ORDER — LINACLOTIDE 145 MCG PO CAPS: 145.0000 ug | ORAL_CAPSULE | Freq: Every day | ORAL | 1 refills | Status: DC

## 2021-05-02 NOTE — Progress Notes (Signed)
   Subjective:    Patient ID: Angela Curry, female    DOB: [birth year], 70 y.o.   MRN: [id number]  Chief Complaint  Patient presents with   Discuss meds   Pt presents to the office today to discuss constipation. She is currently taking Trulance 3 mg. She states this started working, but has not helped.  Constipation This is a chronic problem. The current episode started more than 1 year ago. Her stool frequency is 2 to 3 times per week. She has tried laxatives for the symptoms.     Review of Systems  Gastrointestinal:  Positive for constipation.  All other systems reviewed and are negative.     Objective:   Physical Exam Vitals reviewed.  Constitutional:      General: She is not in acute distress.    Appearance: She is well-developed. She is obese.  HENT:     Head: Normocephalic and atraumatic.  Eyes:     Pupils: Pupils are equal, round, and reactive to light.  Neck:     Thyroid: No thyromegaly.  Cardiovascular:     Rate and Rhythm: Normal rate and regular rhythm.     Heart sounds: Normal heart sounds. No murmur heard. Pulmonary:     Effort: Pulmonary effort is normal. No respiratory distress.     Breath sounds: Normal breath sounds. No wheezing.  Abdominal:     General: Bowel sounds are normal. There is no distension.     Palpations: Abdomen is soft.     Tenderness: There is no abdominal tenderness.  Musculoskeletal:        General: No tenderness. Normal range of motion.     Cervical back: Normal range of motion and neck supple.  Skin:    General: Skin is warm and dry.  Neurological:     Mental Status: She is alert and oriented to person, place, and time.     Cranial Nerves: No cranial nerve deficit.     Deep Tendon Reflexes: Reflexes are normal and symmetric.  Psychiatric:        Behavior: Behavior normal.        Thought Content: Thought content normal.        Judgment: Judgment normal.      BP 123/82   Pulse 93   Temp 98.1 F (36.7 C) (Temporal)    Resp 20   Ht 5\' 2"  (1.575 m)   Wt 207 lb (93.9 kg)   SpO2 95%   BMI 37.86 kg/m      Assessment & Plan:  Angela Curry comes in today with chief complaint of Discuss meds   Diagnosis and orders addressed:  1. Need for immunization against influenza - Flu Vaccine QUAD High Dose(Fluad)  2. Chronic constipation Stop Trulance and start Linzess  Force fluids High fiber diet  - linaclotide (LINZESS) 145 MCG CAPS capsule; Take 1 capsule (145 mcg total) by mouth daily before breakfast.  Dispense: 90 capsule; Refill: [place], FNP

## 2021-05-02 NOTE — Patient Instructions (Signed)

## 2021-08-02 ENCOUNTER — Other Ambulatory Visit: Payer: Self-pay | Admitting: Family

## 2021-08-02 DIAGNOSIS — K219 Gastro-esophageal reflux disease without esophagitis: Secondary | ICD-10-CM

## 2021-08-08 DIAGNOSIS — K219 Gastro-esophageal reflux disease without esophagitis: Secondary | ICD-10-CM

## 2021-08-08 MED ORDER — DEXLANSOPRAZOLE 30 MG PO CPDR: DELAYED_RELEASE_CAPSULE | ORAL | 0 refills | Status: DC

## 2021-09-24 ENCOUNTER — Encounter: Payer: Self-pay | Admitting: Gastroenterology

## 2021-10-17 ENCOUNTER — Encounter: Payer: Self-pay | Admitting: Family

## 2021-10-17 ENCOUNTER — Ambulatory Visit: Payer: BC Managed Care – PPO | Admitting: Family

## 2021-10-17 VITALS — BP 134/76 | HR 79 | Temp 97.7°F | Ht 62.0 in | Wt 216.2 lb

## 2021-10-17 DIAGNOSIS — K219 Gastro-esophageal reflux disease without esophagitis: Secondary | ICD-10-CM | POA: Diagnosis not present

## 2021-10-17 DIAGNOSIS — Z0001 Encounter for general adult medical examination with abnormal findings: Secondary | ICD-10-CM

## 2021-10-17 DIAGNOSIS — J4520 Mild intermittent asthma, uncomplicated: Secondary | ICD-10-CM

## 2021-10-17 DIAGNOSIS — K5909 Other constipation: Secondary | ICD-10-CM | POA: Diagnosis not present

## 2021-10-17 DIAGNOSIS — M5442 Lumbago with sciatica, left side: Secondary | ICD-10-CM

## 2021-10-17 DIAGNOSIS — E785 Hyperlipidemia, unspecified: Secondary | ICD-10-CM | POA: Diagnosis not present

## 2021-10-17 DIAGNOSIS — Z0000 Encounter for general adult medical examination without abnormal findings: Secondary | ICD-10-CM | POA: Diagnosis not present

## 2021-10-17 MED ORDER — MELOXICAM 15 MG PO TABS: 15.0000 mg | ORAL_TABLET | Freq: Every day | ORAL | 3 refills | Status: DC

## 2021-10-17 MED ORDER — BACLOFEN 10 MG PO TABS
10.0000 mg | ORAL_TABLET | Freq: Three times a day (TID) | ORAL | 2 refills | Status: DC
Start: 2021-10-17 — End: 2022-04-09

## 2021-10-17 MED ORDER — LINACLOTIDE 145 MCG PO CAPS: 145.0000 ug | ORAL_CAPSULE | Freq: Every day | ORAL | 1 refills | Status: DC

## 2021-10-17 MED ORDER — DEXLANSOPRAZOLE 30 MG PO CPDR: DELAYED_RELEASE_CAPSULE | ORAL | 1 refills | Status: DC

## 2021-10-17 MED ORDER — CLOBETASOL PROPIONATE 0.05 % EX OINT: TOPICAL_OINTMENT | CUTANEOUS | 2 refills | Status: DC | PRN

## 2021-10-17 NOTE — Progress Notes (Signed)
? ?Subjective:  ? ? Patient ID: Angela Curry, female    DOB: [birth year], 71 y.o.   MRN: [id number] ? ?Chief Complaint  ?Patient presents with  ? Back Pain  ?  Started Saturday when she moved a certin way going down in left hip.  ? Medical Management of Chronic Issues  ? ?Pt presents to the office today for CPE and chronic follow up. Has morbid obese with a BMI of 39 and GERD and constipation.  ?Back Pain ?This is a new problem. The current episode started 1 to 4 weeks ago. The problem occurs intermittently. The problem has been waxing and waning since onset. The pain is present in the lumbar spine. The quality of the pain is described as aching. The pain radiates to the left thigh. The pain is at a severity of 5/10. The pain is mild. The symptoms are aggravated by bending and standing. Associated symptoms include leg pain. She has tried NSAIDs for the symptoms. The treatment provided mild relief.  ?Asthma ?There is no cough, shortness of breath or wheezing. This is a chronic problem. The current episode started more than 1 year ago. The problem occurs intermittently. The problem has been waxing and waning. Associated symptoms include heartburn. Her symptoms are aggravated by pollen and strenuous activity. Her symptoms are alleviated by rest. She reports moderate improvement on treatment. Her past medical history is significant for asthma.  ?Gastroesophageal Reflux ?She complains of belching and heartburn. She reports no coughing or no wheezing. This is a chronic problem. The current episode started more than 1 year ago. The problem occurs occasionally. The problem has been waxing and waning. She has tried a PPI for the symptoms. The treatment provided moderate relief.  ?Constipation ?This is a chronic problem. The current episode started more than 1 year ago. The problem has been resolved since onset. Her stool frequency is 1 time per day. Associated symptoms include back pain. Risk factors include obesity.  She has tried laxatives for the symptoms. The treatment provided moderate relief.  ?Hyperlipidemia ?This is a chronic problem. The current episode started more than 1 year ago. Exacerbating diseases include obesity. Associated symptoms include leg pain. Pertinent negatives include no shortness of breath. Current antihyperlipidemic treatment includes statins. The current treatment provides moderate improvement of lipids. Risk factors for coronary artery disease include dyslipidemia, hypertension, obesity and a sedentary lifestyle.  ? ? ? ?Review of Systems  ?Respiratory:  Negative for cough, shortness of breath and wheezing.   ?Gastrointestinal:  Positive for constipation and heartburn.  ?Musculoskeletal:  Positive for back pain.  ?All other systems reviewed and are negative. ? ?Family History  ?Problem Relation Age of Onset  ? Diabetes Mother   ? Heart disease Father 56  ? Drug abuse Father   ? Early death Father   ? Cancer Paternal Aunt   ?     breast  ? Breast cancer Paternal Aunt   ? Cancer Paternal Aunt   ?     breast  ? Breast cancer Paternal Aunt   ? Esophageal cancer Neg Hx   ? Colon cancer Neg Hx   ? Stomach cancer Neg Hx   ? Rectal cancer Neg Hx   ? ?Social History  ? ?Socioeconomic History  ? Marital status: Divorced  ?  Spouse name: Not on file  ? Number of children: Not on file  ? Years of education: Not on file  ? Highest education level: Not on file  ?Occupational History  ?  Not on file  ?Tobacco Use  ? Smoking status: Never  ? Smokeless tobacco: Never  ?Vaping Use  ? Vaping Use: Never used  ?Substance and Sexual Activity  ? Alcohol use: Yes  ?  Comment: few days a week  ? Drug use: No  ? Sexual activity: Not on file  ?Other Topics Concern  ? Not on file  ?Social History Narrative  ? Not on file  ? ?Social Determinants of Health  ? ?Financial Resource Strain: Not on file  ?Food Insecurity: Not on file  ?Transportation Needs: Not on file  ?Physical Activity: Not on file  ?Stress: Not on file  ?Social  Connections: Not on file  ? ? ?   ?Objective:  ? Physical Exam ?Vitals reviewed.  ?Constitutional:   ?   General: She is not in acute distress. ?   Appearance: She is well-developed. She is obese.  ?HENT:  ?   Head: Normocephalic and atraumatic.  ?   Right Ear: Tympanic membrane normal.  ?   Left Ear: Tympanic membrane normal.  ?Eyes:  ?   Pupils: Pupils are equal, round, and reactive to light.  ?Neck:  ?   Thyroid: No thyromegaly.  ?Cardiovascular:  ?   Rate and Rhythm: Normal rate and regular rhythm.  ?   Heart sounds: Normal heart sounds. No murmur heard. ?Pulmonary:  ?   Effort: Pulmonary effort is normal. No respiratory distress.  ?   Breath sounds: Normal breath sounds. No wheezing.  ?Abdominal:  ?   General: Bowel sounds are normal. There is no distension.  ?   Palpations: Abdomen is soft.  ?   Tenderness: There is no abdominal tenderness.  ?Musculoskeletal:     ?   General: No tenderness. Normal range of motion.  ?   Cervical back: Normal range of motion and neck supple.  ?   Comments: Mild pain in lumbar with flexion  ?Skin: ?   General: Skin is warm and dry.  ?Neurological:  ?   Mental Status: She is alert and oriented to person, place, and time.  ?   Cranial Nerves: No cranial nerve deficit.  ?   Deep Tendon Reflexes: Reflexes are normal and symmetric.  ?Psychiatric:     ?   Behavior: Behavior normal.     ?   Thought Content: Thought content normal.     ?   Judgment: Judgment normal.  ? ? ? ? ? ?  BP 134/76   Pulse 79   Temp 97.7 ?F (36.5 ?C) (Temporal)   Ht 5' 2" (1.575 m)   Wt 216 lb 3.2 oz (98.1 kg)   BMI 39.54 kg/m?  ? ?Assessment & Plan:  ?Angela Curry comes in today with chief complaint of Back Pain (Started Saturday when she moved a certin way going down in left hip.) and Medical Management of Chronic Issues ? ? ?Diagnosis and orders addressed: ? ?1. Chronic constipation ?- linaclotide (LINZESS) 145 MCG CAPS capsule; Take 1 capsule (145 mcg total) by mouth daily before breakfast.   Dispense: 90 capsule; Refill: 1 ?- CMP14+EGFR ?- CBC with Differential/Platelet ? ?2. Gastroesophageal reflux disease, unspecified whether esophagitis present ?- Dexlansoprazole 30 MG capsule DR; TAKE 1 CAPSULE BY MOUTH EVERY DAY (NEEDS TO BE SEEN BEFORE NEXT REFILL)  Dispense: 90 capsule; Refill: 1 ?- CMP14+EGFR ?- CBC with Differential/Platelet ? ?3. Mild intermittent asthma without complication ?- CHY85+OYDX ?- CBC with Differential/Platelet ? ?4. Hyperlipidemia, unspecified hyperlipidemia type ?- CMP14+EGFR ?- CBC with  Differential/Platelet ? ?5. Morbid obesity ([place]) ?- CMP14+EGFR ?- CBC with Differential/Platelet ? ?6. Acute left-sided low back pain with left-sided sciatica ?Rest ?ROM exercises encouraged- handout given ?Restart mobic and baclofen as needed  ?- meloxicam (MOBIC) 15 MG tablet; Take 1 tablet (15 mg total) by mouth daily.  Dispense: 90 tablet; Refill: 3 ?- CMP14+EGFR ?- CBC with Differential/Platelet ?- baclofen (LIORESAL) 10 MG tablet; Take 1 tablet (10 mg total) by mouth 3 (three) times daily.  Dispense: 60 each; Refill: 2 ? ?7. Annual physical exam ?- CMP14+EGFR ?- CBC with Differential/Platelet ?- Lipid panel ?- TSH ? ? ?Labs pending ?Health Maintenance reviewed ?Diet and exercise encouraged ? ?Follow up plan: ?6 months  ? ? ?[name], FNP ? ? ? ?

## 2021-10-17 NOTE — Patient Instructions (Signed)
Sciatica Rehab °Ask your health care provider which exercises are safe for you. Do exercises exactly as told by your health care provider and adjust them as directed. It is normal to feel mild stretching, pulling, tightness, or discomfort as you do these exercises. Stop right away if you feel sudden pain or your pain gets worse. Do not begin these exercises until told by your health care provider. °Stretching and range-of-motion exercises °These exercises warm up your muscles and joints and improve the movement and flexibility of your hips and back. These exercises also help to relieve pain, numbness, and tingling. °Sciatic nerve glide °Sit in a chair with your head facing down toward your chest. Place your hands behind your back. Let your shoulders slump forward. °Slowly straighten one of your legs while you tilt your head back as if you are looking toward the ceiling. Only straighten your leg as far as you can without making your symptoms worse. °Hold this position for __________ seconds. °Slowly return to the starting position. °Repeat with your other leg. °Repeat __________ times. Complete this exercise __________ times a day. °Knee to chest with hip adduction and internal rotation ° °Lie on your back on a firm surface with both legs straight. °Bend one of your knees and move it up toward your chest until you feel a gentle stretch in your lower back and buttock. Then, move your knee toward the shoulder that is on the opposite side from your leg. This is hip adduction and internal rotation. °Hold your leg in this position by holding on to the front of your knee. °Hold this position for __________ seconds. °Slowly return to the starting position. °Repeat with your other leg. °Repeat __________ times. Complete this exercise __________ times a day. °Prone extension on elbows ° °Lie on your abdomen on a firm surface. A bed may be too soft for this exercise. °Prop yourself up on your elbows. °Use your arms to help  lift your chest up until you feel a gentle stretch in your abdomen and your lower back. °This will place some of your body weight on your elbows. If this is uncomfortable, try stacking pillows under your chest. °Your hips should stay down, against the surface that you are lying on. Keep your hip and back muscles relaxed. °Hold this position for __________ seconds. °Slowly relax your upper body and return to the starting position. °Repeat __________ times. Complete this exercise __________ times a day. °Strengthening exercises °These exercises build strength and endurance in your back. Endurance is the ability to use your muscles for a long time, even after they get tired. °Pelvic tilt °This exercise strengthens the muscles that lie deep in the abdomen. °Lie on your back on a firm surface. Bend your knees and keep your feet flat on the floor. °Tense your abdominal muscles. Tip your pelvis up toward the ceiling and flatten your lower back into the floor. °To help with this exercise, you may place a small towel under your lower back and try to push your back into the towel. °Hold this position for __________ seconds. °Let your muscles relax completely before you repeat this exercise. °Repeat __________ times. Complete this exercise __________ times a day. °Alternating arm and leg raises ° °Get on your hands and knees on a firm surface. If you are on a hard floor, you may want to use padding, such as an exercise mat, to cushion your knees. °Line up your arms and legs. Your hands should be directly below your shoulders,   and your knees should be directly below your hips. °Lift your left leg behind you. At the same time, raise your right arm and straighten it in front of you. °Do not lift your leg higher than your hip. °Do not lift your arm higher than your shoulder. °Keep your abdominal and back muscles tight. °Keep your hips facing the ground. °Do not arch your back. °Keep your balance carefully, and do not hold your  breath. °Hold this position for __________ seconds. °Slowly return to the starting position. °Repeat with your right leg and your left arm. °Repeat __________ times. Complete this exercise __________ times a day. °Posture and body mechanics °Good posture and healthy body mechanics can help to relieve stress in your body's tissues and joints. Body mechanics refers to the movements and positions of your body while you do your daily activities. Posture is part of body mechanics. Good posture means: °Your spine is in its natural S-curve position (neutral). °Your shoulders are pulled back slightly. °Your head is not tipped forward. °Follow these guidelines to improve your posture and body mechanics in your everyday activities. °Standing ° °When standing, keep your spine neutral and your feet about hip width apart. Keep a slight bend in your knees. Your ears, shoulders, and hips should line up. °When you do a task in which you stand in one place for a long time, place one foot up on a stable object that is 2-4 inches (5-10 cm) high, such as a footstool. This helps keep your spine neutral. °Sitting ° °When sitting, keep your spine neutral and keep your feet flat on the floor. Use a footrest, if necessary, and keep your thighs parallel to the floor. Avoid rounding your shoulders, and avoid tilting your head forward. °When working at a desk or a computer, keep your desk at a height where your hands are slightly lower than your elbows. Slide your chair under your desk so you are close enough to maintain good posture. °When working at a computer, place your monitor at a height where you are looking straight ahead and you do not have to tilt your head forward or downward to look at the screen. °Resting °When lying down and resting, avoid positions that are most painful for you. °If you have pain with activities such as sitting, bending, stooping, or squatting, lie in a position in which your body does not bend very much. For  example, avoid curling up on your side with your arms and knees near your chest (fetal position). °If you have pain with activities such as standing for a long time or reaching with your arms, lie with your spine in a neutral position and bend your knees slightly. Try the following positions: °Lying on your side with a pillow between your knees. °Lying on your back with a pillow under your knees. °Lifting ° °When lifting objects, keep your feet at least shoulder width apart and tighten your abdominal muscles. °Bend your knees and hips and keep your spine neutral. It is important to lift using the strength of your legs, not your back. Do not lock your knees straight out. °Always ask for help to lift heavy or awkward objects. °This information is not intended to replace advice given to you by your health care provider. Make sure you discuss any questions you have with your health care provider. °Document Revised: 10/23/2018 Document Reviewed: 07/23/2018 °Elsevier Patient Education © 2022 [employer] ° °

## 2021-10-18 LAB — CMP14+EGFR
ALT: 51 IU/L — ABNORMAL HIGH (ref 0–32)
AST: 44 IU/L — ABNORMAL HIGH (ref 0–40)
Albumin/Globulin Ratio: 1.7 (ref 1.2–2.2)
Albumin: 4.1 g/dL (ref 3.8–4.8)
Alkaline Phosphatase: 124 IU/L — ABNORMAL HIGH (ref 44–121)
BUN/Creatinine Ratio: 23 (ref 12–28)
BUN: 14 mg/dL (ref 8–27)
Bilirubin Total: 0.3 mg/dL (ref 0.0–1.2)
CO2: 21 mmol/L (ref 20–29)
Calcium: 9.2 mg/dL (ref 8.7–10.3)
Chloride: 105 mmol/L (ref 96–106)
Creatinine, Ser: 0.61 mg/dL (ref 0.57–1.00)
Globulin, Total: 2.4 g/dL (ref 1.5–4.5)
Glucose: 102 mg/dL — ABNORMAL HIGH (ref 70–99)
Potassium: 4.5 mmol/L (ref 3.5–5.2)
Sodium: 140 mmol/L (ref 134–144)
Total Protein: 6.5 g/dL (ref 6.0–8.5)
eGFR: 97 mL/min/{1.73_m2} (ref >59)

## 2021-10-18 LAB — CBC WITH DIFFERENTIAL/PLATELET
Basophils Absolute: 0 10*3/uL (ref 0.0–0.2)
Basos: 0 %
EOS (ABSOLUTE): 0.1 10*3/uL (ref 0.0–0.4)
Eos: 2 %
Hematocrit: 41.6 % (ref 34.0–46.6)
Hemoglobin: 13.4 g/dL (ref 11.1–15.9)
Immature Grans (Abs): 0 10*3/uL (ref 0.0–0.1)
Immature Granulocytes: 0 %
Lymphocytes Absolute: 1.4 10*3/uL (ref 0.7–3.1)
Lymphs: 25 %
MCH: 27.2 pg (ref 26.6–33.0)
MCHC: 32.2 g/dL (ref 31.5–35.7)
MCV: 84 fL (ref 79–97)
Monocytes Absolute: 0.5 10*3/uL (ref 0.1–0.9)
Monocytes: 10 %
Neutrophils Absolute: 3.5 10*3/uL (ref 1.4–7.0)
Neutrophils: 63 %
Platelets: 289 10*3/uL (ref 150–450)
RBC: 4.93 x10E6/uL (ref 3.77–5.28)
RDW: 13.5 % (ref 11.7–15.4)
WBC: 5.5 10*3/uL (ref 3.4–10.8)

## 2021-10-18 LAB — LIPID PANEL
Chol/HDL Ratio: 4.2 ratio (ref 0.0–4.4)
Cholesterol, Total: 230 mg/dL — ABNORMAL HIGH (ref 100–199)
HDL: 55 mg/dL (ref >39)
LDL Chol Calc (NIH): 146 mg/dL — ABNORMAL HIGH (ref 0–99)
Triglycerides: 161 mg/dL — ABNORMAL HIGH (ref 0–149)
VLDL Cholesterol Cal: 29 mg/dL (ref 5–40)

## 2021-10-18 LAB — TSH: TSH: 2.08 u[IU]/mL (ref 0.450–4.500)

## 2021-10-26 ENCOUNTER — Encounter: Payer: Self-pay | Admitting: Family Medicine

## 2021-11-07 ENCOUNTER — Other Ambulatory Visit: Payer: Self-pay | Admitting: Family

## 2021-11-07 DIAGNOSIS — K219 Gastro-esophageal reflux disease without esophagitis: Secondary | ICD-10-CM

## 2021-11-13 DIAGNOSIS — L409 Psoriasis, unspecified: Secondary | ICD-10-CM | POA: Diagnosis not present

## 2021-11-13 DIAGNOSIS — L603 Nail dystrophy: Secondary | ICD-10-CM | POA: Diagnosis not present

## 2021-11-13 DIAGNOSIS — B351 Tinea unguium: Secondary | ICD-10-CM | POA: Diagnosis not present

## 2021-12-05 ENCOUNTER — Encounter: Payer: Self-pay | Admitting: Family Medicine

## 2021-12-05 ENCOUNTER — Ambulatory Visit: Payer: BC Managed Care – PPO | Admitting: Family Medicine

## 2021-12-05 VITALS — BP 136/82 | HR 87 | Temp 97.8°F | Ht 62.0 in | Wt 215.0 lb

## 2021-12-05 DIAGNOSIS — M542 Cervicalgia: Secondary | ICD-10-CM

## 2021-12-05 DIAGNOSIS — T148XXA Other injury of unspecified body region, initial encounter: Secondary | ICD-10-CM

## 2021-12-05 MED ORDER — PREDNISONE 20 MG PO TABS: ORAL_TABLET | ORAL | 0 refills | Status: DC

## 2021-12-05 NOTE — Progress Notes (Signed)
BP 136/82   Pulse 87   Temp 97.8 F (36.6 C)   Ht '5\' 2"'$  (1.575 m)   Wt 215 lb (97.5 kg)   SpO2 97%   BMI 39.32 kg/m    Subjective:   Patient ID: Angela Curry, female    DOB: 1951/05/28, 71 y.o.   MRN: [id number]  HPI: Angela Curry is a 71 y.o. female presenting on 12/05/2021 for Headache (Started on Sunday. Right neck. Electrical shock sensation.)   HPI Headache Patient comes in describing a weird sensation headache that is described as an electrical shock signal that is right behind her right ear and starts near her neck and goes up to the back of her head and behind that right ear.  She says she is never had anything like this before.  She says it started about 4 days ago and she does admit she was reading a book a lot more recently and then she was concerned that maybe it was her glasses hitting that nerve and so she switched to her old glasses and has been not looking down reading the book as much since she finished it.  She said a couple nights ago she had this sensation that woke her up in the middle the night a few times because it would just shock her and feel like that jolts would come really quickly in that area.  She has continued to have it at different times during the day as well.  Always goes quickly and then passes quickly.  She denies any fevers or chills or ear pressure sinus congestion or head pressure or headache anywhere else besides this switch or electrical jolt as she describes it.  She cannot recall any specific trauma or falling.  Relevant past medical, surgical, family and social history reviewed and updated as indicated. Interim medical history since our last visit reviewed. Allergies and medications reviewed and updated.  Review of Systems  Constitutional:  Negative for chills and fever.  HENT:  Negative for congestion, ear discharge, ear pain, sinus pressure and sinus pain.   Eyes:  Negative for visual disturbance.  Respiratory:  Negative  for cough, chest tightness and shortness of breath.   Cardiovascular:  Negative for chest pain and leg swelling.  Musculoskeletal:  Positive for neck pain.  Skin:  Negative for rash.  Neurological:  Positive for headaches. Negative for dizziness, weakness, light-headedness and numbness.  Psychiatric/Behavioral:  Negative for agitation and behavioral problems.   All other systems reviewed and are negative.  Per HPI unless specifically indicated above   Allergies as of 12/05/2021   No Known Allergies      Medication List        Accurate as of Dec 05, 2021 10:41 AM. If you have any questions, ask your nurse or doctor.          baclofen 10 MG tablet Commonly known as: LIORESAL Take 1 tablet (10 mg total) by mouth 3 (three) times daily.   clobetasol ointment 0.05 % Commonly known as: TEMOVATE Apply topically as needed.   Dexlansoprazole 30 MG capsule DR TAKE 1 CAPSULE BY MOUTH EVERY DAY   fluocinolone 0.01 % external solution Commonly known as: SYNALAR Apply topically daily as needed.   fluticasone 50 MCG/ACT nasal spray Commonly known as: FLONASE Place 2 sprays into both nostrils daily.   Fluticasone-Salmeterol 100-50 MCG/DOSE Aepb Commonly known as: Advair Diskus Inhale 1 puff into the lungs in the morning and at bedtime. What changed:  when  to take this reasons to take this   ketoconazole 2 % shampoo Commonly known as: NIZORAL every 14 (fourteen) days.   linaclotide 145 MCG Caps capsule Commonly known as: Linzess Take 1 capsule (145 mcg total) by mouth daily before breakfast.   meloxicam 15 MG tablet Commonly known as: MOBIC Take 1 tablet (15 mg total) by mouth daily.   metroNIDAZOLE 0.75 % cream Commonly known as: METROCREAM Apply topically daily.   predniSONE 20 MG tablet Commonly known as: DELTASONE 2 po at same time daily for 5 days Started by: Fransisca Kaufmann Kimo Bancroft, MD   rosuvastatin 5 MG tablet Commonly known as: Crestor Take 1 tablet (5 mg  total) by mouth every other day.   valACYclovir 1000 MG tablet Commonly known as: VALTREX TAKE 1 TABLET(1000 MG) BY MOUTH TWICE DAILY What changed: See the new instructions.         Objective:   BP 136/82   Pulse 87   Temp 97.8 F (36.6 C)   Ht '5\' 2"'$  (1.575 m)   Wt 215 lb (97.5 kg)   SpO2 97%   BMI 39.32 kg/m   Wt Readings from Last 3 Encounters:  12/05/21 215 lb (97.5 kg)  10/17/21 216 lb 3.2 oz (98.1 kg)  05/02/21 207 lb (93.9 kg)    Physical Exam Vitals and nursing note reviewed.  Constitutional:      General: She is not in acute distress.    Appearance: She is well-developed. She is not diaphoretic.  Eyes:     Conjunctiva/sclera: Conjunctivae normal.     Pupils: Pupils are equal, round, and reactive to light.  Musculoskeletal:        General: Normal range of motion.     Cervical back: Tenderness present. No swelling, deformity, erythema or bony tenderness. No pain with movement. Normal range of motion.       Back:  Skin:    General: Skin is warm and dry.     Findings: No rash.  Neurological:     Mental Status: She is alert and oriented to person, place, and time.     Coordination: Coordination normal.  Psychiatric:        Behavior: Behavior normal.      Assessment & Plan:   Problem List Items Addressed This Visit   None Visit Diagnoses     Nerve compression    -  Primary       Concern for possible nerve compression due to the spasm and shooting nature coming from her neck, we will try a short course of prednisone to see if we can calm down the inflammation, if not improved from there may consider neurology referral. Follow up plan: Return if symptoms worsen or fail to improve.  Counseling provided for all of the vaccine components No orders of the defined types were placed in this encounter.   Caryl Pina, MD [facility] 12/05/2021, 10:41 AM

## 2021-12-19 ENCOUNTER — Encounter: Payer: Self-pay | Admitting: Family Medicine

## 2021-12-19 ENCOUNTER — Ambulatory Visit: Payer: BC Managed Care – PPO | Admitting: Family Medicine

## 2021-12-19 VITALS — BP 136/88 | HR 81 | Temp 97.8°F | Ht 62.0 in | Wt 216.4 lb

## 2021-12-19 DIAGNOSIS — J0110 Acute frontal sinusitis, unspecified: Secondary | ICD-10-CM | POA: Diagnosis not present

## 2021-12-19 MED ORDER — ALBUTEROL SULFATE HFA 108 (90 BASE) MCG/ACT IN AERS: 2.0000 | INHALATION_SPRAY | Freq: Four times a day (QID) | RESPIRATORY_TRACT | 0 refills | Status: DC | PRN

## 2021-12-19 MED ORDER — DOXYCYCLINE HYCLATE 100 MG PO TABS: 100.0000 mg | ORAL_TABLET | Freq: Two times a day (BID) | ORAL | 0 refills | Status: AC

## 2021-12-19 MED ORDER — PREDNISONE 10 MG (21) PO TBPK: ORAL_TABLET | ORAL | 0 refills | Status: DC

## 2021-12-19 NOTE — Progress Notes (Signed)
Assessment & Plan:  1. Acute non-recurrent frontal sinusitis Education provided on sinus infections. Discussed symptom management.  - albuterol (VENTOLIN HFA) 108 (90 Base) MCG/ACT inhaler; Inhale 2 puffs into the lungs every 6 (six) hours as needed for wheezing or shortness of breath.  Dispense: 18 g; Refill: 0 - doxycycline (VIBRA-TABS) 100 MG tablet; Take 1 tablet (100 mg total) by mouth 2 (two) times daily for 7 days.  Dispense: 14 tablet; Refill: 0 - predniSONE (STERAPRED UNI-PAK 21 TAB) 10 MG (21) TBPK tablet; As directed x 6 days  Dispense: 21 tablet; Refill: 0   Follow up plan: Return if symptoms worsen or fail to improve.  Hendricks Limes, MSN, APRN, FNP-C [facility]  Subjective:   Patient ID: Angela Curry, female    DOB: [birth year], 71 y.o.   MRN: [id number]  HPI: Angela Curry is a 71 y.o. female presenting on 12/19/2021 for Cough and Nasal Congestion (X 5 days)  Patient complains of cough, head/chest congestion, headache, sneezing, facial pain/pressure, postnasal drainage, shortness of breath, and wheezing. Onset of symptoms was 5 days ago, gradually worsening since that time. She is drinking plenty of fluids. Evaluation to date: none. Treatment to date:  Advair and Tylenol . She has a history of asthma. She does not smoke.    ROS: Negative unless specifically indicated above in HPI.   Relevant past medical history reviewed and updated as indicated.   Allergies and medications reviewed and updated.   Current Outpatient Medications:    baclofen (LIORESAL) 10 MG tablet, Take 1 tablet (10 mg total) by mouth 3 (three) times daily., Disp: 60 each, Rfl: 2   clobetasol ointment (TEMOVATE) 0.05 %, Apply topically as needed., Disp: 60 g, Rfl: 2   Dexlansoprazole 30 MG capsule DR, TAKE 1 CAPSULE BY MOUTH EVERY DAY, Disp: 90 capsule, Rfl: 1   fluocinolone (SYNALAR) 0.01 % external solution, Apply topically daily as needed., Disp: , Rfl:     fluticasone (FLONASE) 50 MCG/ACT nasal spray, Place 2 sprays into both nostrils daily., Disp: 16 g, Rfl: 6   ketoconazole (NIZORAL) 2 % shampoo, every 14 (fourteen) days., Disp: , Rfl:    linaclotide (LINZESS) 145 MCG CAPS capsule, Take 1 capsule (145 mcg total) by mouth daily before breakfast., Disp: 90 capsule, Rfl: 1   meloxicam (MOBIC) 15 MG tablet, Take 1 tablet (15 mg total) by mouth daily., Disp: 90 tablet, Rfl: 3   metroNIDAZOLE (METROCREAM) 0.75 % cream, Apply topically daily., Disp: , Rfl:    valACYclovir (VALTREX) 1000 MG tablet, TAKE 1 TABLET(1000 MG) BY MOUTH TWICE DAILY (Patient taking differently: as needed.), Disp: 180 tablet, Rfl: 1   Fluticasone-Salmeterol (ADVAIR DISKUS) 100-50 MCG/DOSE AEPB, Inhale 1 puff into the lungs in the morning and at bedtime. (Patient taking differently: Inhale 1 puff into the lungs 2 (two) times daily as needed.), Disp: 60 each, Rfl: 2   rosuvastatin (CRESTOR) 5 MG tablet, Take 1 tablet (5 mg total) by mouth every other day. (Patient not taking: Reported on 12/19/2021), Disp: 45 tablet, Rfl: 3  Allergies  Allergen Reactions   Amoxicillin Other (See Comments)    Objective:   BP 136/88   Pulse 81   Temp 97.8 F (36.6 C) (Temporal)   Ht '5\' 2"'$  (1.575 m)   Wt 216 lb 6.4 oz (98.2 kg)   SpO2 95%   BMI 39.58 kg/m    Physical Exam Vitals reviewed.  Constitutional:      General: She is not in  acute distress.    Appearance: Normal appearance. She is not ill-appearing, toxic-appearing or diaphoretic.  HENT:     Head: Normocephalic and atraumatic.     Right Ear: Tympanic membrane, ear canal and external ear normal. There is no impacted cerumen.     Left Ear: Tympanic membrane, ear canal and external ear normal. There is no impacted cerumen.     Nose: Congestion present. No rhinorrhea.     Right Sinus: Frontal sinus tenderness present. No maxillary sinus tenderness.     Left Sinus: Frontal sinus tenderness present. No maxillary sinus tenderness.      Mouth/Throat:     Mouth: Mucous membranes are moist.     Pharynx: Oropharynx is clear. No oropharyngeal exudate or posterior oropharyngeal erythema.  Eyes:     General: No scleral icterus.       Right eye: No discharge.        Left eye: No discharge.     Conjunctiva/sclera: Conjunctivae normal.  Cardiovascular:     Rate and Rhythm: Normal rate and regular rhythm.     Heart sounds: Normal heart sounds. No murmur heard.   No friction rub. No gallop.  Pulmonary:     Effort: Pulmonary effort is normal. No respiratory distress.     Breath sounds: Normal breath sounds. No stridor. No wheezing, rhonchi or rales.  Musculoskeletal:        General: Normal range of motion.     Cervical back: Normal range of motion.  Lymphadenopathy:     Cervical: No cervical adenopathy.  Skin:    General: Skin is warm and dry.     Capillary Refill: Capillary refill takes less than 2 seconds.  Neurological:     General: No focal deficit present.     Mental Status: She is alert and oriented to person, place, and time. Mental status is at baseline.  Psychiatric:        Mood and Affect: Mood normal.        Behavior: Behavior normal.        Thought Content: Thought content normal.        Judgment: Judgment normal.

## 2022-02-20 ENCOUNTER — Encounter (INDEPENDENT_AMBULATORY_CARE_PROVIDER_SITE_OTHER): Payer: Self-pay

## 2022-03-11 ENCOUNTER — Other Ambulatory Visit: Payer: Self-pay | Admitting: Family Medicine

## 2022-03-12 ENCOUNTER — Ambulatory Visit: Payer: BC Managed Care – PPO | Admitting: Family

## 2022-03-12 ENCOUNTER — Encounter: Payer: Self-pay | Admitting: Family

## 2022-03-12 DIAGNOSIS — U071 COVID-19: Secondary | ICD-10-CM | POA: Diagnosis not present

## 2022-03-12 MED ORDER — MOLNUPIRAVIR EUA 200MG CAPSULE: 4.0000 | ORAL_CAPSULE | Freq: Two times a day (BID) | ORAL | 0 refills | Status: AC

## 2022-03-12 NOTE — Progress Notes (Signed)
Virtual Visit  Note Due to COVID-19 pandemic this visit was conducted virtually. This visit type was conducted due to national recommendations for restrictions regarding the COVID-19 Pandemic (e.g. social distancing, sheltering in place) in an effort to limit this patient's exposure and mitigate transmission in our community. All issues noted in this document were discussed and addressed.  A physical exam was not performed with this format.  I connected with Angela Curry on 03/12/22 at 9:07 AM  by telephone and verified that I am speaking with the correct person using two identifiers. Angela Curry is currently located at home and no one is currently with her during visit. The provider, [name], FNP is located in their office at time of visit.  I discussed the limitations, risks, security and privacy concerns of performing an evaluation and management service by telephone and the availability of in person appointments. I also discussed with the patient that there may be a patient responsible charge related to this service. The patient expressed understanding and agreed to proceed.  Angela Curry, Angela Curry are scheduled for a virtual visit with your provider today.    Just as we do with appointments in the office, we must obtain your consent to participate.  Your consent will be active for this visit and any virtual visit you may have with one of our providers in the next 365 days.    If you have a MyChart account, I can also send a copy of this consent to you electronically.  All virtual visits are billed to your insurance company just like a traditional visit in the office.  As this is a virtual visit, video technology does not allow for your provider to perform a traditional examination.  This may limit your provider's ability to fully assess your condition.  If your provider identifies any concerns that need to be evaluated in person or the need to arrange testing such as labs, EKG,  etc, we will make arrangements to do so.    Although advances in technology are sophisticated, we cannot ensure that it will always work on either your end or our end.  If the connection with a video visit is poor, we may have to switch to a telephone visit.  With either a video or telephone visit, we are not always able to ensure that we have a secure connection.   I need to obtain your verbal consent now.   Are you willing to proceed with your visit today?   Angela Curry has provided verbal consent on 03/12/2022 for a virtual visit (video or telephone).   [name], [place] 03/12/2022  9:13 AM    History and Present Illness:  PT calls the office today with COVID positive with home test. States she was exposed to a positive co-worker and states her symptoms started Sunday night with body aches, fatigue, fever, cough.  Cough This is a new problem. The current episode started in the past 7 days. The problem has been gradually worsening. The problem occurs every few minutes. The cough is Non-productive. Associated symptoms include chills, a fever, headaches, myalgias, nasal congestion, postnasal drip, rhinorrhea, a sore throat and shortness of breath. Pertinent negatives include no ear congestion, ear pain or wheezing. The symptoms are aggravated by lying down. She has tried rest for the symptoms. The treatment provided mild relief.      Review of Systems  Constitutional:  Positive for chills and fever.  HENT:  Positive for postnasal drip, rhinorrhea and  sore throat. Negative for ear pain.   Respiratory:  Positive for cough and shortness of breath. Negative for wheezing.   Musculoskeletal:  Positive for myalgias.  Neurological:  Positive for headaches.     Observations/Objective: NO SOB or distress noted, intermittent dry cough  Assessment and Plan: 1. Telehealth encounter for confirmed COVID-19 COVID positive, rest, force fluids, tylenol as needed, Quarantine for at least 5  days and you are fever free, then must wear a mask out in public from day 3-64, report any worsening symptoms such as increased shortness of breath, swelling, or continued high fevers. Possible adverse effects discussed with antivirals.  - molnupiravir EUA (LAGEVRIO) 200 mg CAPS capsule; Take 4 capsules (800 mg total) by mouth 2 (two) times daily for 5 days.  Dispense: 40 capsule; Refill: 0      I discussed the assessment and treatment plan with the patient. The patient was provided an opportunity to ask questions and all were answered. The patient agreed with the plan and demonstrated an understanding of the instructions.   The patient was advised to call back or seek an in-person evaluation if the symptoms worsen or if the condition fails to improve as anticipated.  The above assessment and management plan was discussed with the patient. The patient verbalized understanding of and has agreed to the management plan. Patient is aware to call the clinic if symptoms persist or worsen. Patient is aware when to return to the clinic for a follow-up visit. Patient educated on when it is appropriate to go to the emergency department.   Time call ended:  9:19 AM   I provided 12 minutes of  non face-to-face time during this encounter.    [name], FNP

## 2022-03-20 ENCOUNTER — Encounter (INDEPENDENT_AMBULATORY_CARE_PROVIDER_SITE_OTHER): Payer: BC Managed Care – PPO

## 2022-03-20 DIAGNOSIS — U071 COVID-19: Secondary | ICD-10-CM | POA: Diagnosis not present

## 2022-03-21 MED ORDER — PREDNISONE 10 MG (21) PO TBPK: ORAL_TABLET | ORAL | 0 refills | Status: DC

## 2022-03-21 MED ORDER — BENZONATATE 200 MG PO CAPS: 200.0000 mg | ORAL_CAPSULE | Freq: Three times a day (TID) | ORAL | 1 refills | Status: DC | PRN

## 2022-03-21 NOTE — Telephone Encounter (Signed)
Prednisone and tessalon Prescription sent to pharmacy. Follow up if symptoms worsen or do not improve.

## 2022-03-29 ENCOUNTER — Telehealth: Payer: BC Managed Care – PPO | Admitting: Family

## 2022-03-29 ENCOUNTER — Encounter: Payer: Self-pay | Admitting: Family

## 2022-03-29 DIAGNOSIS — R399 Unspecified symptoms and signs involving the genitourinary system: Secondary | ICD-10-CM | POA: Diagnosis not present

## 2022-03-29 LAB — URINALYSIS, COMPLETE
Bilirubin, UA: NEGATIVE
Glucose, UA: NEGATIVE
Nitrite, UA: NEGATIVE
Protein,UA: NEGATIVE
Specific Gravity, UA: 1.025 (ref 1.005–1.030)
Urobilinogen, Ur: 0.2 mg/dL (ref 0.2–1.0)
pH, UA: 5 (ref 5.0–7.5)

## 2022-03-29 LAB — MICROSCOPIC EXAMINATION: Renal Epithel, UA: NONE SEEN /hpf

## 2022-03-29 MED ORDER — CEPHALEXIN 500 MG PO CAPS: 500.0000 mg | ORAL_CAPSULE | Freq: Two times a day (BID) | ORAL | 0 refills | Status: DC

## 2022-03-29 NOTE — Progress Notes (Signed)
Virtual Visit Consent   Angela Curry, you are scheduled for a virtual visit with a [facility] provider today. Just as with appointments in the office, your consent must be obtained to participate. Your consent will be active for this visit and any virtual visit you may have with one of our providers in the next 365 days. If you have a MyChart account, a copy of this consent can be sent to you electronically.  As this is a virtual visit, video technology does not allow for your provider to perform a traditional examination. This may limit your provider's ability to fully assess your condition. If your provider identifies any concerns that need to be evaluated in person or the need to arrange testing (such as labs, EKG, etc.), we will make arrangements to do so. Although advances in technology are sophisticated, we cannot ensure that it will always work on either your end or our end. If the connection with a video visit is poor, the visit may have to be switched to a telephone visit. With either a video or telephone visit, we are not always able to ensure that we have a secure connection.  By engaging in this virtual visit, you consent to the provision of healthcare and authorize for your insurance to be billed (if applicable) for the services provided during this visit. Depending on your insurance coverage, you may receive a charge related to this service.  I need to obtain your verbal consent now. Are you willing to proceed with your visit today? Angela Curry has provided verbal consent on 03/29/2022 for a virtual visit (video or telephone). [name], FNP  Date: 03/29/2022 2:55 PM  Virtual Visit via Video Note   I, [name], connected with  Angela Curry  ([id number], December 15, 1950) on 03/29/22 at  2:55 PM EDT by a video-enabled telemedicine application and verified that I am speaking with the correct person using two identifiers.  Location: Patient: Virtual Visit  Location Patient: work Provider: Ecologist: Home Office   I discussed the limitations of evaluation and management by telemedicine and the availability of in person appointments. The patient expressed understanding and agreed to proceed.    History of Present Illness: Angela Curry is a 72 y.o. who identifies as a female who was assigned female at birth, and is being seen today for dysuria that started yesterday.  HPI: Dysuria  This is a new problem. The current episode started yesterday. The problem occurs intermittently. The problem has been waxing and waning. The quality of the pain is described as burning (pressure). The pain is at a severity of 5/10. The pain is mild. There has been no fever. Associated symptoms include flank pain and frequency. Pertinent negatives include no hematuria, hesitancy, nausea, urgency or vomiting. She has tried increased fluids for the symptoms. The treatment provided mild relief.    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   Right foot injury 05/01/2020   Hormone replacement therapy (HRT) 07/16/2018   Chronic pain of left knee 93/79/0240   Folic acid deficiency 97/35/3299   Rosacea 07/31/2017   Closed navicular fracture of right ankle 01/09/2016   Morbid obesity ([place]) 01/09/2016   Diverticulitis of colon without hemorrhage 06/01/2015   GERD (gastroesophageal reflux disease) 08/24/2014   Chronic constipation 08/24/2014   Palpitations 01/04/2013   Edema  01/04/2013   Hyperlipidemia 01/04/2013   Osteopenia    Fibrocystic breast disease    Hiatal hernia    Psoriasis 11/10/2012    Allergies:  Allergies  Allergen Reactions   Amoxicillin Other (See Comments)   Medications:  Current Outpatient Medications:    cephALEXin (KEFLEX) 500 MG capsule, Take 1 capsule (500 mg total) by mouth 2  (two) times daily., Disp: 14 capsule, Rfl: 0   albuterol (VENTOLIN HFA) 108 (90 Base) MCG/ACT inhaler, Inhale 2 puffs into the lungs every 6 (six) hours as needed for wheezing or shortness of breath., Disp: 18 g, Rfl: 0   baclofen (LIORESAL) 10 MG tablet, Take 1 tablet (10 mg total) by mouth 3 (three) times daily., Disp: 60 each, Rfl: 2   benzonatate (TESSALON) 200 MG capsule, Take 1 capsule (200 mg total) by mouth 3 (three) times daily as needed., Disp: 30 capsule, Rfl: 1   clobetasol ointment (TEMOVATE) 0.05 %, Apply topically as needed., Disp: 60 g, Rfl: 2   Dexlansoprazole 30 MG capsule DR, TAKE 1 CAPSULE BY MOUTH EVERY DAY, Disp: 90 capsule, Rfl: 1   fluocinolone (SYNALAR) 0.01 % external solution, Apply topically daily as needed., Disp: , Rfl:    fluticasone (FLONASE) 50 MCG/ACT nasal spray, Place 2 sprays into both nostrils daily., Disp: 16 g, Rfl: 6   Fluticasone-Salmeterol (ADVAIR DISKUS) 100-50 MCG/DOSE AEPB, Inhale 1 puff into the lungs in the morning and at bedtime. (Patient taking differently: Inhale 1 puff into the lungs 2 (two) times daily as needed.), Disp: 60 each, Rfl: 2   ketoconazole (NIZORAL) 2 % shampoo, every 14 (fourteen) days., Disp: , Rfl:    linaclotide (LINZESS) 145 MCG CAPS capsule, Take 1 capsule (145 mcg total) by mouth daily before breakfast., Disp: 90 capsule, Rfl: 1   meloxicam (MOBIC) 15 MG tablet, Take 1 tablet (15 mg total) by mouth daily., Disp: 90 tablet, Rfl: 3   metroNIDAZOLE (METROCREAM) 0.75 % cream, Apply topically daily., Disp: , Rfl:    predniSONE (STERAPRED UNI-PAK 21 TAB) 10 MG (21) TBPK tablet, Use as directed, Disp: 21 tablet, Rfl: 0   rosuvastatin (CRESTOR) 5 MG tablet, Take 1 tablet (5 mg total) by mouth every other day. (Patient not taking: Reported on 12/19/2021), Disp: 45 tablet, Rfl: 3   valACYclovir (VALTREX) 1000 MG tablet, TAKE 1 TABLET(1000 MG) BY MOUTH TWICE DAILY (Patient taking differently: as needed.), Disp: 180 tablet, Rfl:  1  Observations/Objective: Patient is well-developed, well-nourished in no acute distress.  Resting comfortably  Head is normocephalic, atraumatic.  No labored breathing.  Speech is clear and coherent with logical content.  Patient is alert and oriented at baseline.    Assessment and Plan: 1. UTI symptoms - Urinalysis, Complete - Urine Culture - cephALEXin (KEFLEX) 500 MG capsule; Take 1 capsule (500 mg total) by mouth 2 (two) times daily.  Dispense: 14 capsule; Refill: 0  Force fluids AZO over the counter X2 days Culture pending Follow up if symptoms worsen or do not improve   Follow Up Instructions: I discussed the assessment and treatment plan with the patient. The patient was provided an opportunity to ask questions and all were answered. The patient agreed with the plan and demonstrated an understanding of the instructions.  A copy of instructions were sent to the patient via MyChart unless otherwise noted below.     The patient was advised to call back or seek an in-person evaluation if the symptoms worsen or if the condition fails to improve as anticipated.  Time:  I spent 8 minutes with the patient via telehealth technology discussing the above problems/concerns.    [name], FNP

## 2022-03-29 NOTE — Patient Instructions (Signed)
Urinary Tract Infection, Adult  A urinary tract infection (UTI) is an infection of any part of the urinary tract. The urinary tract includes the kidneys, ureters, bladder, and urethra. These organs make, store, and get rid of urine in the body. An upper UTI affects the ureters and kidneys. A lower UTI affects the bladder and urethra. What are the causes? Most urinary tract infections are caused by bacteria in your genital area around your urethra, where urine leaves your body. These bacteria grow and cause inflammation of your urinary tract. What increases the risk? You are more likely to develop this condition if: You have a urinary catheter that stays in place. You are not able to control when you urinate or have a bowel movement (incontinence). You are female and you: Use a spermicide or diaphragm for birth control. Have low estrogen levels. Are pregnant. You have certain genes that increase your risk. You are sexually active. You take antibiotic medicines. You have a condition that causes your flow of urine to slow down, such as: An enlarged prostate, if you are female. Blockage in your urethra. A kidney stone. A nerve condition that affects your bladder control (neurogenic bladder). Not getting enough to drink, or not urinating often. You have certain medical conditions, such as: Diabetes. A weak disease-fighting system (immunesystem). Sickle cell disease. Gout. Spinal cord injury. What are the signs or symptoms? Symptoms of this condition include: Needing to urinate right away (urgency). Frequent urination. This may include small amounts of urine each time you urinate. Pain or burning with urination. Blood in the urine. Urine that smells bad or unusual. Trouble urinating. Cloudy urine. Vaginal discharge, if you are female. Pain in the abdomen or the lower back. You may also have: Vomiting or a decreased appetite. Confusion. Irritability or tiredness. A fever or  chills. Diarrhea. The first symptom in older adults may be confusion. In some cases, they may not have any symptoms until the infection has worsened. How is this diagnosed? This condition is diagnosed based on your medical history and a physical exam. You may also have other tests, including: Urine tests. Blood tests. Tests for STIs (sexually transmitted infections). If you have had more than one UTI, a cystoscopy or imaging studies may be done to determine the cause of the infections. How is this treated? Treatment for this condition includes: Antibiotic medicine. Over-the-counter medicines to treat discomfort. Drinking enough water to stay hydrated. If you have frequent infections or have other conditions such as a kidney stone, you may need to see a health care provider who specializes in the urinary tract (urologist). In rare cases, urinary tract infections can cause sepsis. Sepsis is a life-threatening condition that occurs when the body responds to an infection. Sepsis is treated in the hospital with IV antibiotics, fluids, and other medicines. Follow these instructions at home:  Medicines Take over-the-counter and prescription medicines only as told by your health care provider. If you were prescribed an antibiotic medicine, take it as told by your health care provider. Do not stop using the antibiotic even if you start to feel better. General instructions Make sure you: Empty your bladder often and completely. Do not hold urine for long periods of time. Empty your bladder after sex. Wipe from front to back after urinating or having a bowel movement if you are female. Use each tissue only one time when you wipe. Drink enough fluid to keep your urine pale yellow. Keep all follow-up visits. This is important. Contact a health   care provider if: Your symptoms do not get better after 1-2 days. Your symptoms go away and then return. Get help right away if: You have severe pain in  your back or your lower abdomen. You have a fever or chills. You have nausea or vomiting. Summary A urinary tract infection (UTI) is an infection of any part of the urinary tract, which includes the kidneys, ureters, bladder, and urethra. Most urinary tract infections are caused by bacteria in your genital area. Treatment for this condition often includes antibiotic medicines. If you were prescribed an antibiotic medicine, take it as told by your health care provider. Do not stop using the antibiotic even if you start to feel better. Keep all follow-up visits. This is important. This information is not intended to replace advice given to you by your health care provider. Make sure you discuss any questions you have with your health care provider. Document Revised: 02/11/2020 Document Reviewed: 02/11/2020 Elsevier Patient Education  2023 [employer]  

## 2022-03-29 NOTE — Telephone Encounter (Signed)
Scheduled

## 2022-04-02 LAB — URINE CULTURE

## 2022-04-03 ENCOUNTER — Telehealth: Payer: Self-pay

## 2022-04-03 NOTE — Telephone Encounter (Signed)
Patient called about her urine results.  It says urine positive and then urine negative.  Please advise if urine is positive or negative. I could not pull up lab results which is why new encounter was created.  Covering PCP please advise

## 2022-04-03 NOTE — Telephone Encounter (Signed)
I believe Angela Curry suspected UTI based on initial microscopy/ dip. Urine culture however did not grow significant bacteria, which suggests that she may have either had a really early MILD case of UTI or that she doesn't have one at all.  If she feels better on abx, finish them.  Otherwise will need f/u with PCP to discuss other diagnosis which might cause her symptoms.

## 2022-04-08 ENCOUNTER — Telehealth: Payer: Self-pay | Admitting: Family

## 2022-04-08 NOTE — Telephone Encounter (Signed)
Angela Curry, would you like to be double booked tomorrow or would you prefer for her to see someone else?

## 2022-04-08 NOTE — Telephone Encounter (Signed)
Fine to double book me

## 2022-04-08 NOTE — Telephone Encounter (Signed)
Appt made patient aware  °

## 2022-04-09 ENCOUNTER — Ambulatory Visit: Payer: BC Managed Care – PPO | Admitting: Family

## 2022-04-09 ENCOUNTER — Encounter (HOSPITAL_COMMUNITY): Payer: Self-pay

## 2022-04-09 ENCOUNTER — Encounter: Payer: Self-pay | Admitting: Family

## 2022-04-09 ENCOUNTER — Ambulatory Visit (HOSPITAL_COMMUNITY)
Admission: RE | Admit: 2022-04-09 | Discharge: 2022-04-09 | Disposition: A | Discharge status: Home / Self Care | Payer: BC Managed Care – PPO | Source: Ambulatory Visit | Attending: Family | Admitting: Family

## 2022-04-09 VITALS — BP 133/76 | HR 86 | Temp 97.8°F | Ht 62.0 in | Wt 217.0 lb

## 2022-04-09 DIAGNOSIS — I700 Atherosclerosis of aorta: Secondary | ICD-10-CM | POA: Diagnosis not present

## 2022-04-09 DIAGNOSIS — Z8719 Personal history of other diseases of the digestive system: Secondary | ICD-10-CM | POA: Diagnosis not present

## 2022-04-09 DIAGNOSIS — R1032 Left lower quadrant pain: Secondary | ICD-10-CM | POA: Diagnosis not present

## 2022-04-09 DIAGNOSIS — R109 Unspecified abdominal pain: Secondary | ICD-10-CM | POA: Diagnosis not present

## 2022-04-09 DIAGNOSIS — R1030 Lower abdominal pain, unspecified: Secondary | ICD-10-CM | POA: Diagnosis not present

## 2022-04-09 DIAGNOSIS — R6889 Other general symptoms and signs: Secondary | ICD-10-CM | POA: Diagnosis not present

## 2022-04-09 DIAGNOSIS — R399 Unspecified symptoms and signs involving the genitourinary system: Secondary | ICD-10-CM | POA: Diagnosis not present

## 2022-04-09 LAB — URINALYSIS, COMPLETE
Bilirubin, UA: NEGATIVE
Glucose, UA: NEGATIVE
Ketones, UA: NEGATIVE
Nitrite, UA: NEGATIVE
Specific Gravity, UA: >1.03 — ABNORMAL HIGH (ref 1.005–1.030)
Urobilinogen, Ur: 1 mg/dL (ref 0.2–1.0)
pH, UA: 5.5 (ref 5.0–7.5)

## 2022-04-09 LAB — MICROSCOPIC EXAMINATION
RBC, Urine: NONE SEEN /hpf (ref 0–2)
Renal Epithel, UA: NONE SEEN /hpf

## 2022-04-09 MED ORDER — IOHEXOL 300 MG/ML  SOLN
100.0000 mL | Freq: Once | INTRAMUSCULAR | Status: AC | PRN
  Administered 2022-04-09: 100 mL via INTRAVENOUS

## 2022-04-09 NOTE — Progress Notes (Signed)
Subjective:    Patient ID: Angela Curry, female    DOB: 1950/08/13, 71 y.o.   MRN: [id number]  Chief Complaint  Patient presents with   Pelvic Pain    Pain pressure, pain when urinate, pain in lower back. 8/27 + for covid, passed out on 08/29   PT presents to the office today with pelvic pain that start 04/04/22. Having pain when she walks. No pain with urination or BM.    She took Keflex on 03/29/22 for UTI symptoms and states those symptoms resolved. However, urine culture was negative.   Reports the pain is improving. Has hx of diverticulitis. Pelvic Pain The patient's primary symptoms include pelvic pain. Associated symptoms include abdominal pain.  Abdominal Pain This is a new problem. The current episode started 1 to 4 weeks ago. The onset quality is gradual. The pain is located in the RUQ and LUQ. The pain is at a severity of 8/10. The pain is mild. The quality of the pain is aching. The abdominal pain does not radiate. The pain is aggravated by movement. The pain is relieved by Being still. She has tried acetaminophen for the symptoms. The treatment provided mild relief.      Review of Systems  Gastrointestinal:  Positive for abdominal pain.  Genitourinary:  Positive for pelvic pain.  All other systems reviewed and are negative.      Objective:   Physical Exam Vitals reviewed.  Constitutional:      General: She is not in acute distress.    Appearance: She is well-developed. She is obese.  HENT:     Head: Normocephalic and atraumatic.  Eyes:     Pupils: Pupils are equal, round, and reactive to light.  Neck:     Thyroid: No thyromegaly.  Cardiovascular:     Rate and Rhythm: Normal rate and regular rhythm.     Heart sounds: Normal heart sounds. No murmur heard. Pulmonary:     Effort: Pulmonary effort is normal. No respiratory distress.     Breath sounds: Normal breath sounds. No wheezing.  Abdominal:     General: Bowel sounds are normal. There is no  distension.     Palpations: Abdomen is soft.     Tenderness: There is abdominal tenderness (mild LLQ).  Musculoskeletal:        General: No tenderness. Normal range of motion.     Cervical back: Normal range of motion and neck supple.  Skin:    General: Skin is warm and dry.  Neurological:     Mental Status: She is alert and oriented to person, place, and time.     Cranial Nerves: No cranial nerve deficit.     Deep Tendon Reflexes: Reflexes are normal and symmetric.  Psychiatric:        Behavior: Behavior normal.        Thought Content: Thought content normal.        Judgment: Judgment normal.     BP 133/76   Pulse 86   Temp 97.8 F (36.6 C) (Temporal)   Ht '5\' 2"'$  (1.575 m)   Wt 217 lb (98.4 kg)   SpO2 97%   BMI 39.69 kg/m        Assessment & Plan:  Angela Curry comes in today with chief complaint of Pelvic Pain (Pain pressure, pain when urinate, pain in lower back. 8/27 + for covid, passed out on 08/29)   Diagnosis and orders addressed:  1. Lower abdominal pain - Urinalysis, Complete -  Urine Culture - CBC with Differential/Platelet  2. Left lower quadrant abdominal pain - CT Abdomen Pelvis W Contrast; Future - CBC with Differential/Platelet   3. H/O diverticulitis of colon  Labs pending  Will order CT scan since pain on and off for 2 weeks.  Urine has mild bacteria, but not bad enough infection to cause pain  [name], FNP

## 2022-04-09 NOTE — Patient Instructions (Signed)

## 2022-04-10 LAB — CBC WITH DIFFERENTIAL/PLATELET
Basophils Absolute: 0 10*3/uL (ref 0.0–0.2)
Basos: 0 %
EOS (ABSOLUTE): 0.2 10*3/uL (ref 0.0–0.4)
Eos: 3 %
Hematocrit: 37.5 % (ref 34.0–46.6)
Hemoglobin: 12.5 g/dL (ref 11.1–15.9)
Immature Grans (Abs): 0 10*3/uL (ref 0.0–0.1)
Immature Granulocytes: 0 %
Lymphocytes Absolute: 1.3 10*3/uL (ref 0.7–3.1)
Lymphs: 27 %
MCH: 28 pg (ref 26.6–33.0)
MCHC: 33.3 g/dL (ref 31.5–35.7)
MCV: 84 fL (ref 79–97)
Monocytes Absolute: 0.6 10*3/uL (ref 0.1–0.9)
Monocytes: 12 %
Neutrophils Absolute: 2.9 10*3/uL (ref 1.4–7.0)
Neutrophils: 58 %
Platelets: 287 10*3/uL (ref 150–450)
RBC: 4.47 x10E6/uL (ref 3.77–5.28)
RDW: 12.8 % (ref 11.7–15.4)
WBC: 5 10*3/uL (ref 3.4–10.8)

## 2022-04-11 ENCOUNTER — Other Ambulatory Visit: Payer: Self-pay | Admitting: Family Medicine

## 2022-04-11 ENCOUNTER — Other Ambulatory Visit: Payer: Self-pay | Admitting: Family

## 2022-04-11 LAB — URINE CULTURE

## 2022-04-11 MED ORDER — METRONIDAZOLE 500 MG PO TABS: 500.0000 mg | ORAL_TABLET | Freq: Three times a day (TID) | ORAL | 0 refills | Status: DC

## 2022-04-11 MED ORDER — CIPROFLOXACIN HCL 500 MG PO TABS: 500.0000 mg | ORAL_TABLET | Freq: Two times a day (BID) | ORAL | 0 refills | Status: DC

## 2022-04-16 ENCOUNTER — Emergency Department (HOSPITAL_BASED_OUTPATIENT_CLINIC_OR_DEPARTMENT_OTHER): Payer: BC Managed Care – PPO

## 2022-04-16 ENCOUNTER — Encounter (HOSPITAL_BASED_OUTPATIENT_CLINIC_OR_DEPARTMENT_OTHER): Payer: Self-pay

## 2022-04-16 ENCOUNTER — Emergency Department (HOSPITAL_BASED_OUTPATIENT_CLINIC_OR_DEPARTMENT_OTHER)
Admission: EM | Admit: 2022-04-16 | Discharge: 2022-04-16 | Disposition: A | Discharge status: Home / Self Care | Payer: BC Managed Care – PPO | Attending: Emergency Medicine | Admitting: Emergency Medicine

## 2022-04-16 DIAGNOSIS — I700 Atherosclerosis of aorta: Secondary | ICD-10-CM | POA: Diagnosis not present

## 2022-04-16 DIAGNOSIS — K5732 Diverticulitis of large intestine without perforation or abscess without bleeding: Secondary | ICD-10-CM | POA: Insufficient documentation

## 2022-04-16 DIAGNOSIS — R1032 Left lower quadrant pain: Secondary | ICD-10-CM | POA: Diagnosis not present

## 2022-04-16 DIAGNOSIS — R109 Unspecified abdominal pain: Secondary | ICD-10-CM | POA: Diagnosis not present

## 2022-04-16 LAB — CBC
HCT: 40.6 % (ref 36.0–46.0)
Hemoglobin: 13 g/dL (ref 12.0–15.0)
MCH: 27.3 pg (ref 26.0–34.0)
MCHC: 32 g/dL (ref 30.0–36.0)
MCV: 85.1 fL (ref 80.0–100.0)
Platelets: 309 10*3/uL (ref 150–400)
RBC: 4.77 MIL/uL (ref 3.87–5.11)
RDW: 13.4 % (ref 11.5–15.5)
WBC: 7.4 10*3/uL (ref 4.0–10.5)
nRBC: 0 % (ref 0.0–0.2)

## 2022-04-16 LAB — URINALYSIS, ROUTINE W REFLEX MICROSCOPIC
Bilirubin Urine: NEGATIVE
Glucose, UA: NEGATIVE mg/dL
Hgb urine dipstick: NEGATIVE
Nitrite: NEGATIVE
Protein, ur: NEGATIVE mg/dL
Specific Gravity, Urine: 1.011 (ref 1.005–1.030)
pH: 5.5 (ref 5.0–8.0)

## 2022-04-16 LAB — COMPREHENSIVE METABOLIC PANEL
ALT: 49 U/L — ABNORMAL HIGH (ref 0–44)
AST: 57 U/L — ABNORMAL HIGH (ref 15–41)
Albumin: 4.2 g/dL (ref 3.5–5.0)
Alkaline Phosphatase: 81 U/L (ref 38–126)
Anion gap: 7 (ref 5–15)
BUN: 8 mg/dL (ref 8–23)
CO2: 27 mmol/L (ref 22–32)
Calcium: 9.5 mg/dL (ref 8.9–10.3)
Chloride: 104 mmol/L (ref 98–111)
Creatinine, Ser: 0.7 mg/dL (ref 0.44–1.00)
GFR, Estimated: >60 mL/min (ref >60)
Glucose, Bld: 113 mg/dL — ABNORMAL HIGH (ref 70–99)
Potassium: 3.8 mmol/L (ref 3.5–5.1)
Sodium: 138 mmol/L (ref 135–145)
Total Bilirubin: 0.4 mg/dL (ref 0.3–1.2)
Total Protein: 6.8 g/dL (ref 6.5–8.1)

## 2022-04-16 LAB — LIPASE, BLOOD: Lipase: 21 U/L (ref 11–51)

## 2022-04-16 MED ORDER — ONDANSETRON HCL 4 MG PO TABS: 4.0000 mg | ORAL_TABLET | Freq: Three times a day (TID) | ORAL | 0 refills | Status: DC | PRN

## 2022-04-16 MED ORDER — CIPROFLOXACIN HCL 500 MG PO TABS: 500.0000 mg | ORAL_TABLET | Freq: Two times a day (BID) | ORAL | 0 refills | Status: DC

## 2022-04-16 MED ORDER — MORPHINE SULFATE (PF) 4 MG/ML IV SOLN: 4.0000 mg | Freq: Once | INTRAVENOUS | Status: DC

## 2022-04-16 MED ORDER — METRONIDAZOLE 500 MG PO TABS: 500.0000 mg | ORAL_TABLET | Freq: Two times a day (BID) | ORAL | 0 refills | Status: AC

## 2022-04-16 MED ORDER — IOHEXOL 300 MG/ML  SOLN
80.0000 mL | Freq: Once | INTRAMUSCULAR | Status: AC | PRN
  Administered 2022-04-16: 80 mL via INTRAVENOUS

## 2022-04-16 NOTE — ED Triage Notes (Signed)
Pt thought she had a UTI and was put on abx. Pt was sent for CT scan, told she had diverticulitis and put on 2 more abx. Pt endorses nausea on these abx.  Pt state LLQ pain worsening today.  Pt states she usually deals with constipation, but her stools have had more yellow to them than normal.

## 2022-04-16 NOTE — ED Provider Notes (Signed)
[facility] Provider Note   CSN: [id number] Arrival date & time: 04/16/22  1439     History  Chief Complaint  Patient presents with   Abdominal Pain    Angela Curry is a 71 y.o. female.  The history is provided by the patient and medical records. No language interpreter was used.  Abdominal Pain    71 year old female significant history of diverticulosis, obesity, who presents with complaint of abdominal pain.  Patient report 3 weeks ago she developed pain to her lower abdomen.  A week later she discussed with her doctor who request patient to send in a urine sample.  Initially patient was diagnosed with a UTI and was given antibiotic however later on urine culture came back negative and patient stopped antibiotic.  She still endorse ongoing lower abdominal pain and was seen by her doctor a week ago for symptom.  A CT scan of her abdomen and pelvis was obtained and although patient does not have any evidence of diverticulitis, given worsening pain she was given 2 antibiotics, Cipro and Flagyl.  She have been taking the medication as prescribed but states it makes her very nauseous while she takes the Cipro.  She continues to endorse having pain but worsening left lower quadrant pain since yesterday thus prompting this ER visit.  She does not endorse any fever or chills no dysuria no blood in the stool and no mucus in her stool.  No chest pain or shortness of breath no productive cough.  History of diverticulosis.  Has had endoscopy and colonoscopy several years ago which was unremarkable.  Patient does have a hiatal hernia.  Home Medications Prior to Admission medications   Medication Sig Start Date End Date Taking? Authorizing Provider  ciprofloxacin (CIPRO) 500 MG tablet Take 1 tablet (500 mg total) by mouth 2 (two) times daily. 04/11/22   [name] A, FNP  clobetasol ointment (TEMOVATE) 0.05 % Apply topically as needed. 10/17/21   Sharion Balloon,  FNP  Dexlansoprazole 30 MG capsule DR TAKE 1 CAPSULE BY MOUTH EVERY DAY 11/07/21   [name] A, FNP  fluocinolone (SYNALAR) 0.01 % external solution Apply topically daily as needed. 07/11/20   [provider]  fluticasone (FLONASE) 50 MCG/ACT nasal spray Place 2 sprays into both nostrils daily. 11/02/20   [name] A, FNP  Fluticasone-Salmeterol (ADVAIR DISKUS) 100-50 MCG/DOSE AEPB Inhale 1 puff into the lungs in the morning and at bedtime. Patient taking differently: Inhale 1 puff into the lungs 2 (two) times daily as needed. 02/03/20 03/27/21  Sharion Balloon, FNP  ketoconazole (NIZORAL) 2 % shampoo every 14 (fourteen) days. 07/11/20   [provider]  linaclotide Rolan Lipa) 145 MCG CAPS capsule Take 1 capsule (145 mcg total) by mouth daily before breakfast. 10/17/21   Sharion Balloon, FNP  meloxicam (MOBIC) 15 MG tablet Take 1 tablet (15 mg total) by mouth daily. 10/17/21   Sharion Balloon, FNP  metroNIDAZOLE (FLAGYL) 500 MG tablet Take 1 tablet (500 mg total) by mouth 3 (three) times daily for 7 days. 04/11/22 04/18/22  [name] A, FNP  metroNIDAZOLE (METROCREAM) 0.75 % cream Apply topically daily. 07/11/20   [provider]  valACYclovir (VALTREX) 1000 MG tablet TAKE 1 TABLET(1000 MG) BY MOUTH TWICE DAILY Patient taking differently: as needed. 08/18/20   Sharion Balloon, FNP      Allergies    Amoxicillin    Review of Systems   Review of Systems  Gastrointestinal:  Positive for abdominal pain.  All other systems reviewed and are negative.   Physical Exam Updated Vital Signs BP (!) 161/87 (BP Location: Right Arm)   Pulse 80   Temp 98.3 F (36.8 C) (Oral)   Resp 16   Ht '5\' 2"'$  (1.575 m)   Wt 97.1 kg   SpO2 100%   BMI 39.14 kg/m  Physical Exam Vitals and nursing note reviewed.  Constitutional:      General: She is not in acute distress.    Appearance: She is well-developed.  HENT:     Head: Atraumatic.  Eyes:     Conjunctiva/sclera:  Conjunctivae normal.  Cardiovascular:     Rate and Rhythm: Normal rate and regular rhythm.  Pulmonary:     Effort: Pulmonary effort is normal.  Abdominal:     Palpations: Abdomen is soft.     Tenderness: There is abdominal tenderness (Tenderness to left lower quadrant with guarding but no rebound tenderness.).  Musculoskeletal:     Cervical back: Neck supple.  Skin:    Findings: No rash.  Neurological:     Mental Status: She is alert.  Psychiatric:        Mood and Affect: Mood normal.     ED Results / Procedures / Treatments   Labs (all labs ordered are listed, but only abnormal results are displayed) Labs Reviewed  COMPREHENSIVE METABOLIC PANEL - Abnormal; Notable for the following components:      Result Value   Glucose, Bld 113 (*)    AST 57 (*)    ALT 49 (*)    All other components within normal limits  URINALYSIS, ROUTINE W REFLEX MICROSCOPIC - Abnormal; Notable for the following components:   Ketones, ur TRACE (*)    Leukocytes,Ua MODERATE (*)    Bacteria, UA FEW (*)    All other components within normal limits  LIPASE, BLOOD  CBC    EKG None  Radiology CT ABDOMEN PELVIS W CONTRAST  Result Date: 04/16/2022 CLINICAL DATA:  Left lower quadrant abdominal pain. EXAM: CT ABDOMEN AND PELVIS WITH CONTRAST TECHNIQUE: Multidetector CT imaging of the abdomen and pelvis was performed using the standard protocol following bolus administration of intravenous contrast. RADIATION DOSE REDUCTION: This exam was performed according to the departmental dose-optimization program which includes automated exposure control, adjustment of the mA and/or kV according to patient size and/or use of iterative reconstruction technique. CONTRAST:  108m OMNIPAQUE IOHEXOL 300 MG/ML  SOLN COMPARISON:  April 09, 2022 FINDINGS: Lower chest: Moderate size sliding-type hiatal hernia is noted. Visualized lung bases are unremarkable. Hepatobiliary: Cholelithiasis. No biliary dilatation. Liver is  unremarkable. Pancreas: Unremarkable. No pancreatic ductal dilatation or surrounding inflammatory changes. Spleen: Normal in size without focal abnormality. Adrenals/Urinary Tract: Adrenal glands are unremarkable. Kidneys are normal, without renal calculi, focal lesion, or hydronephrosis. Bladder is unremarkable. Stomach/Bowel: Stomach appears normal. The appendix is unremarkable. There is no evidence of bowel obstruction. Focal diverticulitis is seen involving the proximal sigmoid colon. No definite abscess formation is noted. Vascular/Lymphatic: Aortic atherosclerosis. No enlarged abdominal or pelvic lymph nodes. Reproductive: Uterus and bilateral adnexa are unremarkable. Other: No abdominal wall hernia or abnormality. No abdominopelvic ascites. Musculoskeletal: No acute or significant osseous findings. IMPRESSION: Focal diverticulitis is seen involving the proximal sigmoid colon. No definite abscess formation is noted. Moderate size sliding-type hiatal hernia. Cholelithiasis. Aortic Atherosclerosis (ICD10-I70.0). Electronically Signed   By: JMarijo ConceptionM.D.   On: 04/16/2022 17:41    Procedures Procedures    Medications Ordered in  ED Medications  morphine (PF) 4 MG/ML injection 4 mg (4 mg Intravenous Not Given 04/16/22 1719)  iohexol (OMNIPAQUE) 300 MG/ML solution 80 mL (80 mLs Intravenous Contrast Given 04/16/22 1723)    ED Course/ Medical Decision Making/ A&P                           Medical Decision Making Amount and/or Complexity of Data Reviewed Labs: ordered.   BP (!) 161/87 (BP Location: Right Arm)   Pulse 80   Temp 98.3 F (36.8 C) (Oral)   Resp 16   Ht '5\' 2"'$  (1.575 m)   Wt 97.1 kg   SpO2 100%   BMI 39.14 kg/m   32:10 PM 71 year old female here with complaint of abdominal pain.  She endorsed recurrent pain across her lower pannus for the past 3 weeks, and have been on antibiotics for potential UTI in the other antibiotic for suspected diverticulitis.  She is taking Cipro  and Flagyl for nearly a week but no improvement.  She endorsed worsening left lower quadrant pain last night prompting this ER visit.  She does not endorse fever or chills.  She does not endorse any blood in her stool or mucus in her stool.  On exam, heart lung sounds normal patient does have reproducible left lower quadrant tenderness with guarding.  She is afebrile, blood pressure is elevated at 161/87.  Labs obtained independently viewed interpreted by me.  Urinalysis shows moderate leukocyte but few bacteria and nitrite negative.  Normal lipase.  Electrolyte panels are reassuring.  Minimally elevated transaminitis with AST 57, ALT 49.  Normal WBC, normal H&H.  Given the location of her discomfort, I will repeat another abdominal pelvic CT scan to assess for potential diverticulitis and to rule out potential complication such as microtears or abscess.  Patient given opiate pain medication for symptom control.  5:52 PM CT scan of the abdomen pelvis was obtained independently viewed and interpreted by me and I agree with radiologist interpretation.  There is a focal diverticulitis seen at the proximal sigmoid colon but no definitive abscess formation is noted.    This finding is consistent with patient's presentation.  Fortunately with the reassuring labs, no evidence of perforation or abscess, I felt patient can be discharged home with continued antibiotic.  She endorsed the does not tolerate cipro/Flagyl.  She is  allergic to amoxicillin with rash to face.  After further discussion, patient agrees to take Cipro and Flagyl and I will also prescribe Zofran to use as needed for nausea.  Recommend take it with food for better tolerance.  Avoid alcohol use.  I also gave patient strict return precaution.  Patient voiced understanding and agrees with plan.  I have also considered admission but I felt patient being inadequately treated for her infection instead of failing her treatment  This patient presents  to the ED for concern of abd pain, this involves an extensive number of treatment options, and is a complaint that carries with it a high risk of complications and morbidity.  The differential diagnosis includes diverticulitis, colitis, appendicitis, pancreatitis, UTI  Co morbidities that complicate the patient evaluation chronic constipation  diverticulosis Additional history obtained:  Additional history obtained from daughter External records from outside source obtained and reviewed including EMR  Lab Tests:  I Ordered, and personally interpreted labs.  The pertinent results include:  as above  Imaging Studies ordered:  I ordered imaging studies including abd/pelvis CT I independently  visualized and interpreted imaging which showed diverticulitis I agree with the radiologist interpretation  Cardiac Monitoring:  The patient was maintained on a cardiac monitor.  I personally viewed and interpreted the cardiac monitored which showed an underlying rhythm of: NSR  Medicines ordered and prescription drug management:  I ordered medication including morphine  for abd pain Reevaluation of the patient after these medicines showed that the patient  Pt refused the medication I have reviewed the patients home medicines and have made adjustments as needed  Test Considered: as above  Critical Interventions: none   Problem List / ED Course: diverticulitis  Reevaluation:  After the interventions noted above, I reevaluated the patient and found that they have :stayed the same  Social Determinants of Health: none  Dispostion:  After consideration of the diagnostic results and the patients response to treatment, I feel that the patent would benefit from outpt f/u.         Final Clinical Impression(s) / ED Diagnoses Final diagnoses:  Sigmoid diverticulitis    Rx / DC Orders ED Discharge Orders          Ordered    ciprofloxacin (CIPRO) 500 MG tablet  2 times daily         04/16/22 1806    metroNIDAZOLE (FLAGYL) 500 MG tablet  2 times daily        04/16/22 1806    ondansetron (ZOFRAN) 4 MG tablet  Every 8 hours PRN        04/16/22 1806              Domenic Moras, PA-C 04/16/22 1807    Malvin Johns, MD 04/16/22 2245

## 2022-04-16 NOTE — Discharge Instructions (Signed)
You have been diagnosed with sigmoid diverticulitis.  Please continue to take your antibiotic for total of 10 to 14 days.  Take with food to decrease stomach upset.  You may also take Zofran as needed for nausea.  If you develop worsening abdominal pain, fever, or if you have any concern, do not hesitate to return for further assessment.

## 2022-04-16 NOTE — ED Notes (Signed)
Reviewed AVS/discharge instruction with patient. Time allotted for and all questions answered. Patient is agreeable for d/c and escorted to ed exit by staff.  

## 2022-04-24 NOTE — Telephone Encounter (Signed)
Patient seen [employer]

## 2022-05-08 ENCOUNTER — Other Ambulatory Visit: Payer: Self-pay | Admitting: Family

## 2022-05-08 DIAGNOSIS — K5909 Other constipation: Secondary | ICD-10-CM

## 2022-07-01 ENCOUNTER — Ambulatory Visit (INDEPENDENT_AMBULATORY_CARE_PROVIDER_SITE_OTHER): Payer: BC Managed Care – PPO | Admitting: Family

## 2022-07-01 ENCOUNTER — Encounter: Payer: Self-pay | Admitting: Family

## 2022-07-01 DIAGNOSIS — J4521 Mild intermittent asthma with (acute) exacerbation: Secondary | ICD-10-CM | POA: Diagnosis not present

## 2022-07-01 MED ORDER — PROMETHAZINE-DM 6.25-15 MG/5ML PO SYRP: 5.0000 mL | ORAL_SOLUTION | Freq: Three times a day (TID) | ORAL | 0 refills | Status: DC | PRN

## 2022-07-01 MED ORDER — PREDNISONE 20 MG PO TABS: 40.0000 mg | ORAL_TABLET | Freq: Every day | ORAL | 0 refills | Status: AC

## 2022-07-01 NOTE — Progress Notes (Signed)
Virtual Visit  Note Due to COVID-19 pandemic this visit was conducted virtually. This visit type was conducted due to national recommendations for restrictions regarding the COVID-19 Pandemic (e.g. social distancing, sheltering in place) in an effort to limit this patient's exposure and mitigate transmission in our community. All issues noted in this document were discussed and addressed.  A physical exam was not performed with this format.  I connected with Angela Curry on 07/01/22 at 12:37 pm by telephone and verified that I am speaking with the correct person using two identifiers. Angela Curry is currently located at home and no one is currently with her during visit. The provider, [name], FNP is located in their office at time of visit.  I discussed the limitations, risks, security and privacy concerns of performing an evaluation and management service by telephone and the availability of in person appointments. I also discussed with the patient that there may be a patient responsible charge related to this service. The patient expressed understanding and agreed to proceed.  Ms. walburga, Curry are scheduled for a virtual visit with your provider today.    Just as we do with appointments in the office, we must obtain your consent to participate.  Your consent will be active for this visit and any virtual visit you may have with one of our providers in the next 365 days.    If you have a MyChart account, I can also send a copy of this consent to you electronically.  All virtual visits are billed to your insurance company just like a traditional visit in the office.  As this is a virtual visit, video technology does not allow for your provider to perform a traditional examination.  This may limit your provider's ability to fully assess your condition.  If your provider identifies any concerns that need to be evaluated in person or the need to arrange testing such as labs, EKG,  etc, we will make arrangements to do so.    Although advances in technology are sophisticated, we cannot ensure that it will always work on either your end or our end.  If the connection with a video visit is poor, we may have to switch to a telephone visit.  With either a video or telephone visit, we are not always able to ensure that we have a secure connection.   I need to obtain your verbal consent now.   Are you willing to proceed with your visit today?   [name] has provided verbal consent on 07/01/2022 for a virtual visit (video or telephone).   [name], [place] 07/01/2022  12:40 PM   History and Present Illness:  Pt calls the office today with cough and congestion. States it started last week with URI symptoms.  Cough This is a new problem. The current episode started 1 to 4 weeks ago. The problem has been waxing and waning. The problem occurs every few minutes. Associated symptoms include ear congestion, nasal congestion, postnasal drip, a sore throat, shortness of breath and wheezing. Pertinent negatives include no ear pain, fever, headaches or myalgias. Associated symptoms comments: Chest tightness. She has tried rest and OTC cough suppressant for the symptoms.     Review of Systems  Constitutional:  Negative for fever.  HENT:  Positive for postnasal drip and sore throat. Negative for ear pain.   Respiratory:  Positive for cough, shortness of breath and wheezing.   Musculoskeletal:  Negative for myalgias.  Neurological:  Negative for  headaches.     Observations/Objective: No SOB or distress noted   Assessment and Plan: 1. Mild intermittent asthma with exacerbation Rest Force fluids Mucinex  Follow up if symptoms worsen or do not improve  - predniSONE (DELTASONE) 20 MG tablet; Take 2 tablets (40 mg total) by mouth daily with breakfast for 5 days.  Dispense: 10 tablet; Refill: 0 - promethazine-dextromethorphan (PROMETHAZINE-DM) 6.25-15 MG/5ML syrup; Take  5 mLs by mouth 3 (three) times daily as needed for cough.  Dispense: 118 mL; Refill: 0      I discussed the assessment and treatment plan with the patient. The patient was provided an opportunity to ask questions and all were answered. The patient agreed with the plan and demonstrated an understanding of the instructions.   The patient was advised to call back or seek an in-person evaluation if the symptoms worsen or if the condition fails to improve as anticipated.  The above assessment and management plan was discussed with the patient. The patient verbalized understanding of and has agreed to the management plan. Patient is aware to call the clinic if symptoms persist or worsen. Patient is aware when to return to the clinic for a follow-up visit. Patient educated on when it is appropriate to go to the emergency department.   Time call ended:  11:49 pm   I provided 12 minutes of  non face-to-face time during this encounter.    [name], FNP

## 2022-07-01 NOTE — Patient Instructions (Signed)
Asthma, Adult ? ?Asthma is a long-term (chronic) condition that causes recurrent episodes in which the lower airways in the lungs become tight and narrow. The narrowing is caused by inflammation and tightening of the smooth muscle around the lower airways. ?Asthma episodes, also called asthma attacks or asthma flares, may cause coughing, making high-pitched whistling sounds when you breathe, most often when you breathe out (wheezing), shortness of breath, and chest pain. The airways may produce extra mucus caused by the inflammation and irritation. During an attack, it can be difficult to breathe. Asthma attacks can range from minor to life-threatening. ?Asthma cannot be cured, but medicines and lifestyle changes can help control it and treat acute attacks. It is important to keep your asthma well controlled so the condition does not interfere with your daily life. ?What are the causes? ?This condition is believed to be caused by inherited (genetic) and environmental factors, but its exact cause is not known. ?What can trigger an asthma attack? ?Many things can bring on an asthma attack or make symptoms worse. These triggers are different for every person. Common triggers include: ?Allergens and irritants like mold, dust, pet dander, cockroaches, pollen, air pollution, and chemical odors. ?Cigarette smoke. ?Weather changes and cold air. ?Stress and strong emotional responses such as crying or laughing hard. ?Certain medications such as aspirin or beta blockers. ?Infections and inflammatory conditions, such as the flu, a cold, pneumonia, or inflammation of the nasal membranes (rhinitis). ?Gastroesophageal reflux disease (GERD). ?What are the signs or symptoms? ?Symptoms may occur right after exposure to an asthma trigger or hours later and can vary by person. Common signs and symptoms include: ?Wheezing. ?Trouble breathing (shortness of breath). ?Excessive nighttime or early morning coughing. ?Chest  tightness. ?Tiredness (fatigue) with minimal activity. ?Difficulty talking in complete sentences. ?Poor exercise tolerance. ?How is this diagnosed? ?This condition is diagnosed based on: ?A physical exam and your medical history. ?Tests, which may include: ?Lung function studies to evaluate the flow of air in your lungs. ?Allergy tests. ?Imaging tests, such as X-rays. ?How is this treated? ?There is no cure, but symptoms can be controlled with proper treatment. Treatment usually involves: ?Identifying and avoiding your asthma triggers. ?Inhaled medicines. Two types are commonly used to treat asthma, depending on severity: ?Controller medicines. These help prevent asthma symptoms from occurring. They are taken every day. ?Fast-acting reliever or rescue medicines. These quickly relieve asthma symptoms. They are used as needed and provide short-term relief. ?Using other medicines, such as: ?Allergy medicines, such as antihistamines, if your asthma attacks are triggered by allergens. ?Immune medicines (immunomodulators). These are medicines that help control the immune system. ?Using supplemental oxygen. This is only needed during a severe episode. ?Creating an asthma action plan. An asthma action plan is a written plan for managing and treating your asthma attacks. This plan includes: ?A list of your asthma triggers and how to avoid them. ?Information about when medicines should be taken and when their dosage should be changed. ?Instructions about using a device called a peak flow meter. A peak flow meter measures how well the lungs are working and the severity of your asthma. It helps you monitor your condition. ?Follow these instructions at home: ?Take over-the-counter and prescription medicines only as told by your health care provider. ?Stay up to date on all vaccinations as recommended by your healthcare provider, including vaccines for the flu and pneumonia. ?Use a peak flow meter and keep track of your peak flow  readings. ?Understand and use your asthma   action plan to address any asthma flares. ?Do not smoke or allow anyone to smoke in your home. ?Contact a health care provider if: ?You have wheezing, shortness of breath, or a cough that is not responding to medicines. ?Your medicines are causing side effects, such as a rash, itching, swelling, or trouble breathing. ?You need to use a reliever medicine more than 2-3 times a week. ?Your peak flow reading is still at 50-79% of your personal best after following your action plan for 1 hour. ?You have a fever and shortness of breath. ?Get help right away if: ?You are getting worse and do not respond to treatment during an asthma attack. ?You are short of breath when at rest or when doing very little physical activity. ?You have difficulty eating, drinking, or talking. ?You have chest pain or tightness. ?You develop a fast heartbeat or palpitations. ?You have a bluish color to your lips or fingernails. ?You are light-headed or dizzy, or you faint. ?Your peak flow reading is less than 50% of your personal best. ?You feel too tired to breathe normally. ?These symptoms may be an emergency. Get help right away. Call 911. ?Do not wait to see if the symptoms will go away. ?Do not drive yourself to the hospital. ?Summary ?Asthma is a long-term (chronic) condition that causes recurrent episodes in which the airways become tight and narrow. Asthma episodes, also called asthma attacks or asthma flares, can cause coughing, wheezing, shortness of breath, and chest pain. ?Asthma cannot be cured, but medicines and lifestyle changes can help keep it well controlled and prevent asthma flares. ?Make sure you understand how to avoid triggers and how and when to use your medicines. ?Asthma attacks can range from minor to life-threatening. Get help right away if you have an asthma attack and do not respond to treatment with your usual rescue medicines. ?This information is not intended to replace  advice given to you by your health care provider. Make sure you discuss any questions you have with your health care provider. ?Document Revised: 04/18/2021 Document Reviewed: 04/09/2021 ?Elsevier Patient Education ? 2023 [employer] ? ?

## 2022-07-05 DIAGNOSIS — Q828 Other specified congenital malformations of skin: Secondary | ICD-10-CM | POA: Diagnosis not present

## 2022-07-05 DIAGNOSIS — L409 Psoriasis, unspecified: Secondary | ICD-10-CM | POA: Diagnosis not present

## 2022-08-06 ENCOUNTER — Other Ambulatory Visit: Payer: Self-pay | Admitting: Family

## 2022-08-06 DIAGNOSIS — K5909 Other constipation: Secondary | ICD-10-CM

## 2022-08-07 NOTE — Progress Notes (Unsigned)
[facility] [place] Phone: [phone] Subjective:   [name], am serving as a scribe for Dr. Hulan Saas.  I'm seeing this patient by the request  of:  Sharion Balloon, FNP  CC: Right shoulder pain  [id number]  Angela Curry is a 72 y.o. female coming in with complaint of right shoulder pain. Patient states that she had pain on top of shoulder joint that radiates into the upper arm and sometimes into her hand. Works as a Secretary/administrator. Unable to lie on R side. Using heat and ice. Trying to making ergonomic modifications at night when she is using her iPad. No hx of shoulder injury.        Past Medical History:  Diagnosis Date   Allergy    ragweed   Asthma    "light asthma after pneumonia about 15 years ago"   Dysphagia    Fibrocystic breast disease    GERD (gastroesophageal reflux disease)    H/O cold sores    Hiatal hernia    Osteopenia    Psoriasis    Vitamin D deficiency    Past Surgical History:  Procedure Laterality Date   COLONOSCOPY     TONSILLECTOMY     TUBAL LIGATION     UPPER GASTROINTESTINAL ENDOSCOPY     Social History   Socioeconomic History   Marital status: Divorced    Spouse name: Not on file   Number of children: Not on file   Years of education: Not on file   Highest education level: Not on file  Occupational History   Not on file  Tobacco Use   Smoking status: Never   Smokeless tobacco: Never  Vaping Use   Vaping Use: Never used  Substance and Sexual Activity   Alcohol use: Yes    Comment: few days a week   Drug use: No   Sexual activity: Not on file  Other Topics Concern   Not on file  Social History Narrative   Not on file   Social Determinants of Health   Financial Resource Strain: Not on file  Food Insecurity: Not on file  Transportation Needs: Not on file  Physical Activity: Not on file  Stress: Not on file  Social Connections: Not on file    Allergies  Allergen Reactions   Amoxicillin Other (See Comments)   Family History  Problem Relation Age of Onset   Diabetes Mother    Heart disease Father 89   Drug abuse Father    Early death Father    Cancer Paternal Aunt        breast   Breast cancer Paternal Aunt    Cancer Paternal Aunt        breast   Breast cancer Paternal Aunt    Esophageal cancer Neg Hx    Colon cancer Neg Hx    Stomach cancer Neg Hx    Rectal cancer Neg Hx       Current Outpatient Medications (Respiratory):    fluticasone (FLONASE) 50 MCG/ACT nasal spray, Place 2 sprays into both nostrils daily.   Fluticasone-Salmeterol (ADVAIR DISKUS) 100-50 MCG/DOSE AEPB, Inhale 1 puff into the lungs in the morning and at bedtime. (Patient taking differently: Inhale 1 puff into the lungs 2 (two) times daily as needed.)   promethazine-dextromethorphan (PROMETHAZINE-DM) 6.25-15 MG/5ML syrup, Take 5 mLs by mouth 3 (three) times daily as needed for cough.  Current Outpatient Medications (Analgesics):  meloxicam (MOBIC) 15 MG tablet, Take 1 tablet (15 mg total) by mouth daily.   Current Outpatient Medications (Other):    clobetasol ointment (TEMOVATE) 0.05 %, Apply topically as needed.   Dexlansoprazole 30 MG capsule DR, TAKE 1 CAPSULE BY MOUTH EVERY DAY   fluocinolone (SYNALAR) 0.01 % external solution, Apply topically daily as needed.   ketoconazole (NIZORAL) 2 % shampoo, every 14 (fourteen) days.   LINZESS 145 MCG CAPS capsule, TAKE 1 CAPSULE BY MOUTH EVERY DAY BEFORE BREAKFAST   metroNIDAZOLE (METROCREAM) 0.75 % cream, Apply topically daily.   valACYclovir (VALTREX) 1000 MG tablet, TAKE 1 TABLET(1000 MG) BY MOUTH TWICE DAILY (Patient taking differently: as needed.)   ondansetron (ZOFRAN) 4 MG tablet, Take 1 tablet (4 mg total) by mouth every 8 (eight) hours as needed for nausea or vomiting.   Reviewed prior external information including notes and imaging from  primary care provider As well as notes that  were available from care everywhere and other healthcare systems.  Past medical history, social, surgical and family history all reviewed in electronic medical record.  No pertanent information unless stated regarding to the chief complaint.   Review of Systems:  No headache, visual changes, nausea, vomiting, diarrhea, constipation, dizziness, abdominal pain, skin rash, fevers, chills, night sweats, weight loss, swollen lymph nodes, body aches, joint swelling, chest pain, shortness of breath, mood changes. POSITIVE muscle aches  Objective  Blood pressure (!) 132/92, pulse (!) 123, height '5\' 2"'$  (1.575 m), weight 220 lb (99.8 kg), SpO2 98 %.   General: No apparent distress alert and oriented x3 mood and affect normal, dressed appropriately.  HEENT: Pupils equal, extraocular movements intact  Respiratory: Patient's speak in full sentences and does not appear short of breath  Cardiovascular: No lower extremity edema, non tender, no erythema  Right shoulder exam shows patient does have some limited range of motion.  Positive O'Brien's test noted, patient does have some limited external rotation.  5 out of 5 strength of the shoulder noted.  Patient does have some voluntary guarding noted.  Neck exam has some limited sidebending.  Limited muscular skeletal ultrasound was performed and interpreted by Hulan Saas, M  Limited ultrasound of patient's right shoulder does show that there is some arthritic changes noted with some hypoechoic changes of the glenohumeral joint.  Arthritic changes noted of the acromioclavicular joint but no significant hypoechoic changes noted.  Degenerative changes of the rotator cuff but no true acute tear appreciated Impression: Underlying arthritic changes with small effusion  Procedure: Real-time Ultrasound Guided Injection of right glenohumeral joint Device: GE Logiq Q7  Ultrasound guided injection is preferred based studies that show increased duration, increased  effect, greater accuracy, decreased procedural pain, increased response rate with ultrasound guided versus blind injection.  Verbal informed consent obtained.  Time-out conducted.  Noted no overlying erythema, induration, or other signs of local infection.  Skin prepped in a sterile fashion.  Local anesthesia: Topical Ethyl chloride.  With sterile technique and under real time ultrasound guidance:  Joint visualized.  23g 1  inch needle inserted posterior approach. Pictures taken for needle placement. Patient did have injection of 2 cc of 0.5% Marcaine, and 1.0 cc of Kenalog 40 mg/dL. Completed without difficulty  Pain immediately resolved suggesting accurate placement of the medication.  Advised to call if fevers/chills, erythema, induration, drainage, or persistent bleeding.  Impression: Technically successful ultrasound guided injection.  [id number]; 15 additional minutes spent for Therapeutic exercises as stated in above notes.  This included exercises  focusing on stretching, strengthening, with significant focus on eccentric aspects.   Long term goals include an improvement in range of motion, strength, endurance as well as avoiding reinjury. Patient's frequency would include in 1-2 times a day, 3-5 times a week for a duration of 6-12 weeks. Shoulder Exercises that included:  Basic scapular stabilization to include adduction and depression of scapula Scaption, focusing on proper movement and good control Internal and External rotation utilizing a theraband, with elbow tucked at side entire time Rows with theraband    Proper technique shown and discussed handout in great detail with ATC.  All questions were discussed and answered.     Impression and Recommendations:     The above documentation has been reviewed and is accurate and complete Lyndal Pulley, DO

## 2022-08-08 ENCOUNTER — Ambulatory Visit (INDEPENDENT_AMBULATORY_CARE_PROVIDER_SITE_OTHER): Payer: BC Managed Care – PPO | Admitting: Family Medicine

## 2022-08-08 ENCOUNTER — Ambulatory Visit (INDEPENDENT_AMBULATORY_CARE_PROVIDER_SITE_OTHER): Payer: BC Managed Care – PPO

## 2022-08-08 ENCOUNTER — Encounter: Payer: Self-pay | Admitting: Family Medicine

## 2022-08-08 ENCOUNTER — Ambulatory Visit: Payer: Self-pay

## 2022-08-08 VITALS — BP 132/92 | HR 123 | Ht 62.0 in | Wt 220.0 lb

## 2022-08-08 DIAGNOSIS — M25511 Pain in right shoulder: Secondary | ICD-10-CM

## 2022-08-08 NOTE — Assessment & Plan Note (Signed)
Patient given injection and tolerated the procedure well, discussed icing regimen and home exercises, work with Product/process development scientist.  I do feel that there is some underlying arthritic changes that are contributing to it.  Patient has significant improvement in range of motion and strength right after the injection.  Follow-up with me again in 6 to 8 weeks.

## 2022-08-08 NOTE — Patient Instructions (Signed)
Xray today Ice 56mn 2x a day Injection in shoulder Exercises 3x a week See me in 6-8 weeks

## 2022-09-10 ENCOUNTER — Encounter: Payer: Self-pay | Admitting: Family

## 2022-09-19 ENCOUNTER — Ambulatory Visit: Payer: BC Managed Care – PPO | Admitting: Family Medicine

## 2022-10-03 DIAGNOSIS — B353 Tinea pedis: Secondary | ICD-10-CM | POA: Diagnosis not present

## 2022-10-03 DIAGNOSIS — L409 Psoriasis, unspecified: Secondary | ICD-10-CM | POA: Diagnosis not present

## 2022-11-03 ENCOUNTER — Other Ambulatory Visit: Payer: Self-pay | Admitting: Family

## 2022-11-03 DIAGNOSIS — K219 Gastro-esophageal reflux disease without esophagitis: Secondary | ICD-10-CM

## 2022-11-03 DIAGNOSIS — K5909 Other constipation: Secondary | ICD-10-CM

## 2022-11-07 ENCOUNTER — Encounter: Payer: Self-pay | Admitting: Family

## 2022-11-07 ENCOUNTER — Ambulatory Visit: Payer: BC Managed Care – PPO | Admitting: Family

## 2022-11-07 VITALS — BP 135/87 | HR 67 | Temp 97.6°F | Ht 62.0 in | Wt 225.0 lb

## 2022-11-07 DIAGNOSIS — L409 Psoriasis, unspecified: Secondary | ICD-10-CM | POA: Diagnosis not present

## 2022-11-07 DIAGNOSIS — K5909 Other constipation: Secondary | ICD-10-CM

## 2022-11-07 DIAGNOSIS — R0602 Shortness of breath: Secondary | ICD-10-CM

## 2022-11-07 DIAGNOSIS — K219 Gastro-esophageal reflux disease without esophagitis: Secondary | ICD-10-CM

## 2022-11-07 DIAGNOSIS — M2550 Pain in unspecified joint: Secondary | ICD-10-CM

## 2022-11-07 DIAGNOSIS — E785 Hyperlipidemia, unspecified: Secondary | ICD-10-CM | POA: Diagnosis not present

## 2022-11-07 DIAGNOSIS — Z0001 Encounter for general adult medical examination with abnormal findings: Secondary | ICD-10-CM | POA: Diagnosis not present

## 2022-11-07 DIAGNOSIS — Z0000 Encounter for general adult medical examination without abnormal findings: Secondary | ICD-10-CM

## 2022-11-07 DIAGNOSIS — J4520 Mild intermittent asthma, uncomplicated: Secondary | ICD-10-CM

## 2022-11-07 DIAGNOSIS — E559 Vitamin D deficiency, unspecified: Secondary | ICD-10-CM | POA: Diagnosis not present

## 2022-11-07 DIAGNOSIS — Z6841 Body Mass Index (BMI) 40.0 and over, adult: Secondary | ICD-10-CM

## 2022-11-07 DIAGNOSIS — R5383 Other fatigue: Secondary | ICD-10-CM | POA: Diagnosis not present

## 2022-11-07 DIAGNOSIS — M8580 Other specified disorders of bone density and structure, unspecified site: Secondary | ICD-10-CM | POA: Diagnosis not present

## 2022-11-07 MED ORDER — LINACLOTIDE 145 MCG PO CAPS: 145.0000 ug | ORAL_CAPSULE | Freq: Every day | ORAL | 3 refills | Status: DC

## 2022-11-07 MED ORDER — DEXLANSOPRAZOLE 30 MG PO CPDR: DELAYED_RELEASE_CAPSULE | ORAL | 3 refills | Status: DC

## 2022-11-07 NOTE — Progress Notes (Signed)
Subjective:    Patient ID: Angela Curry, female    DOB: Apr 19, 1951, 72 y.o.   MRN: [id number]  Chief Complaint  Patient presents with   Medical Management of Chronic Issues   Pt presents to the office today for CPE and chronic follow up. Has morbid obese with a BMI of 41 and GERD and constipation.   She is followed by dermatologists every 6 months. She has had increased joint pain and was referred to Rheumatologists. That appointment 11/25/22.   She reports over the last 3-4 months she has fatigued and SOB. Reports she is getting SOB with exertion.   Since our last visit she saw ortho and had right shoulder injection.    Asthma She complains of cough, shortness of breath and wheezing. This is a chronic problem. The current episode started more than 1 year ago. The problem occurs intermittently. The problem has been waxing and waning. Associated symptoms include heartburn. She reports moderate improvement on treatment. Her past medical history is significant for asthma.  Gastroesophageal Reflux She complains of belching, coughing, heartburn and wheezing. This is a chronic problem. The current episode started more than 1 year ago. The problem occurs occasionally. She has tried a PPI for the symptoms. The treatment provided moderate relief.  Constipation This is a chronic problem. The current episode started more than 1 year ago. The problem has been waxing and waning since onset. Risk factors include obesity. She has tried laxatives for the symptoms. The treatment provided moderate relief.  Hyperlipidemia This is a chronic problem. The current episode started more than 1 year ago. The problem is uncontrolled. Recent lipid tests were reviewed and are high. Exacerbating diseases include obesity. Associated symptoms include shortness of breath. She is currently on no antihyperlipidemic treatment. The current treatment provides no improvement of lipids. Risk factors for coronary artery  disease include dyslipidemia, hypertension, a sedentary lifestyle and post-menopausal.      Review of Systems  Respiratory:  Positive for cough, shortness of breath and wheezing.   Gastrointestinal:  Positive for constipation and heartburn.  All other systems reviewed and are negative.   Family History  Problem Relation Age of Onset   Diabetes Mother    Heart disease Father 54   Drug abuse Father    Early death Father    Cancer Paternal Aunt        breast   Breast cancer Paternal Aunt    Cancer Paternal Aunt        breast   Breast cancer Paternal Aunt    Esophageal cancer Neg Hx    Colon cancer Neg Hx    Stomach cancer Neg Hx    Rectal cancer Neg Hx    Social History   Socioeconomic History   Marital status: Divorced    Spouse name: Not on file   Number of children: Not on file   Years of education: Not on file   Highest education level: Not on file  Occupational History   Not on file  Tobacco Use   Smoking status: Never   Smokeless tobacco: Never  Vaping Use   Vaping Use: Never used  Substance and Sexual Activity   Alcohol use: Yes    Comment: few days a week   Drug use: No   Sexual activity: Not on file  Other Topics Concern   Not on file  Social History Narrative   Not on file   Social Determinants of Health   Financial Resource Strain: Not  on file  Food Insecurity: Not on file  Transportation Needs: Not on file  Physical Activity: Not on file  Stress: Not on file  Social Connections: Not on file       Objective:   Physical Exam Vitals reviewed.  Constitutional:      General: She is not in acute distress.    Appearance: She is well-developed. She is obese.  HENT:     Head: Normocephalic and atraumatic.     Right Ear: Tympanic membrane normal.     Left Ear: Tympanic membrane normal.  Eyes:     Pupils: Pupils are equal, round, and reactive to light.  Neck:     Thyroid: No thyromegaly.  Cardiovascular:     Rate and Rhythm: Normal rate and  regular rhythm.     Heart sounds: Normal heart sounds. No murmur heard. Pulmonary:     Effort: Pulmonary effort is normal. No respiratory distress.     Breath sounds: Normal breath sounds. No wheezing.  Abdominal:     General: Bowel sounds are normal. There is no distension.     Palpations: Abdomen is soft.     Tenderness: There is no abdominal tenderness.  Musculoskeletal:        General: No tenderness. Normal range of motion.     Cervical back: Normal range of motion and neck supple.  Skin:    General: Skin is warm and dry.     Findings: Erythema and rash present.  Neurological:     Mental Status: She is alert and oriented to person, place, and time.     Cranial Nerves: No cranial nerve deficit.     Deep Tendon Reflexes: Reflexes are normal and symmetric.  Psychiatric:        Behavior: Behavior normal.        Thought Content: Thought content normal.        Judgment: Judgment normal.       BP 135/87   Pulse 67   Temp 97.6 F (36.4 C) (Temporal)   Ht 5\' 2"  (1.575 m)   Wt 225 lb (102.1 kg)   SpO2 99%   BMI 41.15 kg/m      Assessment & Plan:  BONI MACLELLAN comes in today with chief complaint of Medical Management of Chronic Issues   Diagnosis and orders addressed:  1. Gastroesophageal reflux disease, unspecified whether esophagitis present - Dexlansoprazole 30 MG capsule DR; TAKE ONE CAPSULE BY MOUTH DAILY (NEEDS TO BE SEEN BEFORE NEXT REFILL)  Dispense: 90 capsule; Refill: 3 - CMP14+EGFR - CBC with Differential/Platelet  2. Chronic constipation - linaclotide (LINZESS) 145 MCG CAPS capsule; Take 1 capsule (145 mcg total) by mouth daily before breakfast. (NEEDS TO BE SEEN BEFORE NEXT REFILL)  Dispense: 90 capsule; Refill: 3 - CMP14+EGFR - CBC with Differential/Platelet  3. Annual physical exam - CMP14+EGFR - CBC with Differential/Platelet - Lipid panel - TSH - VITAMIN D 25 Hydroxy (Vit-D Deficiency, Fractures)  4. Mild intermittent asthma without  complication - CMP14+EGFR - CBC with Differential/Platelet  5. Hyperlipidemia, unspecified hyperlipidemia type - CMP14+EGFR - CBC with Differential/Platelet - Lipid panel  6. Morbid obesity - CMP14+EGFR - CBC with Differential/Platelet  7. Psoriasis - CMP14+EGFR - CBC with Differential/Platelet  8. Other fatigue - CMP14+EGFR - CBC with Differential/Platelet - TSH - VITAMIN D 25 Hydroxy (Vit-D Deficiency, Fractures) - EKG 12-Lead  9. Generalized joint pain - CMP14+EGFR - CBC with Differential/Platelet - Ambulatory referral to Cardiology  10. SOB (shortness of breath) -Given new  SOB on exertion and fatigue referral to Cardiologists for work up - CMP14+EGFR - CBC with Differential/Platelet - EKG 12-Lead - Ambulatory referral to Cardiology   Labs pending Continue mediations  Keep follow up with Specialists  Health Maintenance reviewed Diet and exercise encouraged  Follow up plan: 6 months    Jannifer Rodney, FNP

## 2022-11-07 NOTE — Patient Instructions (Signed)
Shortness of Breath, Adult Shortness of breath is when a person has trouble breathing or when a person feels like she or he is having trouble breathing in enough air. Shortness of breath could be a sign of a medical problem. Follow these instructions at home:  Pollutants Do not use any products that contain nicotine or tobacco. These products include cigarettes, chewing tobacco, and vaping devices, such as e-cigarettes. This also includes cigars and pipes. If you need help quitting, ask your health care provider. Avoid things that can irritate your airways, including: Smoke. This includes campfire smoke, forest fire smoke, and secondhand smoke from tobacco products. Do not smoke or allow others to smoke in your home. Mold. Dust. Air pollution. Chemical fumes. Things that can give you an allergic reaction (allergens) if you have allergies. Common allergens include pollen from grasses or trees and animal dander. Keep your living space clean and free of mold and dust. General instructions Pay attention to any changes in your symptoms. Take over-the-counter and prescription medicines only as told by your health care provider. This includes oxygen therapy and inhaled medicines. Rest as needed. Return to your normal activities as told by your health care provider. Ask your health care provider what activities are safe for you. Keep all follow-up visits. This is important. Contact a health care provider if: Your condition does not improve as soon as expected. You have a hard time doing your normal activities, even after you rest. You have new symptoms. You cannot walk up stairs or exercise the way that you normally do. Get help right away if: Your shortness of breath gets worse. You have shortness of breath when you are resting. You feel light-headed or you faint. You have a cough that is not controlled with medicines. You cough up blood. You have pain with breathing. You have pain in your  chest, arms, shoulders, or abdomen. You have a fever. These symptoms may be an emergency. Get help right away. Call 911. Do not wait to see if the symptoms will go away. Do not drive yourself to the hospital. Summary Shortness of breath is when a person has trouble breathing enough air. It can be a sign of a medical problem. Avoid things that irritate your lungs, such as smoking, pollution, mold, and dust. Pay attention to changes in your symptoms and contact your health care provider if you have a hard time completing daily activities because of shortness of breath. This information is not intended to replace advice given to you by your health care provider. Make sure you discuss any questions you have with your health care provider. Document Revised: 02/17/2021 Document Reviewed: 02/17/2021 Elsevier Patient Education  2023 Elsevier Inc.  

## 2022-11-08 ENCOUNTER — Other Ambulatory Visit: Payer: Self-pay | Admitting: Family

## 2022-11-08 LAB — CBC WITH DIFFERENTIAL/PLATELET
Basophils Absolute: 0 10*3/uL (ref 0.0–0.2)
Basos: 1 %
EOS (ABSOLUTE): 0.1 10*3/uL (ref 0.0–0.4)
Eos: 2 %
Hematocrit: 41 % (ref 34.0–46.6)
Hemoglobin: 13.6 g/dL (ref 11.1–15.9)
Immature Grans (Abs): 0 10*3/uL (ref 0.0–0.1)
Immature Granulocytes: 0 %
Lymphocytes Absolute: 1.2 10*3/uL (ref 0.7–3.1)
Lymphs: 25 %
MCH: 27.9 pg (ref 26.6–33.0)
MCHC: 33.2 g/dL (ref 31.5–35.7)
MCV: 84 fL (ref 79–97)
Monocytes Absolute: 0.6 10*3/uL (ref 0.1–0.9)
Monocytes: 11 %
Neutrophils Absolute: 3 10*3/uL (ref 1.4–7.0)
Neutrophils: 61 %
Platelets: 282 10*3/uL (ref 150–450)
RBC: 4.88 x10E6/uL (ref 3.77–5.28)
RDW: 13.3 % (ref 11.7–15.4)
WBC: 4.9 10*3/uL (ref 3.4–10.8)

## 2022-11-08 LAB — TSH: TSH: 2.09 u[IU]/mL (ref 0.450–4.500)

## 2022-11-08 LAB — LIPID PANEL
Chol/HDL Ratio: 4.2 ratio (ref 0.0–4.4)
Cholesterol, Total: 270 mg/dL — ABNORMAL HIGH (ref 100–199)
HDL: 64 mg/dL (ref >39)
LDL Chol Calc (NIH): 178 mg/dL — ABNORMAL HIGH (ref 0–99)
Triglycerides: 157 mg/dL — ABNORMAL HIGH (ref 0–149)
VLDL Cholesterol Cal: 28 mg/dL (ref 5–40)

## 2022-11-08 LAB — CMP14+EGFR
ALT: 47 IU/L — ABNORMAL HIGH (ref 0–32)
AST: 41 IU/L — ABNORMAL HIGH (ref 0–40)
Albumin/Globulin Ratio: 2 (ref 1.2–2.2)
Albumin: 4.5 g/dL (ref 3.9–4.9)
Alkaline Phosphatase: 125 IU/L — ABNORMAL HIGH (ref 44–121)
BUN/Creatinine Ratio: 17 (ref 12–28)
BUN: 12 mg/dL (ref 8–27)
Bilirubin Total: 0.4 mg/dL (ref 0.0–1.2)
CO2: 21 mmol/L (ref 20–29)
Calcium: 9.5 mg/dL (ref 8.7–10.3)
Chloride: 103 mmol/L (ref 96–106)
Creatinine, Ser: 0.71 mg/dL (ref 0.57–1.00)
Globulin, Total: 2.2 g/dL (ref 1.5–4.5)
Glucose: 96 mg/dL (ref 70–99)
Potassium: 4.7 mmol/L (ref 3.5–5.2)
Sodium: 140 mmol/L (ref 134–144)
Total Protein: 6.7 g/dL (ref 6.0–8.5)
eGFR: 91 mL/min/{1.73_m2} (ref >59)

## 2022-11-08 LAB — VITAMIN D 25 HYDROXY (VIT D DEFICIENCY, FRACTURES): Vit D, 25-Hydroxy: 13.4 ng/mL — ABNORMAL LOW (ref 30.0–100.0)

## 2022-11-08 MED ORDER — ROSUVASTATIN CALCIUM 5 MG PO TABS: 5.0000 mg | ORAL_TABLET | Freq: Every day | ORAL | 3 refills | Status: DC

## 2022-11-08 MED ORDER — VITAMIN D (ERGOCALCIFEROL) 1.25 MG (50000 UNIT) PO CAPS: 50000.0000 [IU] | ORAL_CAPSULE | ORAL | 3 refills | Status: DC

## 2022-11-25 DIAGNOSIS — R5383 Other fatigue: Secondary | ICD-10-CM | POA: Diagnosis not present

## 2022-11-25 DIAGNOSIS — M1991 Primary osteoarthritis, unspecified site: Secondary | ICD-10-CM | POA: Diagnosis not present

## 2022-11-25 DIAGNOSIS — L409 Psoriasis, unspecified: Secondary | ICD-10-CM | POA: Diagnosis not present

## 2022-11-25 DIAGNOSIS — M5442 Lumbago with sciatica, left side: Secondary | ICD-10-CM | POA: Diagnosis not present

## 2022-11-25 DIAGNOSIS — M25511 Pain in right shoulder: Secondary | ICD-10-CM | POA: Diagnosis not present

## 2022-11-27 ENCOUNTER — Other Ambulatory Visit: Payer: Self-pay

## 2022-11-27 DIAGNOSIS — Z1231 Encounter for screening mammogram for malignant neoplasm of breast: Secondary | ICD-10-CM

## 2022-12-11 ENCOUNTER — Ambulatory Visit
Admission: RE | Admit: 2022-12-11 | Discharge: 2022-12-11 | Disposition: A | Discharge status: Home / Self Care | Payer: BC Managed Care – PPO | Source: Ambulatory Visit | Attending: Family | Admitting: Family

## 2022-12-11 DIAGNOSIS — Z1231 Encounter for screening mammogram for malignant neoplasm of breast: Secondary | ICD-10-CM

## 2022-12-13 ENCOUNTER — Other Ambulatory Visit: Payer: Self-pay | Admitting: Family

## 2022-12-13 DIAGNOSIS — R928 Other abnormal and inconclusive findings on diagnostic imaging of breast: Secondary | ICD-10-CM

## 2022-12-16 DIAGNOSIS — L409 Psoriasis, unspecified: Secondary | ICD-10-CM | POA: Diagnosis not present

## 2022-12-16 DIAGNOSIS — M25511 Pain in right shoulder: Secondary | ICD-10-CM | POA: Diagnosis not present

## 2022-12-16 DIAGNOSIS — M5442 Lumbago with sciatica, left side: Secondary | ICD-10-CM | POA: Diagnosis not present

## 2022-12-16 DIAGNOSIS — M1991 Primary osteoarthritis, unspecified site: Secondary | ICD-10-CM | POA: Diagnosis not present

## 2022-12-26 ENCOUNTER — Ambulatory Visit
Admission: RE | Admit: 2022-12-26 | Discharge: 2022-12-26 | Disposition: A | Discharge status: Home / Self Care | Payer: BC Managed Care – PPO | Source: Ambulatory Visit | Attending: Family | Admitting: Family

## 2022-12-26 DIAGNOSIS — R928 Other abnormal and inconclusive findings on diagnostic imaging of breast: Secondary | ICD-10-CM

## 2022-12-26 DIAGNOSIS — N6081 Other benign mammary dysplasias of right breast: Secondary | ICD-10-CM | POA: Diagnosis not present

## 2022-12-28 ENCOUNTER — Encounter: Payer: Self-pay | Admitting: Cardiology

## 2022-12-28 DIAGNOSIS — R0602 Shortness of breath: Secondary | ICD-10-CM | POA: Insufficient documentation

## 2022-12-28 NOTE — Progress Notes (Unsigned)
Cardiology Office Note   Date:  12/30/2022   ID:  Angela Curry, DOB 1951/06/07, MRN [id number]  PCP:  Junie Spencer, FNP  Cardiologist:   None Referring:  Junie Spencer, FNP  Chief Complaint  Patient presents with   Shortness of Breath      History of Present Illness: Angela Curry is a 72 y.o. female who presents for for evaluation of SOB.  She is referred by  Junie Spencer, FNP     She saw Dr. Eden Emms in 2014.  She had a normal stress echo.    She has not had any other prior cardiac workup.  She presents because she has been getting more short of breath since October.  She notices climbing her stairs.  She notices walking 20 yards from her car to her house.  It might take her a minute to recover.  She feels weakness.  She does not describe substernal chest pressure or neck or left arm discomfort.  She has had some right arm discomfort that she felt may have been rotator cuff and was told there was some mild inflammation there previously.  She is not describing any PND or orthopnea though she sleeps with the head of her bed elevated because of reflux.  She has had about a 30 pound weight gain since September 2022 and is unclear on the reasons for this.  She has not had any new edema.  Past Medical History:  Diagnosis Date   Asthma    "light asthma after pneumonia about 15 years ago"   Dyslipidemia    Dysphagia    Fibrocystic breast disease    GERD (gastroesophageal reflux disease)    H/O cold sores    Hiatal hernia    Osteopenia    Psoriasis    Vitamin D deficiency     Past Surgical History:  Procedure Laterality Date   COLONOSCOPY     TONSILLECTOMY     TUBAL LIGATION     UPPER GASTROINTESTINAL ENDOSCOPY       Current Outpatient Medications  Medication Sig Dispense Refill   Acetaminophen 500 MG capsule 1 capsule as needed Orally every 6 hrs     atorvastatin (LIPITOR) 40 MG tablet Take 1 tablet (40 mg total) by mouth daily. 90 tablet 3    Cholecalciferol 50 MCG (2000 UT) TABS 1 tablet Orally Once a day     clobetasol ointment (TEMOVATE) 0.05 % Apply topically as needed. 60 g 2   Dexlansoprazole 30 MG capsule DR TAKE ONE CAPSULE BY MOUTH DAILY (NEEDS TO BE SEEN BEFORE NEXT REFILL) 90 capsule 3   fluocinolone (SYNALAR) 0.01 % external solution Apply topically daily as needed.     fluticasone (FLONASE) 50 MCG/ACT nasal spray Place 2 sprays into both nostrils daily. 16 g 6   ketoconazole (NIZORAL) 2 % shampoo every 14 (fourteen) days.     linaclotide (LINZESS) 145 MCG CAPS capsule Take 1 capsule (145 mcg total) by mouth daily before breakfast. (NEEDS TO BE SEEN BEFORE NEXT REFILL) 90 capsule 3   meloxicam (MOBIC) 15 MG tablet Take 1 tablet (15 mg total) by mouth daily. 90 tablet 3   metoprolol tartrate (LOPRESSOR) 100 MG tablet TAKE 2 HOURS PRIOR TO CT SCAN 1 tablet 0   metroNIDAZOLE (METROCREAM) 0.75 % cream Apply topically daily.     valACYclovir (VALTREX) 1000 MG tablet TAKE 1 TABLET(1000 MG) BY MOUTH TWICE DAILY (Patient taking differently: as needed.) 180 tablet 1  Vitamin D, Ergocalciferol, (DRISDOL) 1.25 MG (50000 UNIT) CAPS capsule Take 1 capsule (50,000 Units total) by mouth every 7 (seven) days. 12 capsule 3   Fluticasone-Salmeterol (ADVAIR DISKUS) 100-50 MCG/DOSE AEPB Inhale 1 puff into the lungs in the morning and at bedtime. (Patient taking differently: Inhale 1 puff into the lungs 2 (two) times daily as needed.) 60 each 2   No current facility-administered medications for this visit.    Allergies:   Amoxicillin    Social History:  The patient  reports that she has never smoked. She has never used smokeless tobacco. She reports current alcohol use. She reports that she does not use drugs.   Family History:  The patient's family history includes Breast cancer in her paternal aunt and paternal aunt; Cancer in her paternal aunt and paternal aunt; Diabetes in her mother; Drug abuse in her father; Early death in her father;  Heart disease (age of onset: 42) in her father.    ROS:  Please see the history of present illness.   Otherwise, review of systems are positive for none.   All other systems are reviewed and negative.    PHYSICAL EXAM: VS:  BP (!) 142/88 (BP Location: Left Arm, Patient Position: Sitting, Cuff Size: Large)   Pulse 82   Ht 5\' 2"  (1.575 m)   Wt 230 lb (104.3 kg)   SpO2 94%   BMI 42.07 kg/m  , BMI Body mass index is 42.07 kg/m. GENERAL:  Well appearing HEENT:  Pupils equal round and reactive, fundi not visualized, oral mucosa unremarkable NECK:  No jugular venous distention, waveform within normal limits, carotid upstroke brisk and symmetric, no bruits, no thyromegaly LYMPHATICS:  No cervical, inguinal adenopathy LUNGS:  Clear to auscultation bilaterally BACK:  No CVA tenderness CHEST:  Unremarkable HEART:  PMI not displaced or sustained,S1 and S2 within normal limits, no S3, no S4, no clicks, no rubs, no murmurs ABD:  Flat, positive bowel sounds normal in frequency in pitch, no bruits, no rebound, no guarding, no midline pulsatile mass, no hepatomegaly, no splenomegaly EXT:  2 plus pulses throughout, no edema, no cyanosis no clubbing SKIN:  No rashes no nodules NEURO:  Cranial nerves II through XII grossly intact, motor grossly intact throughout PSYCH:  Cognitively intact, oriented to person place and time    EKG:  EKG is ordered today. The ekg ordered today demonstrates sinus rhythm, rate 82, axis within normal limits, intervals within normal limits, no acute ST-T wave changes.   Recent Labs: 11/07/2022: ALT 47; BUN 12; Creatinine, Ser 0.71; Hemoglobin 13.6; Platelets 282; Potassium 4.7; Sodium 140; TSH 2.090    Lipid Panel    Component Value Date/Time   CHOL 270 (H) 11/07/2022 1245   CHOL 265 (H) 11/10/2012 1729   TRIG 157 (H) 11/07/2022 1245   TRIG 133 11/10/2012 1729   HDL 64 11/07/2022 1245   HDL 54 11/10/2012 1729   CHOLHDL 4.2 11/07/2022 1245   LDLCALC 178 (H)  11/07/2022 1245   LDLCALC 184 (H) 11/10/2012 1729      Wt Readings from Last 3 Encounters:  12/30/22 230 lb (104.3 kg)  11/07/22 225 lb (102.1 kg)  08/08/22 220 lb (99.8 kg)      Other studies Reviewed: Additional studies/ records that were reviewed today include: Labs. Review of the above records demonstrates:  Please see elsewhere in the note.     ASSESSMENT AND PLAN:  SOB: This could be an anginal equivalent.  Alternatively it could be weight and  deconditioning.  She does have cardiovascular risk factors.  I like to screen her for obstructive coronary disease but she would not be able to walk on a treadmill.  She will have a coronary CTA.  This will also help me develop goals of therapy.  I will also screen with a BNP level although I am not strongly suspecting heart failure.  Dyslipidemia: LDL was 178.  Total was 270.  She said she could not take Crestor recently because of headaches.  She agrees to try Lipitor 40 mg daily with a repeat lipid profile in 3 months.   Current medicines are reviewed at length with the patient today.  The patient does not have concerns regarding medicines.  The following changes have been made: As above  Labs/ tests ordered today include:   Orders Placed This Encounter  Procedures   CT CORONARY MORPH W/CTA COR W/SCORE W/CA W/CM &/OR WO/CM   Lipid panel   Pro b natriuretic peptide (BNP)[facility])   EKG 12-Lead     Disposition:   FU with me based on the results of the above.   Signed, Rollene Rotunda, MD  12/30/2022 10:05 AM    [facility]

## 2022-12-30 ENCOUNTER — Ambulatory Visit: Payer: BC Managed Care – PPO | Attending: Cardiology | Admitting: Cardiology

## 2022-12-30 ENCOUNTER — Encounter: Payer: Self-pay | Admitting: Cardiology

## 2022-12-30 VITALS — BP 142/88 | HR 82 | Ht 62.0 in | Wt 230.0 lb

## 2022-12-30 DIAGNOSIS — E785 Hyperlipidemia, unspecified: Secondary | ICD-10-CM | POA: Diagnosis not present

## 2022-12-30 DIAGNOSIS — R0602 Shortness of breath: Secondary | ICD-10-CM

## 2022-12-30 MED ORDER — METOPROLOL TARTRATE 100 MG PO TABS: ORAL_TABLET | ORAL | 0 refills | Status: DC

## 2022-12-30 MED ORDER — ATORVASTATIN CALCIUM 40 MG PO TABS: 40.0000 mg | ORAL_TABLET | Freq: Every day | ORAL | 3 refills | Status: DC

## 2022-12-30 NOTE — Patient Instructions (Signed)
Medication Instructions:   START ATORVASTATIN 40 MG ONCE DAILY  *If you need a refill on your cardiac medications before your next appointment, please call your pharmacy*   Lab Work:  Your physician recommends that you return for lab work in: 3 MONTHS-FASTING  If you have labs (blood work) drawn today and your tests are completely normal, you will receive your results only by: MyChart Message (if you have MyChart) OR A paper copy in the mail If you have any lab test that is abnormal or we need to change your treatment, we will call you to review the results.   Testing/Procedures:    Your cardiac CT will be scheduled at   [facility] [address] [place], [place] [address] [phone]   If scheduled at [facility], please arrive at the [facility] and Children's Entrance (Entrance C2) of [facility] 30 minutes prior to test start time. You can use the FREE valet parking offered at entrance C (encouraged to control the heart rate for the test)  Proceed to the [facility] Radiology Department (first floor) to check-in and test prep.  All radiology patients and guests should use entrance C2 at [facility], accessed from [facility], even though the hospital's physical address listed is [address]       Please follow these instructions carefully (unless otherwise directed):    On the Night Before the Test: Be sure to Drink plenty of water. Do not consume any caffeinated/decaffeinated beverages or chocolate 12 hours prior to your test. Do not take any antihistamines 12 hours prior to your test.  On the Day of the Test: Drink plenty of water until 1 hour prior to the test. Do not eat any food 1 hour prior to test. You may take your regular medications prior to the test.  Take metoprolol (Lopressor) 100 MG two hours prior to test. FEMALES- please wear underwire-free bra if available, avoid dresses & tight  clothing    After the Test: Drink plenty of water. After receiving IV contrast, you may experience a mild flushed feeling. This is normal. On occasion, you may experience a mild rash up to 24 hours after the test. This is not dangerous. If this occurs, you can take Benadryl 25 mg and increase your fluid intake. If you experience trouble breathing, this can be serious. If it is severe call 911 IMMEDIATELY. If it is mild, please call our office. If you take any of these medications: Glipizide/Metformin, Avandament, Glucavance, please do not take 48 hours after completing test unless otherwise instructed.  We will call to schedule your test 2-4 weeks out understanding that some insurance companies will need an authorization prior to the service being performed.   For non-scheduling related questions, please contact the cardiac imaging nurse navigator should you have any questions/concerns: Rockwell [place], Cardiac Imaging Nurse Navigator Larey Brick, Cardiac Imaging Nurse Navigator [facility] Direct Office Dial: [phone]   For scheduling needs, including cancellations and rescheduling, please call [place], [phone].    Follow-Up: At [facility], you and your health needs are our priority.  As part of our continuing mission to provide you with exceptional heart care, we have created designated Provider Care Teams.  These Care Teams include your primary Cardiologist (physician) and Advanced Practice Providers (APPs -  Physician Assistants and Nurse Practitioners) who all work together to provide you with the care you need, when you need it.  We recommend signing up for the patient portal called "MyChart".  Sign up information is provided on this After Visit Summary.  MyChart is used to connect with patients for Virtual Visits (Telemedicine).  Patients are able to view lab/test results, encounter notes, upcoming appointments, etc.  Non-urgent messages  can be sent to your provider as well.   To learn more about what you can do with MyChart, go to [email or web address].    Your next appointment:    AS NEEDED BASED ON TEST RESULTS

## 2022-12-31 ENCOUNTER — Other Ambulatory Visit: Payer: Self-pay | Admitting: Cardiology

## 2022-12-31 LAB — PRO B NATRIURETIC PEPTIDE: NT-Pro BNP: 51 pg/mL (ref 0–301)

## 2023-01-14 ENCOUNTER — Other Ambulatory Visit (HOSPITAL_COMMUNITY): Payer: BC Managed Care – PPO

## 2023-01-20 ENCOUNTER — Encounter (HOSPITAL_COMMUNITY): Payer: Self-pay

## 2023-01-21 ENCOUNTER — Telehealth (HOSPITAL_COMMUNITY): Payer: Self-pay | Admitting: *Deleted

## 2023-01-21 NOTE — Telephone Encounter (Signed)
Reaching out to patient to offer assistance regarding upcoming cardiac imaging study; pt verbalizes understanding of appt date/time, parking situation and where to check in, pre-test NPO status and medications ordered, and verified current allergies; name and call back number provided for further questions should they arise  Nazyia Gaugh RN Navigator Cardiac Imaging [facility] [phone] office [phone] cell  Patient to take 100mg metoprolol tartrate two hours prior to her cardiac CT scan. She is aware to arrive at 3:30pm. 

## 2023-01-22 ENCOUNTER — Ambulatory Visit (HOSPITAL_COMMUNITY)
Admission: RE | Admit: 2023-01-22 | Discharge: 2023-01-22 | Disposition: A | Discharge status: Home / Self Care | Payer: BC Managed Care – PPO | Source: Ambulatory Visit | Attending: Cardiology | Admitting: Cardiology

## 2023-01-22 DIAGNOSIS — I2510 Atherosclerotic heart disease of native coronary artery without angina pectoris: Secondary | ICD-10-CM | POA: Diagnosis not present

## 2023-01-22 DIAGNOSIS — R931 Abnormal findings on diagnostic imaging of heart and coronary circulation: Secondary | ICD-10-CM | POA: Insufficient documentation

## 2023-01-22 DIAGNOSIS — R0602 Shortness of breath: Secondary | ICD-10-CM | POA: Diagnosis not present

## 2023-01-22 MED ORDER — NITROGLYCERIN 0.4 MG SL SUBL
SUBLINGUAL_TABLET | SUBLINGUAL | Status: AC
  Filled 2023-01-22: qty 2

## 2023-01-22 MED ORDER — IOHEXOL 350 MG/ML SOLN
100.0000 mL | Freq: Once | INTRAVENOUS | Status: AC | PRN
  Administered 2023-01-22: 100 mL via INTRAVENOUS

## 2023-01-22 MED ORDER — NITROGLYCERIN 0.4 MG SL SUBL
0.8000 mg | SUBLINGUAL_TABLET | Freq: Once | SUBLINGUAL | Status: AC
  Administered 2023-01-22: 0.8 mg via SUBLINGUAL

## 2023-01-23 ENCOUNTER — Ambulatory Visit (HOSPITAL_BASED_OUTPATIENT_CLINIC_OR_DEPARTMENT_OTHER)
Admission: RE | Admit: 2023-01-23 | Discharge: 2023-01-23 | Disposition: A | Discharge status: Home / Self Care | Payer: BC Managed Care – PPO | Source: Ambulatory Visit | Attending: Cardiology | Admitting: Cardiology

## 2023-01-23 ENCOUNTER — Other Ambulatory Visit: Payer: Self-pay | Admitting: Cardiology

## 2023-01-23 DIAGNOSIS — R931 Abnormal findings on diagnostic imaging of heart and coronary circulation: Secondary | ICD-10-CM

## 2023-01-23 DIAGNOSIS — R0602 Shortness of breath: Secondary | ICD-10-CM | POA: Diagnosis not present

## 2023-02-14 ENCOUNTER — Other Ambulatory Visit: Payer: Self-pay | Admitting: *Deleted

## 2023-02-14 ENCOUNTER — Encounter: Payer: Self-pay | Admitting: *Deleted

## 2023-02-14 DIAGNOSIS — R0602 Shortness of breath: Secondary | ICD-10-CM

## 2023-03-13 ENCOUNTER — Other Ambulatory Visit (HOSPITAL_COMMUNITY): Payer: Self-pay | Admitting: *Deleted

## 2023-03-13 DIAGNOSIS — R0609 Other forms of dyspnea: Secondary | ICD-10-CM

## 2023-03-14 ENCOUNTER — Telehealth (HOSPITAL_COMMUNITY): Payer: Self-pay | Admitting: *Deleted

## 2023-03-14 NOTE — Telephone Encounter (Signed)
Reaching out to patient to offer assistance regarding upcoming cardiac imaging study; pt verbalizes understanding of appt date/time, parking situation and where to check in, pre-test NPO status, and verified current allergies; name and call back number provided for further questions should they arise  Merle Prescott RN Navigator Cardiac Imaging [facility] [phone] office [phone] cell  Patient aware to avoid caffeine 12 hours prior to her cardiac PET scan.  

## 2023-03-18 ENCOUNTER — Ambulatory Visit (HOSPITAL_COMMUNITY)
Admission: RE | Admit: 2023-03-18 | Discharge: 2023-03-18 | Disposition: A | Discharge status: Home / Self Care | Payer: BC Managed Care – PPO | Source: Ambulatory Visit | Attending: Cardiology | Admitting: Cardiology

## 2023-03-18 DIAGNOSIS — R0602 Shortness of breath: Secondary | ICD-10-CM | POA: Insufficient documentation

## 2023-03-18 DIAGNOSIS — I2510 Atherosclerotic heart disease of native coronary artery without angina pectoris: Secondary | ICD-10-CM | POA: Diagnosis not present

## 2023-03-18 DIAGNOSIS — K449 Diaphragmatic hernia without obstruction or gangrene: Secondary | ICD-10-CM | POA: Diagnosis not present

## 2023-03-18 DIAGNOSIS — I517 Cardiomegaly: Secondary | ICD-10-CM | POA: Diagnosis not present

## 2023-03-18 DIAGNOSIS — R918 Other nonspecific abnormal finding of lung field: Secondary | ICD-10-CM | POA: Diagnosis not present

## 2023-03-18 LAB — NM PET CT CARDIAC PERFUSION MULTI W/ABSOLUTE BLOODFLOW
LV dias vol: 115 mL (ref 46–106)
LV sys vol: 40 mL
MBFR: 2.4
Nuc Rest EF: 59 %
Nuc Stress EF: 65 %
Rest MBF: 1.19 ml/g/min
Rest Nuclear Isotope Dose: 27 mCi
ST Depression (mm): 0.5 mm
Stress MBF: 2.86 ml/g/min
Stress Nuclear Isotope Dose: 27 mCi

## 2023-03-18 MED ORDER — REGADENOSON 0.4 MG/5ML IV SOLN
INTRAVENOUS | Status: AC
  Filled 2023-03-18: qty 5

## 2023-03-18 MED ORDER — DEXTROSE 5 % IV SOLN
INTRAVENOUS | Status: AC
  Filled 2023-03-18: qty 50

## 2023-03-18 MED ORDER — REGADENOSON 0.4 MG/5ML IV SOLN
0.4000 mg | Freq: Once | INTRAVENOUS | Status: AC
  Administered 2023-03-18: 0.4 mg via INTRAVENOUS

## 2023-03-18 MED ORDER — RUBIDIUM RB82 GENERATOR (RUBYFILL)
27.0500 | PACK | Freq: Once | INTRAVENOUS | Status: AC
  Administered 2023-03-18: 27.05 via INTRAVENOUS

## 2023-03-18 MED ORDER — CAFFEINE CITRATE BASE COMPONENT 10 MG/ML IV SOLN
INTRAVENOUS | Status: AC
  Filled 2023-03-18: qty 3

## 2023-03-18 MED ORDER — RUBIDIUM RB82 GENERATOR (RUBYFILL)
27.0400 | PACK | Freq: Once | INTRAVENOUS | Status: AC
  Administered 2023-03-18: 27.04 via INTRAVENOUS

## 2023-03-19 ENCOUNTER — Other Ambulatory Visit: Payer: Self-pay

## 2023-03-19 DIAGNOSIS — R911 Solitary pulmonary nodule: Secondary | ICD-10-CM

## 2023-04-01 ENCOUNTER — Ambulatory Visit (INDEPENDENT_AMBULATORY_CARE_PROVIDER_SITE_OTHER): Payer: BC Managed Care – PPO

## 2023-04-01 ENCOUNTER — Encounter: Payer: Self-pay | Admitting: Family Medicine

## 2023-04-01 ENCOUNTER — Ambulatory Visit (INDEPENDENT_AMBULATORY_CARE_PROVIDER_SITE_OTHER): Payer: BC Managed Care – PPO | Admitting: Family Medicine

## 2023-04-01 VITALS — BP 132/82 | HR 86 | Ht 62.0 in | Wt 223.0 lb

## 2023-04-01 DIAGNOSIS — M419 Scoliosis, unspecified: Secondary | ICD-10-CM | POA: Diagnosis not present

## 2023-04-01 DIAGNOSIS — M545 Low back pain, unspecified: Secondary | ICD-10-CM | POA: Diagnosis not present

## 2023-04-01 DIAGNOSIS — L0291 Cutaneous abscess, unspecified: Secondary | ICD-10-CM | POA: Insufficient documentation

## 2023-04-01 DIAGNOSIS — M25552 Pain in left hip: Secondary | ICD-10-CM

## 2023-04-01 DIAGNOSIS — M5416 Radiculopathy, lumbar region: Secondary | ICD-10-CM | POA: Insufficient documentation

## 2023-04-01 DIAGNOSIS — L02212 Cutaneous abscess of back [any part, except buttock]: Secondary | ICD-10-CM

## 2023-04-01 DIAGNOSIS — M47816 Spondylosis without myelopathy or radiculopathy, lumbar region: Secondary | ICD-10-CM | POA: Diagnosis not present

## 2023-04-01 DIAGNOSIS — M4316 Spondylolisthesis, lumbar region: Secondary | ICD-10-CM | POA: Diagnosis not present

## 2023-04-01 MED ORDER — GABAPENTIN 100 MG PO CAPS: 200.0000 mg | ORAL_CAPSULE | Freq: Every day | ORAL | 0 refills | Status: DC

## 2023-04-01 MED ORDER — METHYLPREDNISOLONE ACETATE 80 MG/ML IJ SUSP
80.0000 mg | Freq: Once | INTRAMUSCULAR | Status: AC
Start: 2023-04-01 — End: 2023-04-01
  Administered 2023-04-01: 80 mg via INTRAMUSCULAR

## 2023-04-01 MED ORDER — KETOROLAC TROMETHAMINE 60 MG/2ML IM SOLN
60.0000 mg | Freq: Once | INTRAMUSCULAR | Status: AC
Start: 2023-04-01 — End: 2023-04-01
  Administered 2023-04-01: 60 mg via INTRAMUSCULAR

## 2023-04-01 MED ORDER — PREDNISONE 20 MG PO TABS: ORAL_TABLET | ORAL | 0 refills | Status: DC

## 2023-04-01 MED ORDER — DOXYCYCLINE HYCLATE 100 MG PO TABS: 100.0000 mg | ORAL_TABLET | Freq: Two times a day (BID) | ORAL | 0 refills | Status: DC

## 2023-04-01 NOTE — Assessment & Plan Note (Signed)
Small abscess formation that hopefully antibiotics will help with.  Patient knows if it does not completely resolve in 1 week needs to be seen or if any worsening symptoms.

## 2023-04-01 NOTE — Assessment & Plan Note (Signed)
Acute lumbar radiculopathy with radicular symptoms in more of the L4-L5 distribution.  Does have some weakness noted in the L5 distribution with dorsiflexion of the foot.  Discussed with patient about icing regimen, home exercises, gabapentin and prednisone given.  Toradol and Depo-Medrol injection given today as well.  X-rays pending.  Will worsening pain advanced imaging would be warranted.  On exam patient did also have a small abscess formation noted and given doxycycline.  Does not seem to be related to this and seems to be higher in the area.  Discussed if worsening pain, fevers or chills to seek medical attention immediately.  Follow-up with me again in 6 to 8 weeks

## 2023-04-01 NOTE — Progress Notes (Signed)
[name] [address] [place] [address] Phone: [phone] Subjective:    I'm seeing this patient by the request  of:  Junie Spencer, FNP  CC: Low back and left hip pain  [id number]  Angela Curry is a 72 y.o. female coming in with complaint of pain in her left hip she was stepping up 3 weeks ago and felt a catch. The pain radiates down the leg. Sunday morning she discovered  low back pain (lump) with hotness. She has iced and heated. Ibu and tylenol  for the pain and that did not help. Pain has decreased today but still there. She isnt able to sleep through the night       Past Medical History:  Diagnosis Date   Asthma    "light asthma after pneumonia about 15 years ago"   Dyslipidemia    Dysphagia    Fibrocystic breast disease    GERD (gastroesophageal reflux disease)    H/O cold sores    Hiatal hernia    Osteopenia    Psoriasis    Vitamin D deficiency    Past Surgical History:  Procedure Laterality Date   COLONOSCOPY     TONSILLECTOMY     TUBAL LIGATION     UPPER GASTROINTESTINAL ENDOSCOPY     Social History   Socioeconomic History   Marital status: Divorced    Spouse name: Not on file   Number of children: Not on file   Years of education: Not on file   Highest education level: Not on file  Occupational History   Not on file  Tobacco Use   Smoking status: Never   Smokeless tobacco: Never  Vaping Use   Vaping status: Never Used  Substance and Sexual Activity   Alcohol use: Yes    Comment: few days a week   Drug use: No   Sexual activity: Not on file  Other Topics Concern   Not on file  Social History Narrative   Lives alone.  She is divorced and has 2 children and 5 grands.     Social Determinants of Health   Financial Resource Strain: Not on file  Food Insecurity: Not on file  Transportation Needs: Not on file  Physical Activity: Not on file  Stress: Not on file  Social Connections: Not on  file   Allergies  Allergen Reactions   Amoxicillin Other (See Comments)   Family History  Problem Relation Age of Onset   Diabetes Mother    Heart disease Father 19       Heart attack   Drug abuse Father    Early death Father    Cancer Paternal Aunt        breast   Breast cancer Paternal Aunt    Cancer Paternal Aunt        breast   Breast cancer Paternal Aunt    Esophageal cancer Neg Hx    Colon cancer Neg Hx    Stomach cancer Neg Hx    Rectal cancer Neg Hx     Current Outpatient Medications (Endocrine & Metabolic):    predniSONE (DELTASONE) 20 MG tablet, Take one tablet daily for the next 5 days.  Current Outpatient Medications (Cardiovascular):    atorvastatin (LIPITOR) 40 MG tablet, Take 1 tablet (40 mg total) by mouth daily.   metoprolol tartrate (LOPRESSOR) 100 MG tablet, TAKE 2 HOURS PRIOR TO CT SCAN  Current Outpatient Medications (Respiratory):    fluticasone (FLONASE) 50  MCG/ACT nasal spray, Place 2 sprays into both nostrils daily.   Fluticasone-Salmeterol (ADVAIR DISKUS) 100-50 MCG/DOSE AEPB, Inhale 1 puff into the lungs in the morning and at bedtime. (Patient taking differently: Inhale 1 puff into the lungs 2 (two) times daily as needed.)  Current Outpatient Medications (Analgesics):    meloxicam (MOBIC) 15 MG tablet, Take 1 tablet (15 mg total) by mouth daily.   Acetaminophen 500 MG capsule, 1 capsule as needed Orally every 6 hrs   Current Outpatient Medications (Other):    Cholecalciferol 50 MCG (2000 UT) TABS, 1 tablet Orally Once a day   clobetasol ointment (TEMOVATE) 0.05 %, Apply topically as needed.   Dexlansoprazole 30 MG capsule DR, TAKE ONE CAPSULE BY MOUTH DAILY (NEEDS TO BE SEEN BEFORE NEXT REFILL)   doxycycline (VIBRA-TABS) 100 MG tablet, Take 1 tablet (100 mg total) by mouth 2 (two) times daily.   fluocinolone (SYNALAR) 0.01 % external solution, Apply topically daily as needed.   gabapentin (NEURONTIN) 100 MG capsule, Take 2 capsules (200 mg  total) by mouth daily.   ketoconazole (NIZORAL) 2 % shampoo, every 14 (fourteen) days.   linaclotide (LINZESS) 145 MCG CAPS capsule, Take 1 capsule (145 mcg total) by mouth daily before breakfast. (NEEDS TO BE SEEN BEFORE NEXT REFILL)   metroNIDAZOLE (METROCREAM) 0.75 % cream, Apply topically daily.   Vitamin D, Ergocalciferol, (DRISDOL) 1.25 MG (50000 UNIT) CAPS capsule, Take 1 capsule (50,000 Units total) by mouth every 7 (seven) days.   valACYclovir (VALTREX) 1000 MG tablet, TAKE 1 TABLET(1000 MG) BY MOUTH TWICE DAILY (Patient taking differently: as needed.)   Reviewed prior external information including notes and imaging from  primary care provider As well as notes that were available from care everywhere and other healthcare systems.  Past medical history, social, surgical and family history all reviewed in electronic medical record.  No pertanent information unless stated regarding to the chief complaint.   Review of Systems:  No headache, visual changes, nausea, vomiting, diarrhea, constipation, dizziness, abdominal pain, skin rash, fevers, chills, night sweats, weight loss, swollen lymph nodes, body aches, joint swelling, chest pain, shortness of breath, mood changes. POSITIVE muscle aches  Objective  Blood pressure 132/82, pulse 86, height 5\' 2"  (1.575 m), weight 223 lb (101.2 kg), SpO2 99%.   General: No apparent distress alert and oriented x3 mood and affect normal, dressed appropriately.  HEENT: Pupils equal, extraocular movements intact  Respiratory: Patient's speak in full sentences and does not appear short of breath  Cardiovascular: No lower extremity edema, non tender, no erythema  Patient does have a severely antalgic gait noted.  Tender to palpation over the paraspinal musculature lumbar spine.  Small abscess formation that seems to be localizing to the soft tissue higher than this area though.  Positive straight leg test noted.  This is at 20 degrees.  Radicular symptoms  in the L4 distribution.    Impression and Recommendations:    The above documentation has been reviewed and is accurate and complete Judi Saa, DO

## 2023-04-01 NOTE — Patient Instructions (Addendum)
Low back and hip HEP  Xray's on the way out  Prednisone 20 daily 7 days starting tomorrow  Gabapentin 200 mg starting tonight  Doxycycline  100 mg 2x a day for 7 days  Send a message in 2 weeks  4-5 week follow up

## 2023-04-10 ENCOUNTER — Ambulatory Visit: Payer: BC Managed Care – PPO | Admitting: Cardiology

## 2023-05-05 NOTE — Progress Notes (Unsigned)
[name] [address] [place] [address] Phone: [phone] Subjective:   Angela Curry, am serving as a scribe for Dr. Antoine Primas.  I'm seeing this patient by the request  of:  Junie Spencer, FNP  CC: Low back pain follow-up  [id number]  04/01/2023 Small abscess formation that hopefully antibiotics will help with.  Patient knows if it does not completely resolve in 1 week needs to be seen or if any worsening symptoms.     Acute lumbar radiculopathy with radicular symptoms in more of the L4-L5 distribution.  Does have some weakness noted in the L5 distribution with dorsiflexion of the foot.  Discussed with patient about icing regimen, home exercises, gabapentin and prednisone given.  Toradol and Depo-Medrol injection given today as well.  X-rays pending.  Will worsening pain advanced imaging would be warranted.  On exam patient did also have a small abscess formation noted and given doxycycline.  Does not seem to be related to this and seems to be higher in the area.  Discussed if worsening pain, fevers or chills to seek medical attention immediately.  Follow-up with me again in 6 to 8 weeks     Updated 05/06/2023 Angela Curry is a 72 y.o. female coming in with complaint of back pain. Patient states that her pain has improved recently but since last visit she did have a day in which her pain dramatically increased. Has been working on exercises.   Patient did have x-rays at last exam that did show patient did have degenerative joint disease and degenerative disc disease as well as a grade 1 anterolisthesis at L4-L5    Past Medical History:  Diagnosis Date   Asthma    "light asthma after pneumonia about 15 years ago"   Dyslipidemia    Dysphagia    Fibrocystic breast disease    GERD (gastroesophageal reflux disease)    H/O cold sores    Hiatal hernia    Osteopenia    Psoriasis    Vitamin D deficiency    Past Surgical  History:  Procedure Laterality Date   COLONOSCOPY     TONSILLECTOMY     TUBAL LIGATION     UPPER GASTROINTESTINAL ENDOSCOPY     Social History   Socioeconomic History   Marital status: Divorced    Spouse name: Not on file   Number of children: Not on file   Years of education: Not on file   Highest education level: Not on file  Occupational History   Not on file  Tobacco Use   Smoking status: Never   Smokeless tobacco: Never  Vaping Use   Vaping status: Never Used  Substance and Sexual Activity   Alcohol use: Yes    Comment: few days a week   Drug use: No   Sexual activity: Not on file  Other Topics Concern   Not on file  Social History Narrative   Lives alone.  She is divorced and has 2 children and 5 grands.     Social Determinants of Health   Financial Resource Strain: Not on file  Food Insecurity: Not on file  Transportation Needs: Not on file  Physical Activity: Not on file  Stress: Not on file  Social Connections: Not on file   Allergies  Allergen Reactions   Amoxicillin Other (See Comments)   Family History  Problem Relation Age of Onset   Diabetes Mother    Heart disease Father 54  Heart attack   Drug abuse Father    Early death Father    Cancer Paternal Aunt        breast   Breast cancer Paternal Aunt    Cancer Paternal Aunt        breast   Breast cancer Paternal Aunt    Esophageal cancer Neg Hx    Colon cancer Neg Hx    Stomach cancer Neg Hx    Rectal cancer Neg Hx     Current Outpatient Medications (Endocrine & Metabolic):    predniSONE (DELTASONE) 20 MG tablet, Take one tablet daily for the next 5 days.  Current Outpatient Medications (Cardiovascular):    atorvastatin (LIPITOR) 40 MG tablet, Take 1 tablet (40 mg total) by mouth daily.  Current Outpatient Medications (Respiratory):    fluticasone (FLONASE) 50 MCG/ACT nasal spray, Place 2 sprays into both nostrils daily.   Fluticasone-Salmeterol (ADVAIR DISKUS) 100-50 MCG/DOSE  AEPB, Inhale 1 puff into the lungs in the morning and at bedtime. (Patient taking differently: Inhale 1 puff into the lungs 2 (two) times daily as needed.)  Current Outpatient Medications (Analgesics):    meloxicam (MOBIC) 15 MG tablet, Take 1 tablet (15 mg total) by mouth daily.   Current Outpatient Medications (Other):    Cholecalciferol 50 MCG (2000 UT) TABS, 1 tablet Orally Once a day   clobetasol ointment (TEMOVATE) 0.05 %, Apply topically as needed.   Dexlansoprazole 30 MG capsule DR, TAKE ONE CAPSULE BY MOUTH DAILY (NEEDS TO BE SEEN BEFORE NEXT REFILL)   doxycycline (VIBRA-TABS) 100 MG tablet, Take 1 tablet (100 mg total) by mouth 2 (two) times daily.   DULoxetine (CYMBALTA) 20 MG capsule, Take 1 capsule (20 mg total) by mouth daily.   fluocinolone (SYNALAR) 0.01 % external solution, Apply topically daily as needed.   gabapentin (NEURONTIN) 100 MG capsule, Take 2 capsules (200 mg total) by mouth at bedtime.   linaclotide (LINZESS) 145 MCG CAPS capsule, Take 1 capsule (145 mcg total) by mouth daily before breakfast. (NEEDS TO BE SEEN BEFORE NEXT REFILL)   metroNIDAZOLE (METROCREAM) 0.75 % cream, Apply topically daily.   Vitamin D, Ergocalciferol, (DRISDOL) 1.25 MG (50000 UNIT) CAPS capsule, Take 1 capsule (50,000 Units total) by mouth every 7 (seven) days.   valACYclovir (VALTREX) 1000 MG tablet, TAKE 1 TABLET(1000 MG) BY MOUTH TWICE DAILY (Patient taking differently: as needed.)   Reviewed prior external information including notes and imaging from  primary care provider As well as notes that were available from care everywhere and other healthcare systems.  Past medical history, social, surgical and family history all reviewed in electronic medical record.  No pertanent information unless stated regarding to the chief complaint.   Review of Systems:  No headache, visual changes, nausea, vomiting, diarrhea, constipation, dizziness, abdominal pain, skin rash, fevers, chills, night  sweats, weight loss, swollen lymph nodes, body aches, joint swelling, chest pain, shortness of breath, mood changes. POSITIVE muscle aches  Objective  Blood pressure 128/72, pulse 87, height 5\' 2"  (1.575 m), weight 235 lb (106.6 kg), SpO2 98%.   General: No apparent distress alert and oriented x3 mood and affect normal, dressed appropriately.  HEENT: Pupils equal, extraocular movements intact  Respiratory: Patient's speak in full sentences and does not appear short of breath  Cardiovascular: No lower extremity edema, non tender, no erythema  Low back exam shows patient does have some loss of lordosis noted.  Some worsening pain with extension of the back.  Able to get up from the  sitting position relatively well.  Still has tightness with straight leg test left greater than right.    Impression and Recommendations:    The above documentation has been reviewed and is accurate and complete Judi Saa, DO

## 2023-05-06 ENCOUNTER — Encounter: Payer: Self-pay | Admitting: *Deleted

## 2023-05-06 ENCOUNTER — Ambulatory Visit: Payer: BC Managed Care – PPO | Admitting: Family Medicine

## 2023-05-06 ENCOUNTER — Encounter: Payer: Self-pay | Admitting: Family Medicine

## 2023-05-06 VITALS — BP 128/72 | HR 87 | Ht 62.0 in | Wt 235.0 lb

## 2023-05-06 DIAGNOSIS — M5416 Radiculopathy, lumbar region: Secondary | ICD-10-CM | POA: Diagnosis not present

## 2023-05-06 MED ORDER — GABAPENTIN 100 MG PO CAPS: 200.0000 mg | ORAL_CAPSULE | Freq: Every day | ORAL | 0 refills | Status: DC

## 2023-05-06 MED ORDER — DULOXETINE HCL 20 MG PO CPEP: 20.0000 mg | ORAL_CAPSULE | Freq: Every day | ORAL | 0 refills | Status: DC

## 2023-05-06 NOTE — Assessment & Plan Note (Signed)
Does have spondylolisthesis noted.  We discussed with patient continuing to have the radicular symptoms and likely signs and symptoms most consistent with the spondylolisthesis and spinal stenosis contributing to the discomfort and pain.  At this point we will consider the possibility of advanced imaging if worsening symptoms.  Will start patient on Cymbalta as well as continue on the gabapentin which patient was only taking in seldomly.  If patient does well with this and conservative therapy we will continue to monitor.  If worsening symptoms advanced imaging would be warranted.  Will follow-up again 6 to 8 weeks

## 2023-05-06 NOTE — Patient Instructions (Addendum)
Cymbalta 20mg   Gabapentin 200mg  at night Continue to stay active See me in 7-8 weeks

## 2023-05-12 ENCOUNTER — Encounter: Payer: Self-pay | Admitting: Family

## 2023-05-12 ENCOUNTER — Ambulatory Visit (INDEPENDENT_AMBULATORY_CARE_PROVIDER_SITE_OTHER): Payer: BC Managed Care – PPO | Admitting: Family

## 2023-05-12 VITALS — BP 120/65 | HR 75 | Temp 98.2°F | Ht 62.0 in | Wt 230.8 lb

## 2023-05-12 DIAGNOSIS — Z23 Encounter for immunization: Secondary | ICD-10-CM

## 2023-05-12 DIAGNOSIS — K5909 Other constipation: Secondary | ICD-10-CM

## 2023-05-12 DIAGNOSIS — E785 Hyperlipidemia, unspecified: Secondary | ICD-10-CM

## 2023-05-12 DIAGNOSIS — J4520 Mild intermittent asthma, uncomplicated: Secondary | ICD-10-CM

## 2023-05-12 DIAGNOSIS — K219 Gastro-esophageal reflux disease without esophagitis: Secondary | ICD-10-CM | POA: Diagnosis not present

## 2023-05-12 DIAGNOSIS — M5416 Radiculopathy, lumbar region: Secondary | ICD-10-CM

## 2023-05-12 MED ORDER — CLOBETASOL PROPIONATE 0.05 % EX OINT: TOPICAL_OINTMENT | CUTANEOUS | 2 refills | Status: AC | PRN

## 2023-05-12 NOTE — Patient Instructions (Signed)
Sciatica  Sciatica is pain, weakness, tingling, or loss of feeling (numbness) along the sciatic nerve. The sciatic nerve starts in the lower back and goes down the back of each leg. Sciatica usually affects one side of the body. Sciatica usually goes away on its own or with treatment. Sometimes, sciatica may come back. What are the causes? This condition happens when the sciatic nerve is pinched or has pressure put on it. This may be caused by: A disk in between the bones of the spine bulging out too far (herniated disk). Changes in the spinal disks due to aging. A condition that affects a muscle in the butt. Extra bone growth near the sciatic nerve. A break (fracture) of the area between your hip bones (pelvis). Pregnancy. Tumor. This is rare. What increases the risk? You are more likely to develop this condition if you: Play sports that put pressure or stress on the spine. Have poor strength and ease of movement (flexibility). Have had a back injury or back surgery. Sit for long periods of time. Do activities that involve bending or lifting over and over again. Are very overweight (obese). What are the signs or symptoms? Symptoms can vary from mild to very bad. They may include: Any of these problems in the lower back, leg, hip, or butt: Mild tingling, loss of feeling, or dull aches. A burning feeling. Sharp pains. Loss of feeling in the back of the calf or the sole of the foot. Leg weakness. Very bad back pain that makes it hard to move. These symptoms may get worse when you cough, sneeze, or laugh. They may also get worse when you sit or stand for long periods of time. How is this treated? This condition often gets better without any treatment. However, treatment may include: Changing or cutting back on physical activity when you have pain. Exercising, including strengthening and stretching. Putting ice or heat on the affected area. Shots of medicines to relieve pain and  swelling or to relax your muscles. Surgery. Follow these instructions at home: Medicines Take over-the-counter and prescription medicines only as told by your doctor. Ask your doctor if you should avoid driving or using machines while you are taking your medicine. Managing pain     If told, put ice on the affected area. To do this: Put ice in a plastic bag. Place a towel between your skin and the bag. Leave the ice on for 20 minutes, 2-3 times a day. If your skin turns bright red, take off the ice right away to prevent skin damage. The risk of skin damage is higher if you cannot feel pain, heat, or cold. If told, put heat on the affected area. Do this as often as told by your doctor. Use the heat source that your doctor tells you to use, such as a moist heat pack or a heating pad. Place a towel between your skin and the heat source. Leave the heat on for 20-30 minutes. If your skin turns bright red, take off the heat right away to prevent burns. The risk of burns is higher if you cannot feel pain, heat, or cold. Activity  Return to your normal activities when your doctor says that it is safe. Avoid activities that make your symptoms worse. Take short rests during the day. When you rest for a long time, do some physical activity or stretching between periods of rest. Avoid sitting for a long time without moving. Get up and move around at least one time each   hour. Do exercises and stretches as told by your doctor. Do not lift anything that is heavier than 10 lb (4.5 kg). Avoid lifting heavy things even when you do not have symptoms. Avoid lifting heavy things over and over. When you lift objects, always lift in a way that is safe for your body. To do this, you should: Bend your knees. Keep the object close to your body. Avoid twisting. General instructions Stay at a healthy weight. Wear comfortable shoes that support your feet. Avoid wearing high heels. Avoid sleeping on a mattress  that is too soft or too hard. You might have less pain if you sleep on a mattress that is firm enough to support your back. Contact a doctor if: Your pain is not controlled by medicine. Your pain does not get better. Your pain gets worse. Your pain lasts longer than 4 weeks. You lose weight without trying. Get help right away if: You cannot control when you pee (urinate) or poop (have a bowel movement). You have weakness in any of these areas and it gets worse: Lower back. The area between your hip bones. Butt. Legs. You have redness or swelling of your back. You have a burning feeling when you pee. Summary Sciatica is pain, weakness, tingling, or loss of feeling (numbness) along the sciatic nerve. This may include the lower back, legs, hips, and butt. This condition happens when the sciatic nerve is pinched or has pressure put on it. Treatment often includes rest, exercise, medicines, and putting ice or heat on the affected area. This information is not intended to replace advice given to you by your health care provider. Make sure you discuss any questions you have with your health care provider. Document Revised: 10/08/2021 Document Reviewed: 10/08/2021 Elsevier Patient Education  2024 [employer]  

## 2023-05-12 NOTE — Progress Notes (Signed)
Subjective:    Patient ID: Angela Curry, female    DOB: 09/25/1950, 72 y.o.   MRN: [id number]  No chief complaint on file.   Pt presents to the office today for chronic follow up. Has morbid obese with a BMI of 42 and GERD and constipation.    She is followed by dermatologists every 6 months.    She is followed by Ortho as needed for right shoulder injection and back pain.  Asthma She complains of hoarse voice, shortness of breath and wheezing. There is no difficulty breathing. This is a chronic problem. The current episode started more than 1 year ago. The problem occurs intermittently. Associated symptoms include heartburn. Her symptoms are alleviated by rest. Her past medical history is significant for asthma.  Gastroesophageal Reflux She complains of belching, heartburn, a hoarse voice and wheezing. The current episode started more than 1 year ago. The problem occurs occasionally. Risk factors include obesity. She has tried a PPI for the symptoms. The treatment provided moderate relief.  Constipation This is a chronic problem. The current episode started more than 1 year ago. The problem has been resolved since onset. Her stool frequency is 1 time per day. Associated symptoms include back pain. Risk factors include obesity. She has tried laxatives (linzess) for the symptoms. The treatment provided moderate relief.  Back Pain This is a chronic problem. The current episode started more than 1 year ago. The problem occurs intermittently. The problem has been waxing and waning since onset. The pain is present in the lumbar spine. The pain is at a severity of 6/10. The pain is moderate.  Hyperlipidemia This is a chronic problem. The current episode started more than 1 year ago. Exacerbating diseases include obesity. Associated symptoms include shortness of breath. Current antihyperlipidemic treatment includes diet change. The current treatment provides mild improvement of lipids. Risk  factors for coronary artery disease include dyslipidemia, hypertension, a sedentary lifestyle and post-menopausal.      Review of Systems  HENT:  Positive for hoarse voice.   Respiratory:  Positive for shortness of breath and wheezing.   Gastrointestinal:  Positive for constipation and heartburn.  Musculoskeletal:  Positive for back pain.       Objective:   Physical Exam Vitals reviewed.  Constitutional:      General: She is not in acute distress.    Appearance: She is well-developed. She is obese.  HENT:     Head: Normocephalic and atraumatic.     Right Ear: Tympanic membrane normal.     Left Ear: Tympanic membrane normal.  Eyes:     Pupils: Pupils are equal, round, and reactive to light.  Neck:     Thyroid: No thyromegaly.  Cardiovascular:     Rate and Rhythm: Normal rate and regular rhythm.     Heart sounds: Normal heart sounds. No murmur heard. Pulmonary:     Effort: Pulmonary effort is normal. No respiratory distress.     Breath sounds: Normal breath sounds. No wheezing.  Abdominal:     General: Bowel sounds are normal. There is no distension.     Palpations: Abdomen is soft.     Tenderness: There is no abdominal tenderness.  Musculoskeletal:        General: No tenderness. Normal range of motion.     Cervical back: Normal range of motion and neck supple.     Right lower leg: Edema (trace) present.     Left lower leg: Edema (trace) present.  Comments: Full ROM of back   Skin:    General: Skin is warm and dry.  Neurological:     Mental Status: She is alert and oriented to person, place, and time.     Cranial Nerves: No cranial nerve deficit.     Deep Tendon Reflexes: Reflexes are normal and symmetric.  Psychiatric:        Behavior: Behavior normal.        Thought Content: Thought content normal.        Judgment: Judgment normal.       BP 120/65   Pulse 75   Temp 98.2 F (36.8 C) (Temporal)   Ht 5\' 2"  (1.575 m)   Wt 230 lb 12.8 oz (104.7 kg)   SpO2  95%   BMI 42.21 kg/m      Assessment & Plan:  VICKILYNN GRATTAN comes in today with chief complaint of No chief complaint on file.   Diagnosis and orders addressed:  1. Encounter for immunization - Flu Vaccine Trivalent High Dose (Fluad) - CMP14+EGFR  2. Hyperlipidemia, unspecified hyperlipidemia type - CMP14+EGFR  3. Gastroesophageal reflux disease, unspecified whether esophagitis present - CMP14+EGFR  4. Mild intermittent asthma without complication - CMP14+EGFR  5. Chronic constipation  - CMP14+EGFR  6. Lumbar radiculopathy, acute - CMP14+EGFR  7. Morbid obesity (HCC) - CMP14+EGFR   Labs pending Continue current medications  Health Maintenance reviewed Diet and exercise encouraged  Follow up plan: 6 months    Jannifer Rodney, FNP

## 2023-05-13 ENCOUNTER — Other Ambulatory Visit: Payer: Self-pay | Admitting: Family

## 2023-05-13 DIAGNOSIS — R748 Abnormal levels of other serum enzymes: Secondary | ICD-10-CM

## 2023-05-13 LAB — CMP14+EGFR
ALT: 101 [IU]/L — ABNORMAL HIGH (ref 0–32)
AST: 82 [IU]/L — ABNORMAL HIGH (ref 0–40)
Albumin: 4.4 g/dL (ref 3.9–4.9)
Alkaline Phosphatase: 143 [IU]/L — ABNORMAL HIGH (ref 44–121)
BUN/Creatinine Ratio: 14 (ref 12–28)
BUN: 10 mg/dL (ref 8–27)
Bilirubin Total: 0.4 mg/dL (ref 0.0–1.2)
CO2: 19 mmol/L — ABNORMAL LOW (ref 20–29)
Calcium: 9.8 mg/dL (ref 8.7–10.3)
Chloride: 101 mmol/L (ref 96–106)
Creatinine, Ser: 0.73 mg/dL (ref 0.57–1.00)
Globulin, Total: 2.4 g/dL (ref 1.5–4.5)
Glucose: 86 mg/dL (ref 70–99)
Potassium: 4.8 mmol/L (ref 3.5–5.2)
Sodium: 141 mmol/L (ref 134–144)
Total Protein: 6.8 g/dL (ref 6.0–8.5)
eGFR: 88 mL/min/{1.73_m2} (ref >59)

## 2023-05-26 ENCOUNTER — Ambulatory Visit (HOSPITAL_COMMUNITY)
Admission: RE | Admit: 2023-05-26 | Discharge: 2023-05-26 | Disposition: A | Discharge status: Home / Self Care | Payer: BC Managed Care – PPO | Source: Ambulatory Visit | Attending: Family | Admitting: Family

## 2023-05-26 DIAGNOSIS — K8020 Calculus of gallbladder without cholecystitis without obstruction: Secondary | ICD-10-CM | POA: Diagnosis not present

## 2023-05-26 DIAGNOSIS — R748 Abnormal levels of other serum enzymes: Secondary | ICD-10-CM | POA: Insufficient documentation

## 2023-05-26 DIAGNOSIS — R7989 Other specified abnormal findings of blood chemistry: Secondary | ICD-10-CM | POA: Diagnosis not present

## 2023-05-27 ENCOUNTER — Other Ambulatory Visit: Payer: Self-pay | Admitting: Family

## 2023-05-27 DIAGNOSIS — K760 Fatty (change of) liver, not elsewhere classified: Secondary | ICD-10-CM | POA: Insufficient documentation

## 2023-05-28 ENCOUNTER — Other Ambulatory Visit: Payer: Self-pay | Admitting: Family Medicine

## 2023-05-29 NOTE — Telephone Encounter (Signed)
Sent patient MyChart message to check on quantity received on 05/06/2023.

## 2023-06-19 NOTE — Progress Notes (Signed)
[facility] [address] [place] [address] Phone: [phone] Subjective:    I'm seeing this patient by the request  of:  Angela Spencer, FNP  CC: back pain   [id number]  05/06/2023 Does have spondylolisthesis noted.  We discussed with patient continuing to have the radicular symptoms and likely signs and symptoms most consistent with the spondylolisthesis and spinal stenosis contributing to the discomfort and pain.  At this point we will consider the possibility of advanced imaging if worsening symptoms.  Will start patient on Cymbalta as well as continue on the gabapentin which patient was only taking in seldomly.  If patient does well with this and conservative therapy we will continue to monitor.  If worsening symptoms advanced imaging would be warranted.  Will follow-up again 6 to 8 weeks     Updated 06/24/2023 Angela Curry is a 72 y.o. female coming in with complaint of back pain follow up. Patient states she is at a pain level 2 now, if it gets cold it can get to a 3 overal feels much better.    Patient did have x-rays taken in September that showed patient did have scoliosis as well as a grade 1 anterolisthesis of L4 on 5 L5.   Patient did have a ultrasound of the abdomen done showing the patient did have cholelithiasis but no acute findings.  Past Medical History:  Diagnosis Date   Asthma    "light asthma after pneumonia about 15 years ago"   Dyslipidemia    Dysphagia    Fibrocystic breast disease    GERD (gastroesophageal reflux disease)    H/O cold sores    Hiatal hernia    Osteopenia    Psoriasis    Vitamin D deficiency    Past Surgical History:  Procedure Laterality Date   COLONOSCOPY     TONSILLECTOMY     TUBAL LIGATION     UPPER GASTROINTESTINAL ENDOSCOPY     Social History   Socioeconomic History   Marital status: Divorced    Spouse name: Not on file   Number of children: Not on file   Years of  education: Not on file   Highest education level: Associate degree: academic program  Occupational History   Not on file  Tobacco Use   Smoking status: Never   Smokeless tobacco: Never  Vaping Use   Vaping status: Never Used  Substance and Sexual Activity   Alcohol use: Yes    Comment: few days a week   Drug use: No   Sexual activity: Not on file  Other Topics Concern   Not on file  Social History Narrative   Lives alone.  She is divorced and has 2 children and 5 grands.     Social Determinants of Health   Financial Resource Strain: Low Risk  (05/08/2023)   Overall Financial Resource Strain (CARDIA)    Difficulty of Paying Living Expenses: Not hard at all  Food Insecurity: No Food Insecurity (05/08/2023)   Hunger Vital Sign    Worried About Running Out of Food in the Last Year: Never true    Ran Out of Food in the Last Year: Never true  Transportation Needs: No Transportation Needs (05/08/2023)   PRAPARE - Administrator, Civil Service (Medical): No    Lack of Transportation (Non-Medical): No  Physical Activity: Insufficiently Active (05/08/2023)   Exercise Vital Sign    Days of Exercise per Week: 2 days  Minutes of Exercise per Session: 10 min  Stress: No Stress Concern Present (05/08/2023)   [employer] of Occupational Health - Occupational Stress Questionnaire    Feeling of Stress : Only a little  Social Connections: Unknown (05/08/2023)   Social Connection and Isolation Panel [NHANES]    Frequency of Communication with Friends and Family: More than three times a week    Frequency of Social Gatherings with Friends and Family: Patient declined    Attends Religious Services: Patient declined    Database administrator or Organizations: No    Attends Engineer, structural: Not on file    Marital Status: Not on file   Allergies  Allergen Reactions   Amoxicillin Other (See Comments)   Family History  Problem Relation Age of Onset    Diabetes Mother    Heart disease Father 65       Heart attack   Drug abuse Father    Early death Father    Cancer Paternal Aunt        breast   Breast cancer Paternal Aunt    Cancer Paternal Aunt        breast   Breast cancer Paternal Aunt    Esophageal cancer Neg Hx    Colon cancer Neg Hx    Stomach cancer Neg Hx    Rectal cancer Neg Hx       Current Outpatient Medications (Respiratory):    fluticasone (FLONASE) 50 MCG/ACT nasal spray, Place 2 sprays into both nostrils daily.   Fluticasone-Salmeterol (ADVAIR DISKUS) 100-50 MCG/DOSE AEPB, Inhale 1 puff into the lungs in the morning and at bedtime. (Patient taking differently: Inhale 1 puff into the lungs 2 (two) times daily as needed.)  Current Outpatient Medications (Analgesics):    meloxicam (MOBIC) 15 MG tablet, Take 1 tablet (15 mg total) by mouth daily.   Current Outpatient Medications (Other):    Cholecalciferol 50 MCG (2000 UT) TABS, 1 tablet Orally Once a day   clobetasol ointment (TEMOVATE) 0.05 %, Apply topically as needed.   Dexlansoprazole 30 MG capsule DR, TAKE ONE CAPSULE BY MOUTH DAILY (NEEDS TO BE SEEN BEFORE NEXT REFILL)   DULoxetine (CYMBALTA) 20 MG capsule, Take 1 capsule (20 mg total) by mouth daily.   gabapentin (NEURONTIN) 100 MG capsule, Take 2 capsules (200 mg total) by mouth at bedtime.   linaclotide (LINZESS) 145 MCG CAPS capsule, Take 1 capsule (145 mcg total) by mouth daily before breakfast. (NEEDS TO BE SEEN BEFORE NEXT REFILL)   metroNIDAZOLE (METROCREAM) 0.75 % cream, Apply topically daily.   Vitamin D, Ergocalciferol, (DRISDOL) 1.25 MG (50000 UNIT) CAPS capsule, Take 1 capsule (50,000 Units total) by mouth every 7 (seven) days.   Reviewed prior external information including notes and imaging from  primary care provider As well as notes that were available from care everywhere and other healthcare systems.  Past medical history, social, surgical and family history all reviewed in electronic  medical record.  No pertanent information unless stated regarding to the chief complaint.    Objective  Blood pressure 130/84, pulse 95, height 5\' 2"  (1.575 m), SpO2 98%.   General: No apparent distress alert and oriented x3 mood and affect normal, dressed appropriately.  HEENT: Pupils equal, extraocular movements intact  Respiratory: Patient's speak in full sentences and does not appear short of breath  Cardiovascular: No lower extremity edema, non tender, no erythema  Back exam shows minimal pain on exam  Sitting comfortably today.  Able to get out  of a seated chair without any significant difficulty.     Impression and Recommendations:    The above documentation has been reviewed and is accurate and complete Judi Saa, DO

## 2023-06-24 ENCOUNTER — Ambulatory Visit: Payer: BC Managed Care – PPO | Admitting: Family Medicine

## 2023-06-24 ENCOUNTER — Encounter: Payer: Self-pay | Admitting: Family Medicine

## 2023-06-24 VITALS — BP 130/84 | HR 95 | Ht 62.0 in

## 2023-06-24 DIAGNOSIS — M5416 Radiculopathy, lumbar region: Secondary | ICD-10-CM

## 2023-06-24 NOTE — Assessment & Plan Note (Signed)
Patient should be doing relatively well at the moment.  Discussed icing regimen and home exercises.  Note no significant change and seems to be doing well with the 20 mg of Cymbalta daily and the 200 mg of gabapentin at night.  Increase activity as tolerated.  Follow-up with me again in 2 to 3 months

## 2023-06-24 NOTE — Patient Instructions (Signed)
Good to see you  Thank you for making it easy  I will check into the muscle twitching No changes in medicine Follow up in 3 months

## 2023-08-01 ENCOUNTER — Other Ambulatory Visit: Payer: Self-pay | Admitting: Family Medicine

## 2023-08-05 ENCOUNTER — Ambulatory Visit: Payer: BC Managed Care – PPO | Admitting: Sports Medicine

## 2023-08-05 VITALS — BP 120/80 | HR 73 | Ht 62.0 in | Wt 233.0 lb

## 2023-08-05 DIAGNOSIS — M5442 Lumbago with sciatica, left side: Secondary | ICD-10-CM

## 2023-08-05 DIAGNOSIS — Z872 Personal history of diseases of the skin and subcutaneous tissue: Secondary | ICD-10-CM

## 2023-08-05 DIAGNOSIS — M2550 Pain in unspecified joint: Secondary | ICD-10-CM

## 2023-08-05 DIAGNOSIS — G8929 Other chronic pain: Secondary | ICD-10-CM

## 2023-08-05 DIAGNOSIS — M51362 Other intervertebral disc degeneration, lumbar region with discogenic back pain and lower extremity pain: Secondary | ICD-10-CM | POA: Diagnosis not present

## 2023-08-05 LAB — CBC WITH DIFFERENTIAL/PLATELET
Basophils Absolute: 0 10*3/uL (ref 0.0–0.1)
Basophils Relative: 0.6 % (ref 0.0–3.0)
Eosinophils Absolute: 0.1 10*3/uL (ref 0.0–0.7)
Eosinophils Relative: 1 % (ref 0.0–5.0)
HCT: 42.2 % (ref 36.0–46.0)
Hemoglobin: 13.6 g/dL (ref 12.0–15.0)
Lymphocytes Relative: 22.1 % (ref 12.0–46.0)
Lymphs Abs: 1.1 10*3/uL (ref 0.7–4.0)
MCHC: 32.3 g/dL (ref 30.0–36.0)
MCV: 85.9 fL (ref 78.0–100.0)
Monocytes Absolute: 0.5 10*3/uL (ref 0.1–1.0)
Monocytes Relative: 11 % (ref 3.0–12.0)
Neutro Abs: 3.2 10*3/uL (ref 1.4–7.7)
Neutrophils Relative %: 65.3 % (ref 43.0–77.0)
Platelets: 302 10*3/uL (ref 150.0–400.0)
RBC: 4.91 Mil/uL (ref 3.87–5.11)
RDW: 14 % (ref 11.5–15.5)
WBC: 4.9 10*3/uL (ref 4.0–10.5)

## 2023-08-05 LAB — URIC ACID: Uric Acid, Serum: 6.8 mg/dL (ref 2.4–7.0)

## 2023-08-05 LAB — VITAMIN D 25 HYDROXY (VIT D DEFICIENCY, FRACTURES): VITD: 35.33 ng/mL (ref 30.00–100.00)

## 2023-08-05 LAB — C-REACTIVE PROTEIN: CRP: 1.3 mg/dL (ref 0.5–20.0)

## 2023-08-05 LAB — TSH: TSH: 1.81 u[IU]/mL (ref 0.35–5.50)

## 2023-08-05 LAB — SEDIMENTATION RATE: Sed Rate: 41 mm/h — ABNORMAL HIGH (ref 0–30)

## 2023-08-05 LAB — FERRITIN: Ferritin: 63.5 ng/mL (ref 10.0–291.0)

## 2023-08-05 NOTE — Progress Notes (Signed)
[facility] [address] [place] [address] Phone: [phone]   Assessment and Plan:     1. Degeneration of intervertebral disc of lumbar region with discogenic back pain and lower extremity pain 2. Chronic bilateral low back pain with left-sided sciatica -Chronic with exacerbation, subsequent visit - Consistent with lumbar DDD with radiculopathy with worsening low back pain with left-sided radicular symptoms - Due to failure to improve with >6 weeks of conservative therapy, pain frequently >6/10, pain affecting day-to-day activities, recommend further evaluation with lumbar MRI - May continue gabapentin and duloxetine - Restart meloxicam 15 mg daily for 2 weeks  3. Polyarthralgia 4. History of psoriasis  -Chronic with exacerbation, initial visit - Unclear etiology of polyarthralgia with patient complaining of bilateral shoulder, bilateral wrist, left leg, left knee, left hip, back, neck pain that has been migratory over the past several weeks with past medical history of psoriasis and malar type facial rash present - Recommend further evaluation with autoimmune lab work.  Orders placed today  Pertinent previous records reviewed include none  Follow Up: 5 days after MRI to review results and discuss treatment plan.  Could review lab work at that time   Subjective:   I, Angela Curry, am serving as a Neurosurgeon for Doctor [employer]  Chief Complaint: bilat  shoulder and left leg pain and numbness   HPI:   05/06/2023 Does have spondylolisthesis noted.  We discussed with patient continuing to have the radicular symptoms and likely signs and symptoms most consistent with the spondylolisthesis and spinal stenosis contributing to the discomfort and pain.  At this point we will consider the possibility of advanced imaging if worsening symptoms.  Will start patient on Cymbalta as well as continue on the gabapentin which patient  was only taking in seldomly.  If patient does well with this and conservative therapy we will continue to monitor.  If worsening symptoms advanced imaging would be warranted.  Will follow-up again 6 to 8 weeks    Updated 06/24/2023 Angela Curry is a 73 y.o. female coming in with complaint of back pain follow up. Patient states she is at a pain level 2 now, if it gets cold it can get to a 3 overal feels much better.      Patient did have x-rays taken in September that showed patient did have scoliosis as well as a grade 1 anterolisthesis of L4 on 5 L5.   Patient did have a ultrasound of the abdomen done showing the patient did have cholelithiasis but no acute findings.  08/05/23 Patient states states she has migrating body pain. Her left leg is the scariest , hx of sciatic nerve pain. She has severe pain when she walks she drags her leg by the end of the day... that has happened twice. She feels like she has a tennis ball in her hamstring when she sits. PRN tylenol due to acid reflux she has taken it twice. Her pain is affected by the weather she had body pain she isnt able to sleep   Relevant Historical Information: GERD, psoriasis, facial rash  Additional pertinent review of systems negative.   Current Outpatient Medications:    Cholecalciferol 50 MCG (2000 UT) TABS, 1 tablet Orally Once a day, Disp: , Rfl:    clobetasol ointment (TEMOVATE) 0.05 %, Apply topically as needed., Disp: 60 g, Rfl: 2   Dexlansoprazole 30 MG capsule DR, TAKE ONE CAPSULE BY MOUTH DAILY (NEEDS TO BE  SEEN BEFORE NEXT REFILL), Disp: 90 capsule, Rfl: 3   DULoxetine (CYMBALTA) 20 MG capsule, TAKE 1 CAPSULE(20 MG) BY MOUTH DAILY, Disp: 90 capsule, Rfl: 0   fluticasone (FLONASE) 50 MCG/ACT nasal spray, Place 2 sprays into both nostrils daily., Disp: 16 g, Rfl: 6   gabapentin (NEURONTIN) 100 MG capsule, Take 2 capsules (200 mg total) by mouth at bedtime., Disp: 180 capsule, Rfl: 0   linaclotide (LINZESS) 145 MCG  CAPS capsule, Take 1 capsule (145 mcg total) by mouth daily before breakfast. (NEEDS TO BE SEEN BEFORE NEXT REFILL), Disp: 90 capsule, Rfl: 3   meloxicam (MOBIC) 15 MG tablet, Take 1 tablet (15 mg total) by mouth daily., Disp: 90 tablet, Rfl: 3   metroNIDAZOLE (METROCREAM) 0.75 % cream, Apply topically daily., Disp: , Rfl:    Vitamin D, Ergocalciferol, (DRISDOL) 1.25 MG (50000 UNIT) CAPS capsule, Take 1 capsule (50,000 Units total) by mouth every 7 (seven) days., Disp: 12 capsule, Rfl: 3   Fluticasone-Salmeterol (ADVAIR DISKUS) 100-50 MCG/DOSE AEPB, Inhale 1 puff into the lungs in the morning and at bedtime. (Patient taking differently: Inhale 1 puff into the lungs 2 (two) times daily as needed.), Disp: 60 each, Rfl: 2   Objective:     Vitals:   08/05/23 0945  BP: 120/80  Pulse: 73  SpO2: 100%  Weight: 233 lb (105.7 kg)  Height: 5\' 2"  (1.575 m)      Body mass index is 42.62 kg/m.    Physical Exam:    Gen: Appears well, nad, nontoxic and pleasant Psych: Alert and oriented, appropriate mood and affect Neuro: sensation intact, strength is 5/5 in upper and lower extremities, muscle tone wnl Skin: no susupicious lesions or rashes  Back - Normal skin, Spine with normal alignment and no deformity.     tenderness to lumbar vertebral process palpation.   Bilateral lumbar paraspinous muscles are   tender and without spasm NTTP gluteal musculature Straight leg raise negative right, positive left  Gait antalgic, favoring right leg   Electronically signed by:  [facility] 10:21 AM 08/05/23

## 2023-08-05 NOTE — Patient Instructions (Addendum)
Labs on the way out  - Start meloxicam 15 mg daily x2 weeks.  If still having pain after 2 weeks, complete 3rd-week of NSAID. May use remaining NSAID as needed once daily for pain control.  Do not to use additional over-the-counter NSAIDs (ibuprofen, naproxen, Advil, Aleve) while taking prescription NSAIDs.  May use Tylenol [phone] mg 2 to 3 times a day for breakthrough pain. Lumbar MRI referral  Follow up 5 days after MRI to discuss results

## 2023-08-07 LAB — FANA STAINING PATTERNS: Homogeneous Pattern: 1:640 {titer} — ABNORMAL HIGH

## 2023-08-07 LAB — ANA+ENA+DNA/DS+SCL 70+SJOSSA/B
ANA Titer 1: POSITIVE — AB
ENA RNP Ab: <0.2 AI (ref 0.0–0.9)
ENA SM Ab Ser-aCnc: <0.2 AI (ref 0.0–0.9)
ENA SSA (RO) Ab: <0.2 AI (ref 0.0–0.9)
ENA SSB (LA) Ab: <0.2 AI (ref 0.0–0.9)
Scleroderma (Scl-70) (ENA) Antibody, IgG: <0.2 AI (ref 0.0–0.9)
dsDNA Ab: <1 [IU]/mL (ref 0–9)

## 2023-08-10 LAB — CYCLIC CITRUL PEPTIDE ANTIBODY, IGG: Cyclic Citrullin Peptide Ab: <16 U

## 2023-08-10 LAB — RHEUMATOID FACTOR: Rheumatoid fact SerPl-aCnc: <10 [IU]/mL (ref <14)

## 2023-08-11 ENCOUNTER — Encounter: Payer: Self-pay | Admitting: Sports Medicine

## 2023-08-11 NOTE — Progress Notes (Signed)
Action items: I recommend that patient follow-up with rheumatology to review positive ANA and discuss if any further lab work or treatment is warranted.  Attempted to call patient's cell phone, which went to voicemail.  Left a message that patient can check MyChart for this message reviewing lab work.    Patient's lab work showed a positive ANA with titer 1: 640 which represents a significantly positive ANA.  Patient has past medical of psoriasis which could possibly explain positive ANA, however with continued symptoms, I would recommend reestablishing with rheumatology to see if any further workup or treatment is warranted.  Sed rate was also elevated which is a screening lab that indicates general inflammation in the body.  The remainder of lab work was unremarkable.

## 2023-08-12 NOTE — Telephone Encounter (Signed)
Pt called, she has contacted her rheumatologist about the results and at her request I have faxed lab results from 08/05/2023.

## 2023-08-14 DIAGNOSIS — M79642 Pain in left hand: Secondary | ICD-10-CM | POA: Diagnosis not present

## 2023-08-14 DIAGNOSIS — Z6841 Body Mass Index (BMI) 40.0 and over, adult: Secondary | ICD-10-CM | POA: Diagnosis not present

## 2023-08-14 DIAGNOSIS — M79641 Pain in right hand: Secondary | ICD-10-CM | POA: Diagnosis not present

## 2023-08-14 DIAGNOSIS — L409 Psoriasis, unspecified: Secondary | ICD-10-CM | POA: Diagnosis not present

## 2023-08-14 DIAGNOSIS — M25512 Pain in left shoulder: Secondary | ICD-10-CM | POA: Diagnosis not present

## 2023-08-14 DIAGNOSIS — M25511 Pain in right shoulder: Secondary | ICD-10-CM | POA: Diagnosis not present

## 2023-08-14 DIAGNOSIS — M1991 Primary osteoarthritis, unspecified site: Secondary | ICD-10-CM | POA: Diagnosis not present

## 2023-08-25 IMAGING — DX DG ABDOMEN 1V
2 series · 2 of 2 positions shown · non-contrast
Comparison: 03/03/2013

CLINICAL DATA: Abdomen pain

EXAM:
ABDOMEN - 1 VIEW

[abdomen kub (1 of 2)]
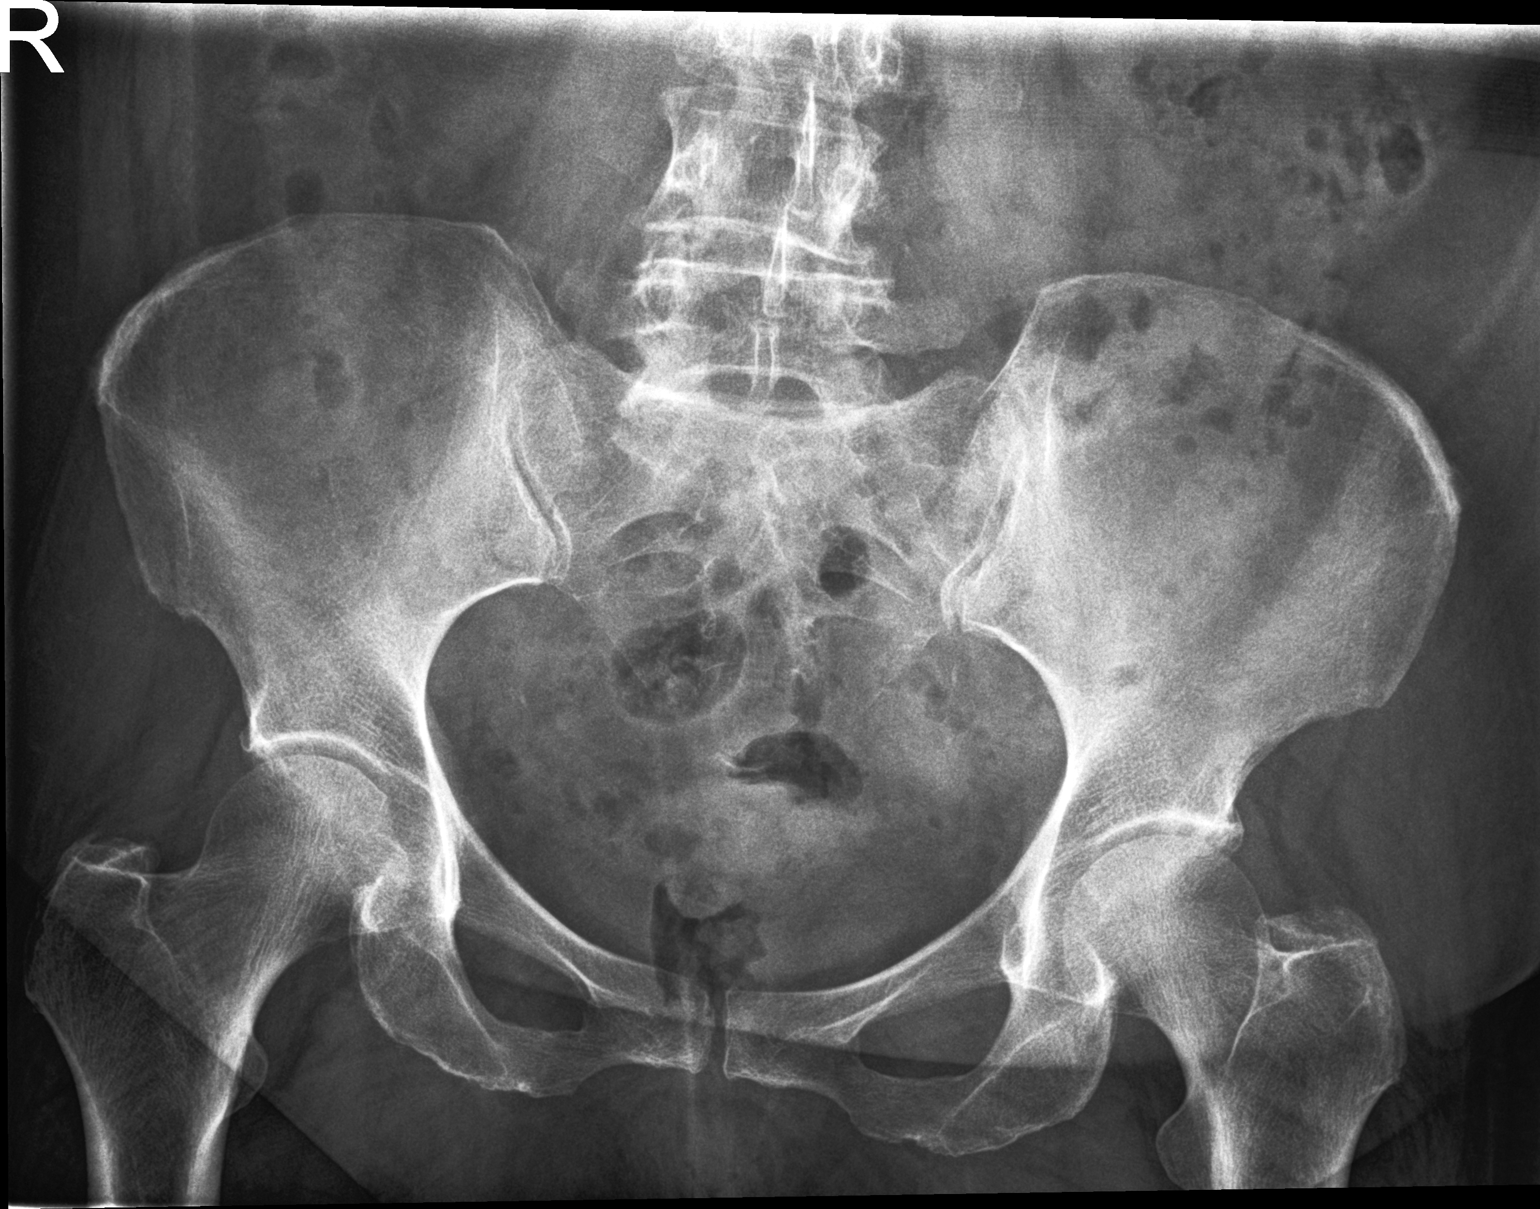

[abdomen kub (2 of 2)]
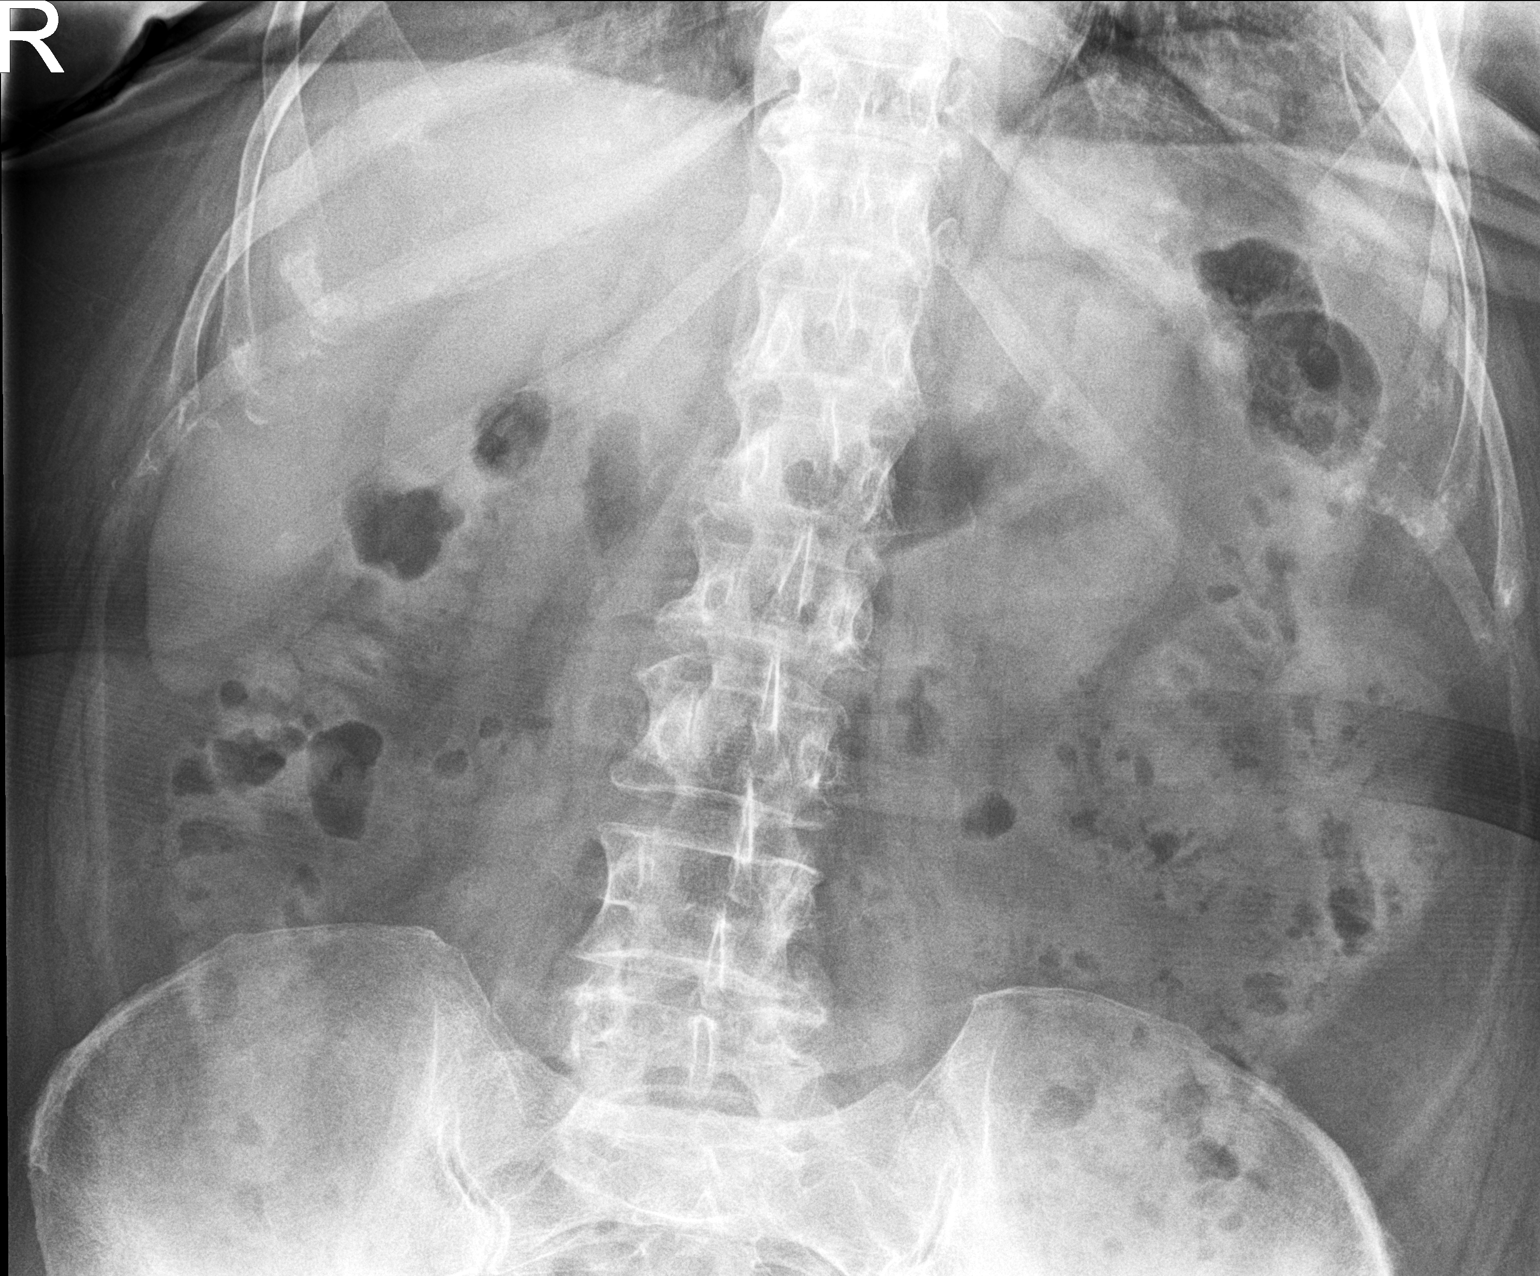

[2 of 2 positions shown; findings below may reference images not displayed]

FINDINGS: The bowel gas pattern is normal. No radio-opaque calculi or other
significant radiographic abnormality are seen. Moderate stool.
IMPRESSION: Negative.

## 2023-08-26 ENCOUNTER — Other Ambulatory Visit: Payer: BC Managed Care – PPO

## 2023-08-27 ENCOUNTER — Other Ambulatory Visit: Payer: Self-pay | Admitting: Family Medicine

## 2023-08-28 ENCOUNTER — Ambulatory Visit
Admission: RE | Admit: 2023-08-28 | Discharge: 2023-08-28 | Disposition: A | Discharge status: Home / Self Care | Payer: BC Managed Care – PPO | Source: Ambulatory Visit | Attending: Sports Medicine | Admitting: Sports Medicine

## 2023-08-28 DIAGNOSIS — M51362 Other intervertebral disc degeneration, lumbar region with discogenic back pain and lower extremity pain: Secondary | ICD-10-CM

## 2023-08-28 DIAGNOSIS — M4316 Spondylolisthesis, lumbar region: Secondary | ICD-10-CM | POA: Diagnosis not present

## 2023-08-28 DIAGNOSIS — M47816 Spondylosis without myelopathy or radiculopathy, lumbar region: Secondary | ICD-10-CM | POA: Diagnosis not present

## 2023-09-01 NOTE — Progress Notes (Unsigned)
 [facility] [address] [place] [address] Phone: [phone]   Assessment and Plan:     There are no diagnoses linked to this encounter.  ***   Pertinent previous records reviewed include ***    Follow Up: ***     Subjective:   I, Angela Curry, am serving as a Neurosurgeon for Doctor [employer]   Chief Complaint: bilat  shoulder and left leg pain and numbness    HPI:    05/06/2023 Does have spondylolisthesis noted.  We discussed with patient continuing to have the radicular symptoms and likely signs and symptoms most consistent with the spondylolisthesis and spinal stenosis contributing to the discomfort and pain.  At this point we will consider the possibility of advanced imaging if worsening symptoms.  Will start patient on Cymbalta as well as continue on the gabapentin which patient was only taking in seldomly.  If patient does well with this and conservative therapy we will continue to monitor.  If worsening symptoms advanced imaging would be warranted.  Will follow-up again 6 to 8 weeks     Updated 06/24/2023 Angela Curry is a 73 y.o. female coming in with complaint of back pain follow up. Patient states she is at a pain level 2 now, if it gets cold it can get to a 3 overal feels much better.      Patient did have x-rays taken in September that showed patient did have scoliosis as well as a grade 1 anterolisthesis of L4 on 5 L5.   Patient did have a ultrasound of the abdomen done showing the patient did have cholelithiasis but no acute findings.   08/05/23 Patient states states she has migrating body pain. Her left leg is the scariest , hx of sciatic nerve pain. She has severe pain when she walks she drags her leg by the end of the day... that has happened twice. She feels like she has a tennis ball in her hamstring when she sits. PRN tylenol due to acid reflux she has taken it twice. Her pain is affected  by the weather she had body pain she isnt able to sleep   09/02/2023 Patient states   Relevant Historical Information: GERD, psoriasis, facial rash  Additional pertinent review of systems negative.   Current Outpatient Medications:    Cholecalciferol 50 MCG (2000 UT) TABS, 1 tablet Orally Once a day, Disp: , Rfl:    clobetasol ointment (TEMOVATE) 0.05 %, Apply topically as needed., Disp: 60 g, Rfl: 2   Dexlansoprazole 30 MG capsule DR, TAKE ONE CAPSULE BY MOUTH DAILY (NEEDS TO BE SEEN BEFORE NEXT REFILL), Disp: 90 capsule, Rfl: 3   DULoxetine (CYMBALTA) 20 MG capsule, TAKE 1 CAPSULE(20 MG) BY MOUTH DAILY, Disp: 90 capsule, Rfl: 0   fluticasone (FLONASE) 50 MCG/ACT nasal spray, Place 2 sprays into both nostrils daily., Disp: 16 g, Rfl: 6   Fluticasone-Salmeterol (ADVAIR DISKUS) 100-50 MCG/DOSE AEPB, Inhale 1 puff into the lungs in the morning and at bedtime. (Patient taking differently: Inhale 1 puff into the lungs 2 (two) times daily as needed.), Disp: 60 each, Rfl: 2   gabapentin (NEURONTIN) 100 MG capsule, TAKE 2 CAPSULES(200 MG) BY MOUTH AT BEDTIME, Disp: 180 capsule, Rfl: 0   linaclotide (LINZESS) 145 MCG CAPS capsule, Take 1 capsule (145 mcg total) by mouth daily before breakfast. (NEEDS TO BE SEEN BEFORE NEXT REFILL), Disp: 90 capsule, Rfl: 3   meloxicam (MOBIC) 15 MG tablet, Take  1 tablet (15 mg total) by mouth daily., Disp: 90 tablet, Rfl: 3   metroNIDAZOLE (METROCREAM) 0.75 % cream, Apply topically daily., Disp: , Rfl:    Vitamin D, Ergocalciferol, (DRISDOL) 1.25 MG (50000 UNIT) CAPS capsule, Take 1 capsule (50,000 Units total) by mouth every 7 (seven) days., Disp: 12 capsule, Rfl: 3   Objective:     There were no vitals filed for this visit.    There is no height or weight on file to calculate BMI.    Physical Exam:    ***   Electronically signed by:  Angela [facility] 7:57 AM 09/01/23

## 2023-09-02 ENCOUNTER — Ambulatory Visit: Payer: BC Managed Care – PPO | Admitting: Sports Medicine

## 2023-09-02 VITALS — HR 93 | Ht 62.0 in | Wt 232.0 lb

## 2023-09-02 DIAGNOSIS — M2550 Pain in unspecified joint: Secondary | ICD-10-CM

## 2023-09-02 DIAGNOSIS — G8929 Other chronic pain: Secondary | ICD-10-CM | POA: Diagnosis not present

## 2023-09-02 DIAGNOSIS — M51362 Other intervertebral disc degeneration, lumbar region with discogenic back pain and lower extremity pain: Secondary | ICD-10-CM

## 2023-09-02 DIAGNOSIS — Z872 Personal history of diseases of the skin and subcutaneous tissue: Secondary | ICD-10-CM | POA: Diagnosis not present

## 2023-09-02 DIAGNOSIS — R768 Other specified abnormal immunological findings in serum: Secondary | ICD-10-CM | POA: Diagnosis not present

## 2023-09-12 DIAGNOSIS — H25813 Combined forms of age-related cataract, bilateral: Secondary | ICD-10-CM | POA: Diagnosis not present

## 2023-09-12 DIAGNOSIS — H35371 Puckering of macula, right eye: Secondary | ICD-10-CM | POA: Diagnosis not present

## 2023-09-23 ENCOUNTER — Ambulatory Visit: Payer: BC Managed Care – PPO | Admitting: Family Medicine

## 2023-09-24 DIAGNOSIS — M79641 Pain in right hand: Secondary | ICD-10-CM | POA: Diagnosis not present

## 2023-09-24 DIAGNOSIS — M1991 Primary osteoarthritis, unspecified site: Secondary | ICD-10-CM | POA: Diagnosis not present

## 2023-09-24 DIAGNOSIS — M79642 Pain in left hand: Secondary | ICD-10-CM | POA: Diagnosis not present

## 2023-09-24 DIAGNOSIS — L409 Psoriasis, unspecified: Secondary | ICD-10-CM | POA: Diagnosis not present

## 2023-10-06 DIAGNOSIS — H2513 Age-related nuclear cataract, bilateral: Secondary | ICD-10-CM | POA: Diagnosis not present

## 2023-10-06 DIAGNOSIS — H25013 Cortical age-related cataract, bilateral: Secondary | ICD-10-CM | POA: Diagnosis not present

## 2023-10-06 DIAGNOSIS — H40013 Open angle with borderline findings, low risk, bilateral: Secondary | ICD-10-CM | POA: Diagnosis not present

## 2023-10-06 DIAGNOSIS — H25043 Posterior subcapsular polar age-related cataract, bilateral: Secondary | ICD-10-CM | POA: Diagnosis not present

## 2023-10-06 DIAGNOSIS — H2511 Age-related nuclear cataract, right eye: Secondary | ICD-10-CM | POA: Diagnosis not present

## 2023-10-13 ENCOUNTER — Other Ambulatory Visit: Payer: Self-pay | Admitting: Family

## 2023-10-28 ENCOUNTER — Other Ambulatory Visit: Payer: Self-pay | Admitting: Family Medicine

## 2023-10-31 ENCOUNTER — Other Ambulatory Visit: Payer: Self-pay | Admitting: Family

## 2023-10-31 DIAGNOSIS — K5909 Other constipation: Secondary | ICD-10-CM

## 2023-10-31 DIAGNOSIS — K219 Gastro-esophageal reflux disease without esophagitis: Secondary | ICD-10-CM

## 2023-11-15 ENCOUNTER — Other Ambulatory Visit: Payer: Self-pay | Admitting: Family

## 2023-11-15 DIAGNOSIS — K5909 Other constipation: Secondary | ICD-10-CM

## 2023-12-02 ENCOUNTER — Telehealth: Payer: Self-pay

## 2023-12-02 NOTE — Telephone Encounter (Signed)
 Spoke with pt and advised she is past due for chest CT due to pulmonary nodule.  Pt states she is scheduled for cataract surgery in June but would like to go ahead and schedule.  Pt advised will forward to navigators for assistance with scheduling.  Pt verbalizes understanding and agrees with current plan.

## 2023-12-04 ENCOUNTER — Encounter: Payer: Self-pay | Admitting: Family

## 2023-12-04 ENCOUNTER — Ambulatory Visit: Admitting: Family

## 2023-12-04 VITALS — BP 135/84 | HR 89 | Temp 97.5°F | Resp 18 | Ht 62.0 in | Wt 220.0 lb

## 2023-12-04 DIAGNOSIS — Z0000 Encounter for general adult medical examination without abnormal findings: Secondary | ICD-10-CM | POA: Diagnosis not present

## 2023-12-04 DIAGNOSIS — M5442 Lumbago with sciatica, left side: Secondary | ICD-10-CM

## 2023-12-04 DIAGNOSIS — R5383 Other fatigue: Secondary | ICD-10-CM

## 2023-12-04 DIAGNOSIS — G8929 Other chronic pain: Secondary | ICD-10-CM

## 2023-12-04 DIAGNOSIS — R6889 Other general symptoms and signs: Secondary | ICD-10-CM | POA: Diagnosis not present

## 2023-12-04 DIAGNOSIS — K5909 Other constipation: Secondary | ICD-10-CM

## 2023-12-04 DIAGNOSIS — E785 Hyperlipidemia, unspecified: Secondary | ICD-10-CM | POA: Diagnosis not present

## 2023-12-04 DIAGNOSIS — K219 Gastro-esophageal reflux disease without esophagitis: Secondary | ICD-10-CM | POA: Diagnosis not present

## 2023-12-04 DIAGNOSIS — J4520 Mild intermittent asthma, uncomplicated: Secondary | ICD-10-CM

## 2023-12-04 DIAGNOSIS — Z0001 Encounter for general adult medical examination with abnormal findings: Secondary | ICD-10-CM

## 2023-12-04 DIAGNOSIS — L409 Psoriasis, unspecified: Secondary | ICD-10-CM

## 2023-12-04 DIAGNOSIS — M797 Fibromyalgia: Secondary | ICD-10-CM

## 2023-12-04 DIAGNOSIS — M25511 Pain in right shoulder: Secondary | ICD-10-CM

## 2023-12-04 DIAGNOSIS — Z6841 Body Mass Index (BMI) 40.0 and over, adult: Secondary | ICD-10-CM

## 2023-12-04 MED ORDER — DEXLANSOPRAZOLE 30 MG PO CPDR
1.0000 | DELAYED_RELEASE_CAPSULE | Freq: Every day | ORAL | 3 refills | Status: AC
Start: 2023-12-04 — End: ?

## 2023-12-04 MED ORDER — DULOXETINE HCL 30 MG PO CPEP: 30.0000 mg | ORAL_CAPSULE | Freq: Every day | ORAL | 1 refills | Status: DC

## 2023-12-04 MED ORDER — MELOXICAM 15 MG PO TABS: 15.0000 mg | ORAL_TABLET | Freq: Every day | ORAL | 3 refills | Status: DC

## 2023-12-04 MED ORDER — BACLOFEN 10 MG PO TABS: 10.0000 mg | ORAL_TABLET | Freq: Three times a day (TID) | ORAL | 2 refills | Status: DC

## 2023-12-04 MED ORDER — LINACLOTIDE 145 MCG PO CAPS
145.0000 ug | ORAL_CAPSULE | Freq: Every day | ORAL | 3 refills | Status: AC
Start: 2023-12-04 — End: ?

## 2023-12-04 NOTE — Patient Instructions (Signed)
Myofascial Pain Syndrome and Fibromyalgia Myofascial pain syndrome and fibromyalgia are both pain disorders. You may feel this pain mainly in your muscles. Myofascial pain syndrome: Always has tender points in the muscles that will cause pain when pressed (trigger points). The pain may come and go. Usually affects your neck, upper back, and shoulder areas. The pain often moves into your arms and hands. Fibromyalgia: Has muscle pains and tenderness that come and go. Is often associated with tiredness (fatigue) and sleep problems. Has trigger points. Tends to be long-lasting (chronic), but is not life-threatening. Fibromyalgia and myofascial pain syndrome are not the same. However, they often occur together. If you have both conditions, each can make the other worse. Both are common and can cause enough pain and fatigue to make day-to-day activities difficult. Both can be hard to diagnose because their symptoms are common in many other conditions. What are the causes? The exact causes of these conditions are not known. What increases the risk? You are more likely to develop either of these conditions if: You have a family history of the condition. You are female. You have certain triggers, such as: Spine disorders. An injury (trauma) or other physical stressors. Being under a lot of stress. Medical conditions such as osteoarthritis, rheumatoid arthritis, or lupus. What are the signs or symptoms? Fibromyalgia The main symptom of fibromyalgia is widespread pain and tenderness in your muscles. Pain is sometimes described as stabbing, shooting, or burning. You may also have: Tingling or numbness. Sleep problems and fatigue. Problems with attention and concentration (fibro fog). Other symptoms may include: Bowel and bladder problems. Headaches. Vision problems. Sensitivity to odors and noises. Depression or mood changes. Painful menstrual periods (dysmenorrhea). Dry skin or eyes. These  symptoms can vary over time. Myofascial pain syndrome Symptoms of myofascial pain syndrome include: Tight, ropy bands of muscle. Uncomfortable sensations in muscle areas. These may include aching, cramping, burning, numbness, tingling, and weakness. Difficulty moving certain parts of the body freely (poor range of motion). How is this diagnosed? This condition may be diagnosed by your symptoms and medical history. You will also have a physical exam. In general: Fibromyalgia is diagnosed if you have pain, fatigue, and other symptoms for more than 3 months, and symptoms cannot be explained by another condition. Myofascial pain syndrome is diagnosed if you have trigger points in your muscles, and those trigger points are tender and cause pain elsewhere in your body (referred pain). How is this treated? Treatment for these conditions depends on the type that you have. For fibromyalgia, a healthy lifestyle is the most important treatment including aerobic and strength exercises. Different types of medicines are used to help treat pain and include: NSAIDs. Medicines for treating depression. Medicines that help control seizures. Medicines that relax the muscles. Treatment for myofascial pain syndrome includes: Pain medicines, such as NSAIDs. Cooling and stretching of muscles. Massage therapy with myofascial release technique. Trigger point injections. Treating these conditions often requires a team of health care providers. These may include: Your primary care provider. A physical therapist. Complementary health care providers, such as massage therapists or acupuncturists. A psychiatrist for cognitive behavioral therapy. Follow these instructions at home: Medicines Take over-the-counter and prescription medicines only as told by your health care provider. Ask your health care provider if the medicine prescribed to you: Requires you to avoid driving or using machinery. Can cause constipation.  You may need to take these actions to prevent or treat constipation: Drink enough fluid to keep your urine pale   yellow. Take over-the-counter or prescription medicines. Eat foods that are high in fiber, such as beans, whole grains, and fresh fruits and vegetables. Limit foods that are high in fat and processed sugars, such as fried or sweet foods. Lifestyle  Do exercises as told by your health care provider or physical therapist. Practice relaxation techniques to control your stress. You may want to try: Biofeedback. Visual imagery. Hypnosis. Muscle relaxation. Yoga. Meditation. Maintain a healthy lifestyle. This includes eating a healthy diet and getting enough sleep. Do not use any products that contain nicotine or tobacco. These products include cigarettes, chewing tobacco, and vaping devices, such as e-cigarettes. If you need help quitting, ask your health care provider. General instructions Talk to your health care provider about complementary treatments, such as acupuncture or massage. Do not do activities that stress or strain your muscles. This includes repetitive motions and heavy lifting. Keep all follow-up visits. This is important. Where to find support Consider joining a support group with others who are diagnosed with this condition. National Fibromyalgia Association: fmaware.org Where to find more information U.S. Pain Foundation: uspainfoundation.org Contact a health care provider if: You have new symptoms. Your symptoms get worse or your pain is severe. You have side effects from your medicines. You have trouble sleeping. Your condition is causing depression or anxiety. Get help right away if: You have thoughts of hurting yourself or others. Get help right away if you feel like you may hurt yourself or others, or have thoughts about taking your own life. Go to your nearest emergency room or: Call 911. Call the National Suicide Prevention Lifeline at 1-800-273-8255  or 988. This is open 24 hours a day. Text the Crisis Text Line at 741741. This information is not intended to replace advice given to you by your health care provider. Make sure you discuss any questions you have with your health care provider. Document Revised: 04/08/2022 Document Reviewed: 06/01/2021 Elsevier Patient Education  2024 Elsevier Inc.  

## 2023-12-04 NOTE — Progress Notes (Signed)
 Subjective:    Patient ID: Angela Curry, female    DOB: June 22, 1951, 73 y.o.   MRN: [id number]  Chief Complaint  Patient presents with   Medical Management of Chronic Issues    Pt presents to the office today for CPE and chronic follow up.   Has morbid obese with a BMI of 40 and GERD and constipation.    She is followed by dermatologists every 6 months for psoriasis.Had negative RA panel.     She is followed by Ortho as needed for right shoulder injection and back pain. Complaining of right shoulder paint that radiates down her right arm.  Asthma She complains of hoarse voice, shortness of breath and wheezing. There is no difficulty breathing. This is a chronic problem. The current episode started more than 1 year ago. The problem occurs intermittently. Associated symptoms include heartburn. Her symptoms are alleviated by rest. Her past medical history is significant for asthma.  Gastroesophageal Reflux She complains of belching, heartburn, a hoarse voice and wheezing. This is a chronic problem. The current episode started more than 1 year ago. The problem occurs rarely. Risk factors include obesity. She has tried a PPI for the symptoms. The treatment provided moderate relief.  Constipation This is a chronic problem. The current episode started more than 1 year ago. The problem has been resolved since onset. Her stool frequency is 1 time per day. Associated symptoms include back pain. Risk factors include obesity. She has tried laxatives (linzess ) for the symptoms. The treatment provided moderate relief.  Back Pain This is a chronic problem. The current episode started more than 1 year ago. The problem occurs intermittently. The problem has been waxing and waning since onset. The pain is present in the lumbar spine. The pain is at a severity of 2/10. The pain is mild. Associated symptoms include numbness and tingling.  Hyperlipidemia This is a chronic problem. The current episode  started more than 1 year ago. The problem is uncontrolled. Recent lipid tests were reviewed and are high. Exacerbating diseases include obesity. Associated symptoms include shortness of breath. Current antihyperlipidemic treatment includes diet change. The current treatment provides no improvement of lipids. Risk factors for coronary artery disease include dyslipidemia, hypertension, a sedentary lifestyle and post-menopausal.  Shoulder Pain  The pain is present in the right shoulder and neck. This is a recurrent problem. The current episode started 1 to 4 weeks ago. There has been no history of extremity trauma. The problem occurs intermittently. The quality of the pain is described as aching. The pain is at a severity of 8/10. The pain is moderate. Associated symptoms include a limited range of motion, numbness, stiffness and tingling. She has tried rest and NSAIDS for the symptoms. The treatment provided mild relief.      Review of Systems  HENT:  Positive for hoarse voice.   Respiratory:  Positive for shortness of breath and wheezing.   Gastrointestinal:  Positive for constipation and heartburn.  Musculoskeletal:  Positive for back pain and stiffness.  Neurological:  Positive for tingling and numbness.  All other systems reviewed and are negative.      Objective:   Physical Exam Vitals reviewed.  Constitutional:      General: She is not in acute distress.    Appearance: She is well-developed. She is obese.  HENT:     Head: Normocephalic and atraumatic.     Right Ear: Tympanic membrane normal.     Left Ear: Tympanic membrane normal.  Eyes:  Pupils: Pupils are equal, round, and reactive to light.  Neck:     Thyroid : No thyromegaly.  Cardiovascular:     Rate and Rhythm: Normal rate and regular rhythm.     Heart sounds: Normal heart sounds. No murmur heard. Pulmonary:     Effort: Pulmonary effort is normal. No respiratory distress.     Breath sounds: Normal breath sounds. No  wheezing.  Abdominal:     General: Bowel sounds are normal. There is no distension.     Palpations: Abdomen is soft.     Tenderness: There is no abdominal tenderness.  Musculoskeletal:        General: No tenderness.     Cervical back: Normal range of motion and neck supple.     Right lower leg: No edema.     Left lower leg: No edema.     Comments: Full ROM of back   Skin:    General: Skin is warm and dry.  Neurological:     Mental Status: She is alert and oriented to person, place, and time.     Cranial Nerves: No cranial nerve deficit.     Deep Tendon Reflexes: Reflexes are normal and symmetric.     Comments: Mild right weakness of right arm and hand  Psychiatric:        Behavior: Behavior normal.        Thought Content: Thought content normal.        Judgment: Judgment normal.       BP 135/84   Pulse 89   Temp (!) 97.5 F (36.4 C) (Temporal)   Resp 18   Ht 5\' 2"  (1.575 m)   Wt 220 lb (99.8 kg)   SpO2 95%   BMI 40.24 kg/m      Assessment & Plan:  Angela Curry comes in today with chief complaint of Medical Management of Chronic Issues   Diagnosis and orders addressed:  1. Annual physical exam (Primary) - CMP14+EGFR - Lipid panel - Vitamin B12  2. Psoriasis - CMP14+EGFR  3. Hyperlipidemia, unspecified hyperlipidemia type - CMP14+EGFR - Lipid panel  4. Gastroesophageal reflux disease, unspecified whether esophagitis present - Dexlansoprazole  30 MG capsule DR; Take 1 capsule (30 mg total) by mouth daily.  Dispense: 90 capsule; Refill: 3 - CMP14+EGFR  5. Chronic constipation - linaclotide  (LINZESS ) 145 MCG CAPS capsule; Take 1 capsule (145 mcg total) by mouth daily before breakfast. TAKE 1 CAPSULE(145 MCG) BY MOUTH DAILY BEFORE BREAKFAST  Dispense: 90 capsule; Refill: 3 - CMP14+EGFR  6. Morbid obesity (HCC) - CMP14+EGFR  7. Mild intermittent asthma without complication  - CMP14+EGFR   8. Chronic left-sided low back pain with left-sided  sciatica - CMP14+EGFR  9. Fibromyalgia muscle pain Start Mobic  and baclofen   Increase Cymbalta  to 30 mg from 20 mg  - meloxicam  (MOBIC ) 15 MG tablet; Take 1 tablet (15 mg total) by mouth daily.  Dispense: 90 tablet; Refill: 3 - DULoxetine  (CYMBALTA ) 30 MG capsule; Take 1 capsule (30 mg total) by mouth daily.  Dispense: 90 capsule; Refill: 1 - baclofen  (LIORESAL ) 10 MG tablet; Take 1 tablet (10 mg total) by mouth 3 (three) times daily.  Dispense: 60 each; Refill: 2  10. Other fatigue - Vitamin B12  11. Acute pain of right shoulder   Labs pending Start Mobic  and baclofen  for flare up Increase Cymbalta  to 30 mg from 20 mg  Continue current medications  Health Maintenance reviewed Diet and exercise encouraged  Follow up plan: 6 months  Tommas Fragmin, FNP

## 2023-12-05 ENCOUNTER — Ambulatory Visit: Payer: Self-pay | Admitting: Family

## 2023-12-05 ENCOUNTER — Other Ambulatory Visit: Payer: Self-pay | Admitting: Family

## 2023-12-05 LAB — VITAMIN B12: Vitamin B-12: 538 pg/mL (ref 232–1245)

## 2023-12-05 LAB — CMP14+EGFR
ALT: 65 IU/L — ABNORMAL HIGH (ref 0–32)
AST: 81 IU/L — ABNORMAL HIGH (ref 0–40)
Albumin: 4.2 g/dL (ref 3.8–4.8)
Alkaline Phosphatase: 131 IU/L — ABNORMAL HIGH (ref 44–121)
BUN/Creatinine Ratio: 17 (ref 12–28)
BUN: 11 mg/dL (ref 8–27)
Bilirubin Total: 0.4 mg/dL (ref 0.0–1.2)
CO2: 22 mmol/L (ref 20–29)
Calcium: 9.3 mg/dL (ref 8.7–10.3)
Chloride: 102 mmol/L (ref 96–106)
Creatinine, Ser: 0.66 mg/dL (ref 0.57–1.00)
Globulin, Total: 2.1 g/dL (ref 1.5–4.5)
Glucose: 108 mg/dL — ABNORMAL HIGH (ref 70–99)
Potassium: 4.5 mmol/L (ref 3.5–5.2)
Sodium: 140 mmol/L (ref 134–144)
Total Protein: 6.3 g/dL (ref 6.0–8.5)
eGFR: 94 mL/min/{1.73_m2} (ref >59)

## 2023-12-05 LAB — LIPID PANEL
Chol/HDL Ratio: 4.3 ratio (ref 0.0–4.4)
Cholesterol, Total: 233 mg/dL — ABNORMAL HIGH (ref 100–199)
HDL: 54 mg/dL (ref >39)
LDL Chol Calc (NIH): 153 mg/dL — ABNORMAL HIGH (ref 0–99)
Triglycerides: 147 mg/dL (ref 0–149)
VLDL Cholesterol Cal: 26 mg/dL (ref 5–40)

## 2023-12-05 MED ORDER — ATORVASTATIN CALCIUM 20 MG PO TABS: 20.0000 mg | ORAL_TABLET | Freq: Every day | ORAL | 3 refills | Status: AC

## 2023-12-18 DIAGNOSIS — H2511 Age-related nuclear cataract, right eye: Secondary | ICD-10-CM | POA: Diagnosis not present

## 2023-12-19 DIAGNOSIS — H2512 Age-related nuclear cataract, left eye: Secondary | ICD-10-CM | POA: Diagnosis not present

## 2023-12-19 DIAGNOSIS — H25012 Cortical age-related cataract, left eye: Secondary | ICD-10-CM | POA: Diagnosis not present

## 2023-12-19 DIAGNOSIS — H25042 Posterior subcapsular polar age-related cataract, left eye: Secondary | ICD-10-CM | POA: Diagnosis not present

## 2024-01-08 DIAGNOSIS — H2512 Age-related nuclear cataract, left eye: Secondary | ICD-10-CM | POA: Diagnosis not present

## 2024-01-13 ENCOUNTER — Telehealth: Payer: Self-pay

## 2024-01-13 DIAGNOSIS — R911 Solitary pulmonary nodule: Secondary | ICD-10-CM

## 2024-01-13 NOTE — Telephone Encounter (Signed)
 Spoke with pt regarding the CT to look for a lung nodule that was ordered 03/19/23 of last year but was never scheduled and completed. Pt stated she had some family matters that came up and the CT was not a priority. Pt agreed to have a new order put in and is aware that she will be called to schedule. Pt verbalized understanding. All questions if any were answered.

## 2024-01-13 NOTE — Telephone Encounter (Signed)
-----   Message from Lynwood Schilling sent at 01/12/2024  6:14 PM EDT ----- Hi,  This patient was supposed to get a follow up CT to look for a lung nodule but she never did.  Can you let her know and document that we talked to her about it.  Thanks.

## 2024-03-29 ENCOUNTER — Other Ambulatory Visit: Payer: Self-pay | Admitting: Family

## 2024-05-03 ENCOUNTER — Telehealth: Payer: Self-pay

## 2024-05-03 NOTE — Telephone Encounter (Signed)
-----   Message from Lynwood Schilling sent at 05/02/2024  5:30 PM EDT ----- It looks like she is overdue for a follow up CT that we ordered.  Please see if she plans to come in for this and document in the chart that we have tried to schedule this.  Thanks.

## 2024-05-03 NOTE — Telephone Encounter (Signed)
 Spoke with pt regarding a chest CT that is overdue. Pt stated she is having some issues with her foot that makes it difficult to walk. Pt has an appointment with a doctor this week and stated she will call us  when her foot it better to get scheduled for the CT.

## 2024-05-11 NOTE — Progress Notes (Unsigned)
 [facility] [address] [place] [address] Phone: [phone] Subjective:    I'm seeing this patient by the request  of:  Lavell Bari LABOR, FNP  CC: right foot pain  Angela Curry  Angela Curry is a 73 y.o. female coming in with complaint of R foot pain. Last seen for back pain. Patient states for about 6 7 weeks. When she rest pain is relieved. Does endorse antalgic gait. Decreased ROM due to pain. Pain is from the arch to the heel. Would like a doctors note for footwear at work. No numbness or tingling. Meloxicam  has maybe helped.       Past Medical History:  Diagnosis Date   Asthma    light asthma after pneumonia about 15 years ago   Dyslipidemia    Dysphagia    Fibrocystic breast disease    GERD (gastroesophageal reflux disease)    H/O cold sores    Hiatal hernia    Osteopenia    Psoriasis    Vitamin D  deficiency    Past Surgical History:  Procedure Laterality Date   COLONOSCOPY     TONSILLECTOMY     TUBAL LIGATION     UPPER GASTROINTESTINAL ENDOSCOPY     Social History   Socioeconomic History   Marital status: Divorced    Spouse name: Not on file   Number of children: Not on file   Years of education: Not on file   Highest education level: Associate degree: academic program  Occupational History   Not on file  Tobacco Use   Smoking status: Never   Smokeless tobacco: Never  Vaping Use   Vaping status: Never Used  Substance and Sexual Activity   Alcohol use: Yes    Comment: few days a week   Drug use: No   Sexual activity: Not on file  Other Topics Concern   Not on file  Social History Narrative   Lives alone.  She is divorced and has 2 children and 5 grands.     Social Drivers of Corporate Investment Banker Strain: Low Risk  (05/08/2023)   Overall Financial Resource Strain (CARDIA)    Difficulty of Paying Living Expenses: Not hard at all  Food Insecurity: No Food Insecurity (05/08/2023)   Hunger  Vital Sign    Worried About Running Out of Food in the Last Year: Never true    Ran Out of Food in the Last Year: Never true  Transportation Needs: No Transportation Needs (05/08/2023)   PRAPARE - Administrator, Civil Service (Medical): No    Lack of Transportation (Non-Medical): No  Physical Activity: Insufficiently Active (05/08/2023)   Exercise Vital Sign    Days of Exercise per Week: 2 days    Minutes of Exercise per Session: 10 min  Stress: No Stress Concern Present (05/08/2023)   [employer] of Occupational Health - Occupational Stress Questionnaire    Feeling of Stress : Only a little  Social Connections: Unknown (05/08/2023)   Social Connection and Isolation Panel    Frequency of Communication with Friends and Family: More than three times a week    Frequency of Social Gatherings with Friends and Family: Patient declined    Attends Religious Services: Patient declined    Database Administrator or Organizations: No    Attends Engineer, Structural: Not on file    Marital Status: Not on file   Allergies  Allergen Reactions   Amoxicillin  Other (See  Comments)   Family History  Problem Relation Age of Onset   Diabetes Mother    Heart disease Father 42       Heart attack   Drug abuse Father    Early death Father    Cancer Paternal Aunt        breast   Breast cancer Paternal Aunt    Cancer Paternal Aunt        breast   Breast cancer Paternal Aunt    Esophageal cancer Neg Hx    Colon cancer Neg Hx    Stomach cancer Neg Hx    Rectal cancer Neg Hx      Current Outpatient Medications (Cardiovascular):    atorvastatin  (LIPITOR) 20 MG tablet, Take 1 tablet (20 mg total) by mouth daily.  Current Outpatient Medications (Respiratory):    fluticasone  (FLONASE ) 50 MCG/ACT nasal spray, Place 2 sprays into both nostrils daily.   Fluticasone -Salmeterol (ADVAIR DISKUS) 100-50 MCG/DOSE AEPB, Inhale 1 puff into the lungs in the morning and at bedtime.  (Patient taking differently: Inhale 1 puff into the lungs 2 (two) times daily as needed.)  Current Outpatient Medications (Analgesics):    meloxicam  (MOBIC ) 15 MG tablet, Take 1 tablet (15 mg total) by mouth daily.   Current Outpatient Medications (Other):    baclofen  (LIORESAL ) 10 MG tablet, Take 1 tablet (10 mg total) by mouth 3 (three) times daily.   Cholecalciferol 50 MCG (2000 UT) TABS, 1 tablet Orally Once a day   clobetasol  ointment (TEMOVATE ) 0.05 %, Apply topically as needed.   Dexlansoprazole  30 MG capsule DR, Take 1 capsule (30 mg total) by mouth daily.   DULoxetine  (CYMBALTA ) 30 MG capsule, Take 1 capsule (30 mg total) by mouth daily.   linaclotide  (LINZESS ) 145 MCG CAPS capsule, Take 1 capsule (145 mcg total) by mouth daily before breakfast. TAKE 1 CAPSULE(145 MCG) BY MOUTH DAILY BEFORE BREAKFAST   metroNIDAZOLE  (METROCREAM ) 0.75 % cream, Apply topically daily.   Vitamin D , Ergocalciferol , (DRISDOL ) 1.25 MG (50000 UNIT) CAPS capsule, TAKE 1 CAPSULE BY MOUTH EVERY 7 DAYS   Reviewed prior external information including notes and imaging from  primary care provider As well as notes that were available from care everywhere and other healthcare systems.  Past medical history, social, surgical and family history all reviewed in electronic medical record.  No pertanent information unless stated regarding to the chief complaint.   Review of Systems:  No headache, visual changes, nausea, vomiting, diarrhea, constipation, dizziness, abdominal pain, skin rash, fevers, chills, night sweats, weight loss, swollen lymph nodes, body aches, joint swelling, chest pain, shortness of breath, mood changes. POSITIVE muscle aches  Objective  Blood pressure 120/84, pulse 74, height 5' 2 (1.575 m), weight 222 lb (100.7 kg), SpO2 99%.   General: No apparent distress alert and oriented x3 mood and affect normal, dressed appropriately.  HEENT: Pupils equal, extraocular movements intact  Respiratory:  Patient's speak in full sentences and does not appear short of breath  Cardiovascular: No lower extremity edema, non tender, no erythema  Foot exam shows significant overpronation noted of the hindfoot on the right side.  Tender to palpation more over the ankle and the posterior aspect of the medial malleolus than truly over the navicular bone.  Negative Tinel's at the tarsal tunnel.  Neurovascularly intact distally.  Limited muscular skeletal ultrasound was performed and interpreted by CLAUDENE HUSSAR, M  Significant hypoechoic changes noted within the posterior tibialis tendon sheath.  Severe increase in Doppler flow noted in the  show acute on chronic.  No true tearing appreciated but difficult secondary to all the hypoechoic changes surrounding this entity. 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  This included exercises focusing on stretching, strengthening, with significant focus on eccentric aspects.   Long term goals include an improvement in range of motion, strength, endurance as well as avoiding reinjury. Patient's frequency would include in 1-2 times a day, 3-5 times a week for a duration of 6-12 weeks. Ankle strengthening that included:  Basic range of motion exercises to allow proper full motion at ankle Stretching of the lower leg and hamstrings  Theraband exercises for the lower leg - inversion, eversion, dorsiflexion and plantarflexion each to be completed with a theraband Balance exercises to increase proprioception Weight bearing exercises to increase strength and balance   Proper technique shown and discussed handout in great detail with ATC.  All questions were discussed and answered.     Impression and Recommendations:    The above documentation has been reviewed and is accurate and complete Almadelia Looman M Tyan Dy, DO

## 2024-05-12 ENCOUNTER — Ambulatory Visit: Admitting: Family Medicine

## 2024-05-12 ENCOUNTER — Encounter: Payer: Self-pay | Admitting: Family Medicine

## 2024-05-12 ENCOUNTER — Other Ambulatory Visit: Payer: Self-pay

## 2024-05-12 VITALS — BP 120/84 | HR 74 | Ht 62.0 in | Wt 222.0 lb

## 2024-05-12 DIAGNOSIS — M76821 Posterior tibial tendinitis, right leg: Secondary | ICD-10-CM

## 2024-05-12 DIAGNOSIS — M79671 Pain in right foot: Secondary | ICD-10-CM | POA: Diagnosis not present

## 2024-05-12 NOTE — Assessment & Plan Note (Signed)
 Significant overpronation noted, discussed with patient's history of the navicular bone we do need to be a little bit more cautious.  Discussed over-the-counter orthotics, recovery sandals, home exercises given.  I do not see any true cortical irregularity other than some underlying arthritis of the midfoot but that is not consistent with patient's symptoms discussed icing regimen and topical anti-inflammatories.  Held on any prescription medications at this point though.  Worsening pain consider injection and formal physical therapy.  Follow-up again in 6 to 8 weeks otherwise.

## 2024-05-12 NOTE — Patient Instructions (Addendum)
 Post tib HEP    Heel lift   [place]inco total support shoes   [place]oka recovery in the house   Ice and Voltaren  gel over areas of pain   Work note provided   2 month follow up

## 2024-06-01 ENCOUNTER — Ambulatory Visit: Admitting: Family Medicine

## 2024-06-07 ENCOUNTER — Ambulatory Visit: Payer: Self-pay | Admitting: Family

## 2024-06-07 ENCOUNTER — Ambulatory Visit (INDEPENDENT_AMBULATORY_CARE_PROVIDER_SITE_OTHER)

## 2024-06-07 ENCOUNTER — Other Ambulatory Visit: Payer: Self-pay | Admitting: Family

## 2024-06-07 ENCOUNTER — Encounter: Payer: Self-pay | Admitting: Family

## 2024-06-07 ENCOUNTER — Ambulatory Visit

## 2024-06-07 VITALS — BP 162/87 | HR 92 | Temp 97.4°F | Ht 62.0 in | Wt 228.2 lb

## 2024-06-07 DIAGNOSIS — M797 Fibromyalgia: Secondary | ICD-10-CM

## 2024-06-07 DIAGNOSIS — K5909 Other constipation: Secondary | ICD-10-CM

## 2024-06-07 DIAGNOSIS — Z780 Asymptomatic menopausal state: Secondary | ICD-10-CM

## 2024-06-07 DIAGNOSIS — Z13820 Encounter for screening for osteoporosis: Secondary | ICD-10-CM

## 2024-06-07 DIAGNOSIS — E0849 Diabetes mellitus due to underlying condition with other diabetic neurological complication: Secondary | ICD-10-CM

## 2024-06-07 DIAGNOSIS — Z23 Encounter for immunization: Secondary | ICD-10-CM

## 2024-06-07 DIAGNOSIS — K219 Gastro-esophageal reflux disease without esophagitis: Secondary | ICD-10-CM

## 2024-06-07 DIAGNOSIS — E785 Hyperlipidemia, unspecified: Secondary | ICD-10-CM

## 2024-06-07 DIAGNOSIS — M76821 Posterior tibial tendinitis, right leg: Secondary | ICD-10-CM

## 2024-06-07 DIAGNOSIS — J4520 Mild intermittent asthma, uncomplicated: Secondary | ICD-10-CM | POA: Diagnosis not present

## 2024-06-07 DIAGNOSIS — M79671 Pain in right foot: Secondary | ICD-10-CM

## 2024-06-07 MED ORDER — BACLOFEN 10 MG PO TABS: 10.0000 mg | ORAL_TABLET | Freq: Three times a day (TID) | ORAL | 2 refills | Status: AC

## 2024-06-07 MED ORDER — MELOXICAM 15 MG PO TABS
15.0000 mg | ORAL_TABLET | Freq: Every day | ORAL | 3 refills | Status: AC
Start: 2024-06-07 — End: ?

## 2024-06-07 MED ORDER — PREDNISONE 10 MG (21) PO TBPK: ORAL_TABLET | ORAL | 0 refills | Status: DC

## 2024-06-07 NOTE — Progress Notes (Signed)
 Subjective:    Patient ID: Angela Curry, female    DOB: Sep 28, 1950, 73 y.o.   MRN: [id number]  Chief Complaint  Patient presents with   Medical Management of Chronic Issues    Pt presents to the office today for chronic follow up.   Has morbid obese with a BMI of 41 and GERD and constipation.    She is followed by dermatologists every 6 months for psoriasis.Had negative RA panel.     She is followed by Ortho as needed for right shoulder injection and back pain. Complaining of right shoulder paint that radiates down her right arm.   Pt complaining of left heel pain for the last 3 months. Saw ortho and diagnosed with tibialis posterior tendinitis.  Asthma She complains of frequent throat clearing, hoarse voice, shortness of breath and wheezing. There is no difficulty breathing. This is a chronic problem. The current episode started more than 1 year ago. The problem occurs intermittently. Associated symptoms include heartburn. Her symptoms are alleviated by rest. Her past medical history is significant for asthma.  Gastroesophageal Reflux She complains of belching, heartburn, a hoarse voice and wheezing. This is a chronic problem. The current episode started more than 1 year ago. The problem occurs rarely. The symptoms are aggravated by certain foods. Risk factors include obesity. She has tried a PPI for the symptoms. The treatment provided moderate relief.  Constipation This is a chronic problem. The current episode started more than 1 year ago. The problem has been resolved since onset. Her stool frequency is 1 time per day. Associated symptoms include back pain. Risk factors include obesity. She has tried laxatives (linzess ) for the symptoms. The treatment provided moderate relief.  Back Pain This is a chronic problem. The current episode started more than 1 year ago. The problem occurs intermittently. The problem has been waxing and waning since onset. The pain is present in the  lumbar spine. The pain is at a severity of 1/10. The pain is mild.  Hyperlipidemia This is a chronic problem. The current episode started more than 1 year ago. The problem is uncontrolled. Recent lipid tests were reviewed and are high. Exacerbating diseases include obesity. Associated symptoms include shortness of breath. Current antihyperlipidemic treatment includes diet change. The current treatment provides no improvement of lipids. Risk factors for coronary artery disease include dyslipidemia, hypertension, a sedentary lifestyle, post-menopausal and obesity.      Review of Systems  HENT:  Positive for hoarse voice.   Respiratory:  Positive for shortness of breath and wheezing.   Gastrointestinal:  Positive for constipation and heartburn.  Musculoskeletal:  Positive for back pain.  All other systems reviewed and are negative.      Objective:   Physical Exam Vitals reviewed.  Constitutional:      General: She is not in acute distress.    Appearance: She is well-developed. She is obese.  HENT:     Head: Normocephalic and atraumatic.     Right Ear: Tympanic membrane normal.     Left Ear: Tympanic membrane normal.  Eyes:     Pupils: Pupils are equal, round, and reactive to light.  Neck:     Thyroid : No thyromegaly.  Cardiovascular:     Rate and Rhythm: Normal rate and regular rhythm.     Heart sounds: Normal heart sounds. No murmur heard. Pulmonary:     Effort: Pulmonary effort is normal. No respiratory distress.     Breath sounds: Normal breath sounds. No wheezing.  Abdominal:     General: Bowel sounds are normal. There is no distension.     Palpations: Abdomen is soft.     Tenderness: There is no abdominal tenderness.  Musculoskeletal:        General: Tenderness present.     Cervical back: Normal range of motion and neck supple.     Right lower leg: No edema.     Left lower leg: No edema.     Comments: Full ROM of back , pain in right heel. Pain when standing on her  tiptoes.   Skin:    General: Skin is warm and dry.  Neurological:     Mental Status: She is alert and oriented to person, place, and time.     Cranial Nerves: No cranial nerve deficit.     Deep Tendon Reflexes: Reflexes are normal and symmetric.     Comments: Mild right weakness of right arm and hand  Psychiatric:        Behavior: Behavior normal.        Thought Content: Thought content normal.        Judgment: Judgment normal.       BP (!) 162/87   Pulse 92   Temp (!) 97.4 F (36.3 C) (Temporal)   Ht 5' 2 (1.575 m)   Wt 228 lb 3.2 oz (103.5 kg)   BMI 41.74 kg/m      Assessment & Plan:  Angela Curry comes in today with chief complaint of Medical Management of Chronic Issues   Diagnosis and orders addressed:  1. Fibromyalgia muscle pain - baclofen  (LIORESAL ) 10 MG tablet; Take 1 tablet (10 mg total) by mouth 3 (three) times daily.  Dispense: 60 each; Refill: 2 - meloxicam  (MOBIC ) 15 MG tablet; Take 1 tablet (15 mg total) by mouth daily.  Dispense: 90 tablet; Refill: 3 - CMP14+EGFR  2. Encounter for immunization (Primary) - Flu vaccine HIGH DOSE PF(Fluzone Trivalent)  3. Diabetes due to undrl condition w oth diabetic neuro comp (HCC) - CMP14+EGFR  4. Morbid obesity (HCC)  - CMP14+EGFR  5. Mild intermittent asthma without complication - CMP14+EGFR  6. Chronic constipation - CMP14+EGFR  7. Gastroesophageal reflux disease, unspecified whether esophagitis present - CMP14+EGFR  8. Hyperlipidemia, unspecified hyperlipidemia type - CMP14+EGFR  9. Tibialis posterior tendinitis, right - DG Foot Complete Right; Future - CMP14+EGFR  10. Pain of right heel - DG Foot Complete Right; Future - meloxicam  (MOBIC ) 15 MG tablet; Take 1 tablet (15 mg total) by mouth daily.  Dispense: 90 tablet; Refill: 3 - predniSONE  (STERAPRED UNI-PAK 21 TAB) 10 MG (21) TBPK tablet; Use as directed  Dispense: 21 tablet; Refill: 0 - CMP14+EGFR   Labs pending Start Mobic  and  prednisone   Continue Cymbalta  to 20 mg for a few days then stop Continue current medications  Health Maintenance reviewed Diet and exercise encouraged  Follow up plan: 6 months    Bari Learn, FNP

## 2024-06-07 NOTE — Patient Instructions (Signed)
 Foot Pain Many things can cause foot pain. Common causes include injuries to the foot. The injuries include sprains or broken bones, or injuries that affect the nerves in the feet. Other causes of foot pain include arthritis, blisters, and bunions. To know what causes your foot pain, your health care provider will take a detailed history of your symptoms. They will also do a physical exam as well as imaging tests, such as X-ray or MRI. Follow these instructions at home: Managing pain, stiffness, and swelling  If told, put ice on the painful area. Put ice in a plastic bag. Place a towel between your skin and the bag. Leave the ice on for 20 minutes, 2-3 times a day. If your skin turns bright red, remove the ice right away to prevent skin damage. The risk of damage is higher if you cannot feel pain, heat, or cold. Activity Do not stand or walk for long periods. Do stretches to relieve foot pain and stiffness as told by your provider. Do not lift anything that is heavier than 10 lb (4.5 kg), or the limit that you are told, until your provider says that it is safe. Lifting a lot of weight can put added pressure on your feet. Return to your normal activities as told by your provider. Ask your provider what activities are safe for you. Lifestyle Wear comfortable, supportive shoes that fit you well. Do not wear high heels. Keep your feet clean and dry. General instructions Take over-the-counter and prescription medicines only as told by your provider. Rub your foot gently. Pay attention to any changes in your symptoms. Let your provider know if symptoms become worse. Keep all follow-up visits. Your provider will want to monitor your progress. Contact a health care provider if: Your pain does not get better after a few days of treatment at home. Your pain gets worse. You cannot stand on your foot. Your foot or toes are swollen. Your foot is numb or tingling. Get help right away if: Your foot  or toes turn white or blue. You have warmth and redness along your foot. This information is not intended to replace advice given to you by your health care provider. Make sure you discuss any questions you have with your health care provider. Document Revised: 07/25/2022 Document Reviewed: 04/02/2022 Elsevier Patient Education  2024 [employer].

## 2024-06-08 ENCOUNTER — Ambulatory Visit: Payer: Self-pay | Admitting: Family

## 2024-06-08 LAB — CMP14+EGFR
ALT: 68 IU/L — ABNORMAL HIGH (ref 0–32)
AST: 86 IU/L — ABNORMAL HIGH (ref 0–40)
Albumin: 4.3 g/dL (ref 3.8–4.8)
Alkaline Phosphatase: 143 IU/L — ABNORMAL HIGH (ref 49–135)
BUN/Creatinine Ratio: 19 (ref 12–28)
BUN: 12 mg/dL (ref 8–27)
Bilirubin Total: 0.5 mg/dL (ref 0.0–1.2)
CO2: 23 mmol/L (ref 20–29)
Calcium: 9.8 mg/dL (ref 8.7–10.3)
Chloride: 102 mmol/L (ref 96–106)
Creatinine, Ser: 0.64 mg/dL (ref 0.57–1.00)
Globulin, Total: 2.8 g/dL (ref 1.5–4.5)
Glucose: 94 mg/dL (ref 70–99)
Potassium: 4.4 mmol/L (ref 3.5–5.2)
Sodium: 141 mmol/L (ref 134–144)
Total Protein: 7.1 g/dL (ref 6.0–8.5)
eGFR: 94 mL/min/1.73 (ref >59)

## 2024-06-08 LAB — MICROALBUMIN / CREATININE URINE RATIO
Creatinine, Urine: 326.7 mg/dL
Microalb/Creat Ratio: 18 mg/g{creat} (ref 0–29)
Microalbumin, Urine: 58.1 ug/mL

## 2024-06-09 DIAGNOSIS — M810 Age-related osteoporosis without current pathological fracture: Secondary | ICD-10-CM | POA: Diagnosis not present

## 2024-06-09 DIAGNOSIS — Z780 Asymptomatic menopausal state: Secondary | ICD-10-CM | POA: Diagnosis not present

## 2024-06-14 ENCOUNTER — Ambulatory Visit: Payer: Self-pay | Admitting: Family

## 2024-06-14 MED ORDER — ALENDRONATE SODIUM 70 MG PO TABS: 70.0000 mg | ORAL_TABLET | ORAL | 2 refills | Status: AC

## 2024-06-15 ENCOUNTER — Other Ambulatory Visit: Payer: Self-pay | Admitting: Family

## 2024-06-15 DIAGNOSIS — M810 Age-related osteoporosis without current pathological fracture: Secondary | ICD-10-CM

## 2024-06-17 ENCOUNTER — Ambulatory Visit (HOSPITAL_COMMUNITY): Admission: RE | Admit: 2024-06-17 | Discharge: 2024-06-17 | Attending: Cardiology | Admitting: Cardiology

## 2024-06-17 DIAGNOSIS — R911 Solitary pulmonary nodule: Secondary | ICD-10-CM | POA: Diagnosis not present

## 2024-06-17 DIAGNOSIS — R918 Other nonspecific abnormal finding of lung field: Secondary | ICD-10-CM | POA: Diagnosis not present

## 2024-06-17 DIAGNOSIS — R590 Localized enlarged lymph nodes: Secondary | ICD-10-CM | POA: Diagnosis not present

## 2024-06-18 ENCOUNTER — Ambulatory Visit (HOSPITAL_COMMUNITY)

## 2024-06-27 ENCOUNTER — Ambulatory Visit: Payer: Self-pay | Admitting: Cardiology

## 2024-06-27 DIAGNOSIS — R911 Solitary pulmonary nodule: Secondary | ICD-10-CM

## 2024-06-30 ENCOUNTER — Other Ambulatory Visit: Payer: Self-pay

## 2024-06-30 ENCOUNTER — Ambulatory Visit: Admitting: Family Medicine

## 2024-06-30 VITALS — BP 126/90 | HR 90 | Ht 62.0 in | Wt 232.0 lb

## 2024-06-30 DIAGNOSIS — M5416 Radiculopathy, lumbar region: Secondary | ICD-10-CM | POA: Diagnosis not present

## 2024-06-30 DIAGNOSIS — M76821 Posterior tibial tendinitis, right leg: Secondary | ICD-10-CM

## 2024-06-30 DIAGNOSIS — M79671 Pain in right foot: Secondary | ICD-10-CM | POA: Diagnosis not present

## 2024-06-30 MED ORDER — METHYLPREDNISOLONE ACETATE 80 MG/ML IJ SUSP
80.0000 mg | Freq: Once | INTRAMUSCULAR | Status: AC
  Administered 2024-06-30: 11:00:00 80 mg via INTRAMUSCULAR

## 2024-06-30 MED ORDER — KETOROLAC TROMETHAMINE 60 MG/2ML IM SOLN
60.0000 mg | Freq: Once | INTRAMUSCULAR | Status: AC
  Administered 2024-06-30: 11:00:00 60 mg via INTRAMUSCULAR

## 2024-06-30 NOTE — Assessment & Plan Note (Signed)
 Worsening low back pain.  Toradol  and Depo-Medrol  injections given today.  Discussed potential imaging.  Discussed home exercises, increase activity slowly.  Follow-up again in 6 to 8 weeks

## 2024-06-30 NOTE — Progress Notes (Signed)
 [facility] [address] [place] [address] Phone: [phone] Subjective:   Angela Curry, am serving as a scribe for Dr. Arthea Curry.  I'm seeing this patient by the request  of:  Angela Curry LABOR, FNP  CC: Right foot pain  YEP:Dlagzrupcz  05/12/2024 Significant overpronation noted, discussed with patient's history of the navicular bone we do need to be a little bit more cautious.  Discussed over-the-counter orthotics, recovery sandals, home exercises given.  I do not see any true cortical irregularity other than some underlying arthritis of the midfoot but that is not consistent with patient's symptoms discussed icing regimen and topical anti-inflammatories.  Held on any prescription medications at this point though.  Worsening pain consider injection and formal physical therapy.  Follow-up again in 6 to 8 weeks otherwise.      Update 06/30/2024 Angela Curry is a 73 y.o. female coming in with complaint of R foot pain. Patient states L side sciatic nerve is irritated as well. Feet feel like they are better, but still pain in the heel. Today not the best day. Can hurt at rest. Depends on the position.      Past Medical History:  Diagnosis Date   Asthma    light asthma after pneumonia about 15 years ago   Dyslipidemia    Dysphagia    Fibrocystic breast disease    GERD (gastroesophageal reflux disease)    H/O cold sores    Hiatal hernia    Osteopenia    Psoriasis    Vitamin D  deficiency    Past Surgical History:  Procedure Laterality Date   COLONOSCOPY     TONSILLECTOMY     TUBAL LIGATION     UPPER GASTROINTESTINAL ENDOSCOPY     Social History   Socioeconomic History   Marital status: Divorced    Spouse name: Not on file   Number of children: Not on file   Years of education: Not on file   Highest education level: Some college, no degree  Occupational History   Not on file  Tobacco Use   Smoking status: Never    Smokeless tobacco: Never  Vaping Use   Vaping status: Never Used  Substance and Sexual Activity   Alcohol use: Yes    Comment: few days a week   Drug use: No   Sexual activity: Not on file  Other Topics Concern   Not on file  Social History Narrative   Lives alone.  She is divorced and has 2 children and 5 grands.     Social Drivers of Health   Tobacco Use: Low Risk (06/07/2024)   Patient History    Smoking Tobacco Use: Never    Smokeless Tobacco Use: Never    Passive Exposure: Not on file  Financial Resource Strain: Patient Declined (06/03/2024)   Overall Financial Resource Strain (CARDIA)    Difficulty of Paying Living Expenses: Patient declined  Food Insecurity: Patient Declined (06/03/2024)   Epic    Worried About Programme Researcher, Broadcasting/film/video in the Last Year: Patient declined    Barista in the Last Year: Patient declined  Transportation Needs: Patient Declined (06/03/2024)   Epic    Lack of Transportation (Medical): Patient declined    Lack of Transportation (Non-Medical): Patient declined  Physical Activity: Unknown (06/03/2024)   Exercise Vital Sign    Days of Exercise per Week: Patient declined    Minutes of Exercise per Session: Not on file  Stress: Patient Declined (06/03/2024)   [employer] of Occupational Health - Occupational Stress Questionnaire    Feeling of Stress: Patient declined  Social Connections: Unknown (06/03/2024)   Social Connection and Isolation Panel    Frequency of Communication with Friends and Family: More than three times a week    Frequency of Social Gatherings with Friends and Family: More than three times a week    Attends Religious Services: Patient declined    Active Member of Clubs or Organizations: Patient declined    Attends Banker Meetings: Not on file    Marital Status: Divorced  Depression (PHQ2-9): Low Risk (12/04/2023)   Depression (PHQ2-9)    PHQ-2 Score: 0  Alcohol Screen: Low Risk (05/08/2023)    Alcohol Screen    Last Alcohol Screening Score (AUDIT): 3  Housing: Low Risk (05/08/2023)   Housing    Last Housing Risk Score: 0  Utilities: Not on file  Health Literacy: Not on file   Allergies[1] Family History  Problem Relation Age of Onset   Diabetes Mother    Heart disease Father 3       Heart attack   Drug abuse Father    Early death Father    Cancer Paternal Aunt        breast   Breast cancer Paternal Aunt    Cancer Paternal Aunt        breast   Breast cancer Paternal Aunt    Esophageal cancer Neg Hx    Colon cancer Neg Hx    Stomach cancer Neg Hx    Rectal cancer Neg Hx     Current Outpatient Medications (Endocrine & Metabolic):    alendronate  (FOSAMAX ) 70 MG tablet, Take 1 tablet (70 mg total) by mouth every 7 (seven) days. Take with a full glass of water on an empty stomach.   predniSONE  (STERAPRED UNI-PAK 21 TAB) 10 MG (21) TBPK tablet, Use as directed  Current Outpatient Medications (Cardiovascular):    atorvastatin  (LIPITOR) 20 MG tablet, Take 1 tablet (20 mg total) by mouth daily.  Current Outpatient Medications (Respiratory):    fluticasone  (FLONASE ) 50 MCG/ACT nasal spray, Place 2 sprays into both nostrils daily.   Fluticasone -Salmeterol (ADVAIR DISKUS) 100-50 MCG/DOSE AEPB, Inhale 1 puff into the lungs in the morning and at bedtime. (Patient taking differently: Inhale 1 puff into the lungs 2 (two) times daily as needed.)  Current Outpatient Medications (Analgesics):    meloxicam  (MOBIC ) 15 MG tablet, Take 1 tablet (15 mg total) by mouth daily.  Current Outpatient Medications (Other):    baclofen  (LIORESAL ) 10 MG tablet, Take 1 tablet (10 mg total) by mouth 3 (three) times daily.   Cholecalciferol 50 MCG (2000 UT) TABS, 1 tablet Orally Once a day   clobetasol  ointment (TEMOVATE ) 0.05 %, Apply topically as needed.   Dexlansoprazole  30 MG capsule DR, Take 1 capsule (30 mg total) by mouth daily.   DULoxetine  (CYMBALTA ) 30 MG capsule, Take 1 capsule (30 mg  total) by mouth daily.   linaclotide  (LINZESS ) 145 MCG CAPS capsule, Take 1 capsule (145 mcg total) by mouth daily before breakfast. TAKE 1 CAPSULE(145 MCG) BY MOUTH DAILY BEFORE BREAKFAST   metroNIDAZOLE  (METROCREAM ) 0.75 % cream, Apply topically daily.   Vitamin D , Ergocalciferol , (DRISDOL ) 1.25 MG (50000 UNIT) CAPS capsule, TAKE 1 CAPSULE BY MOUTH EVERY 7 DAYS   Reviewed prior external information including notes and imaging from  primary care provider As well as notes that were available from care everywhere and other  healthcare systems.  Past medical history, social, surgical and family history all reviewed in electronic medical record.  No pertanent information unless stated regarding to the chief complaint.   Review of Systems:  No headache, visual changes, nausea, vomiting, diarrhea, constipation, dizziness, abdominal pain, skin rash, fevers, chills, night sweats, weight loss, swollen lymph nodes, body aches, joint swelling, chest pain, shortness of breath, mood changes. POSITIVE muscle aches  Objective  There were no vitals taken for this visit.   General: No apparent distress alert and oriented x3 mood and affect normal, dressed appropriately.  HEENT: Pupils equal, extraocular movements intact  Respiratory: Patient's speak in full sentences and does not appear short of breath  Cardiovascular: No lower extremity edema, non tender, no erythema  Right foot exam shows overpronation of hindfoot noted.  Tender to palpation over the posterior tibialis area.  Procedure: Real-time Ultrasound Guided Injection of right posterior tibialis tendon sheath Device: GE Logiq Q7 Ultrasound guided injection is preferred based studies that show increased duration, increased effect, greater accuracy, decreased procedural pain, increased response rate, and decreased cost with ultrasound guided versus blind injection.  Verbal informed consent obtained.  Time-out conducted.  Noted no overlying  erythema, induration, or other signs of local infection.  Skin prepped in a sterile fashion.  Local anesthesia: Topical Ethyl chloride.  With sterile technique and under real time ultrasound guidance: With a 25-gauge half inch needle injected with 0.5 cc of 0.5% Marcaine and 0.5 cc of Kenalog 40 mg/mL Completed without difficulty  Pain immediately resolved suggesting accurate placement of the medication.  Advised to call if fevers/chills, erythema, induration, drainage, or persistent bleeding.  Impression: Technically successful ultrasound guided injection.   Impression and Recommendations:    The above documentation has been reviewed and is accurate and complete Angela Callins M Arlene Brickel, DO        [1]  Allergies Allergen Reactions   Amoxicillin  Other (See Comments)

## 2024-06-30 NOTE — Assessment & Plan Note (Signed)
 Significant increase in hypoechoic changes from previous exam.  Injection given today and tolerated the procedure well.  Improvement in some range of motion almost immediately.  Discussed icing regimen.  Discussed home exercises.  Increase activity slowly.  Follow-up again in 6 to 8 weeks.

## 2024-06-30 NOTE — Patient Instructions (Addendum)
 See me again in 2 months Hold on meloxicam  for 2 days Injections in backside Back ex

## 2024-07-02 NOTE — Telephone Encounter (Signed)
 Pt returning nurse call to discuss results.

## 2024-07-12 ENCOUNTER — Ambulatory Visit: Admitting: Family Medicine

## 2024-07-12 ENCOUNTER — Telehealth: Payer: Self-pay

## 2024-07-12 NOTE — Progress Notes (Signed)
 Care Guide Pharmacy Note  07/12/2024 Name: [name] MRN: [id number] DOB: 12-15-1950  Referred By: Lavell Bari LABOR, FNP Reason for referral: Complex Care Management (Outreach to schedule with pharm d )   [name] is a 73 y.o. year old female who is a primary care patient of Lavell Bari LABOR, FNP.  [name] was referred to the pharmacist for assistance related to: osteoporosis   Successful contact was made with the patient to discuss pharmacy services including being ready for the pharmacist to call at least 5 minutes before the scheduled appointment time and to have medication bottles and any blood pressure readings ready for review. The patient agreed to meet with the pharmacist via telephone visit on (date/time).07/30/2024  Jeoffrey Buffalo , RMA     [place]  [facility], [facility] Guide  Direct Dial: [phone]  Website: [email or web address]

## 2024-07-13 ENCOUNTER — Encounter: Payer: Self-pay | Admitting: Nurse Practitioner

## 2024-07-13 ENCOUNTER — Ambulatory Visit: Admitting: Nurse Practitioner

## 2024-07-13 ENCOUNTER — Encounter: Payer: Self-pay | Admitting: Family Medicine

## 2024-07-13 VITALS — BP 140/86 | HR 77 | Temp 96.8°F | Ht 62.0 in | Wt 229.0 lb

## 2024-07-13 DIAGNOSIS — N611 Abscess of the breast and nipple: Secondary | ICD-10-CM

## 2024-07-13 MED ORDER — SULFAMETHOXAZOLE-TRIMETHOPRIM 800-160 MG PO TABS: 1.0000 | ORAL_TABLET | Freq: Two times a day (BID) | ORAL | 0 refills | Status: DC

## 2024-07-13 NOTE — Progress Notes (Signed)
 "  Subjective:    Patient ID: Angela Curry, female    DOB: 26-Aug-1950, 73 y.o.   MRN: [id number]   Chief Complaint: Sore on right breast (Starting draining yesterday. Nausea and smelling different in the last few weeks)   HPI  Patient comes in with a sore area on right breast. She mostly noticed an odor about her for about 2 weeks. Now she has a draining sore on right breast. Area has gotten some bigger. Patient had to go back for 2nd mammogram due to that area. Was told she had a black head right breast.  Patient Active Problem List   Diagnosis Date Noted   Diabetes due to undrl condition w oth diabetic neuro comp (HCC) 06/07/2024   Tibialis posterior tendinitis, right 05/12/2024   Fatty liver 05/27/2023   Lumbar radiculopathy, acute 04/01/2023   SOB (shortness of breath) 12/28/2022   Right shoulder pain 08/08/2022   Asthma 08/04/2020   Oral herpes 08/04/2020   Hormone replacement therapy (HRT) 07/16/2018   Chronic pain of left knee 07/16/2018   Folic acid  deficiency 12/04/2017   Rosacea 07/31/2017   Closed navicular fracture of right ankle 01/09/2016   Morbid obesity (HCC) 01/09/2016   Diverticulitis of colon without hemorrhage 06/01/2015   GERD (gastroesophageal reflux disease) 08/24/2014   Chronic constipation 08/24/2014   Palpitations 01/04/2013   Edema 01/04/2013   Hyperlipidemia 01/04/2013   Osteopenia    Fibrocystic breast disease    Hiatal hernia    Psoriasis 11/10/2012       Review of Systems  Constitutional:  Negative for diaphoresis.  Eyes:  Negative for pain.  Respiratory:  Negative for shortness of breath.   Cardiovascular:  Negative for chest pain, palpitations and leg swelling.  Gastrointestinal:  Negative for abdominal pain.  Endocrine: Negative for polydipsia.  Skin:  Negative for rash.  Neurological:  Negative for dizziness, weakness and headaches.  Hematological:  Does not bruise/bleed easily.  All other systems reviewed and are  negative.      Objective:   Physical Exam Constitutional:      Appearance: Normal appearance.  Cardiovascular:     Rate and Rhythm: Normal rate and regular rhythm.     Heart sounds: Normal heart sounds.  Pulmonary:     Effort: Pulmonary effort is normal.     Breath sounds: Normal breath sounds.  Chest:     Chest wall: No mass.  Breasts:    Right: Skin change present.     Left: Normal.       Comments: Erythematous area 3cm on right breast. Slight drainage noted- sore to  touch Skin:    General: Skin is warm.  Neurological:     General: No focal deficit present.     Mental Status: She is alert and oriented to person, place, and time.  Psychiatric:        Mood and Affect: Mood normal.        Behavior: Behavior normal.    BP (!) 140/86   Pulse 77   Temp (!) 96.8 F (36 C) (Temporal)   Ht 5' 2 (1.575 m)   Wt 229 lb (103.9 kg)   SpO2 97%   BMI 41.88 kg/m         Assessment & Plan:  Angela Curry in today with chief complaint of Sore on right breast (Starting draining yesterday. Nausea and smelling different in the last few weeks)   1. Abscess of right breast (Primary) Clean daily with antibacterial soap  Keep covered Ok to squeeze us  out if needed RTO if not improving - Anaerobic and Aerobic Culture - sulfamethoxazole-trimethoprim (BACTRIM DS) 800-160 MG tablet; Take 1 tablet by mouth 2 (two) times daily.  Dispense: 20 tablet; Refill: 0    The above assessment and management plan was discussed with the patient. The patient verbalized understanding of and has agreed to the management plan. Patient is aware to call the clinic if symptoms persist or worsen. Patient is aware when to return to the clinic for a follow-up visit. Patient educated on when it is appropriate to go to the emergency department.   Angela Gladis, FNP    "

## 2024-07-13 NOTE — Telephone Encounter (Signed)
Patient is returning phone call. Please advise.

## 2024-07-13 NOTE — Patient Instructions (Signed)
 Skin Abscess    A skin abscess is an infected spot of skin. It can have pus in it. An abscess can happen in any part of your body.  Some abscesses break open (rupture) on their own. Most keep getting worse unless they are treated. If your abscess is not treated, the infection can spread deeper into your body and blood. This can make you feel sick.  What are the causes?  Germs that enter your skin. This may happen if you have:  A cut or scrape.  A wound from a needle or an insect bite.  Blocked oil or sweat glands.  A problem with the spot where your hair goes into your skin.  A fluid-filled sac called a cyst under your skin.  What increases the risk?  Having problems with how your blood moves through your body.  Having a weak body defense system (immune system).  Having diabetes.  Having dry and irritated skin.  Needing to get shots often.  Putting drugs into your body with a needle.  Having a splinter or something else in your skin.  Smoking.  What are the signs or symptoms?  A firm bump under your skin that hurts.  A bump with pus at the top.  Redness and swelling.  Warm or tender spots.  A sore on the skin.  How is this treated?  You may need to:  Put a heat pack or a warm, wet washcloth on the spot.  Have the pus drained.  Take antibiotics.  Follow these instructions at home:  Medicines  Take over-the-counter and prescription medicines only as told by your doctor.  If you were prescribed antibiotics, take them as told by your doctor. Do not stop taking them even if you start to feel better.  Abscess care    If you have an abscess that has not drained, put heat on it. Use the heat source that your doctor recommends, such as a moist heat pack or a heating pad.  Place a towel between your skin and the heat source.  Leave the heat on for 20-30 minutes.  If your skin turns bright red, take off the heat right away to prevent burns. The risk of burns is higher if you cannot feel pain, heat, or cold.  Follow  instructions from your doctor about how to take care of your abscess. Make sure you:  Cover the abscess with a bandage.  Wash your hands with soap and water for at least 20 seconds before and after you change your bandage. If you cannot use soap and water, use hand sanitizer.  Change your bandage as told by your doctor.  Check your abscess every day for signs that the infection is getting worse. Check for:  More redness, swelling, or pain.  More fluid or blood.  Warmth.  More pus or a worse smell.  General instructions  To keep the infection from spreading:  Do not share personal items or towels.  Do not go in a hot tub with others.  Avoid making skin contact with others.  Be careful when you get rid of used bandages or any pus from the abscess.  Do not smoke or use any products that contain nicotine or tobacco. If you need help quitting, ask your doctor.  Contact a doctor if:  You see red streaks on your skin near the abscess.  You have any signs of worse infection.  You vomit every time you eat or drink.  You have  a fever, chills, or muscle aches.  The cyst or abscess comes back.  Get help right away if:  You have very bad pain.  You make less pee (urine) than normal.  This information is not intended to replace advice given to you by your health care provider. Make sure you discuss any questions you have with your health care provider.  Document Revised: 02/13/2022 Document Reviewed: 02/13/2022  Elsevier Patient Education  2024 [employer].

## 2024-07-14 ENCOUNTER — Other Ambulatory Visit

## 2024-07-14 ENCOUNTER — Other Ambulatory Visit: Payer: Self-pay

## 2024-07-14 DIAGNOSIS — M79671 Pain in right foot: Secondary | ICD-10-CM

## 2024-07-14 DIAGNOSIS — G8929 Other chronic pain: Secondary | ICD-10-CM

## 2024-07-14 NOTE — Telephone Encounter (Signed)
-----   Message from Lynwood Schilling, MD sent at 06/27/2024  4:59 PM EST ----- She has still some small nodules and 12 month follow up non contrasted CT is suggested.  Please let her know and schedule follow up.  She has a known hiatal hernia .  Coronary calcium  is known from  previous.  Thanks. Call Ms. Estelle with the results and send results to Lavell Bari LABOR, FNP

## 2024-07-14 NOTE — Telephone Encounter (Signed)
 Spoke with pt regarding her results. Pt agreeable to plan. CT ordered. Pt verbalized understanding. All questions if any were answered. Results sent to PCP

## 2024-07-16 ENCOUNTER — Ambulatory Visit: Payer: Self-pay

## 2024-07-16 ENCOUNTER — Encounter: Payer: Self-pay | Admitting: Family Medicine

## 2024-07-16 ENCOUNTER — Ambulatory Visit: Admitting: Family Medicine

## 2024-07-16 VITALS — BP 122/79 | HR 92 | Temp 97.0°F | Ht 62.0 in | Wt 230.4 lb

## 2024-07-16 DIAGNOSIS — N61 Mastitis without abscess: Secondary | ICD-10-CM | POA: Diagnosis not present

## 2024-07-16 DIAGNOSIS — N611 Abscess of the breast and nipple: Secondary | ICD-10-CM | POA: Diagnosis not present

## 2024-07-16 MED ORDER — CEPHALEXIN 500 MG PO CAPS: 500.0000 mg | ORAL_CAPSULE | Freq: Four times a day (QID) | ORAL | 0 refills | Status: AC

## 2024-07-16 MED ORDER — CEFTRIAXONE SODIUM 1 G IJ SOLR
1.0000 g | Freq: Once | INTRAMUSCULAR | Status: AC
  Administered 2024-07-16: 1 g via INTRAMUSCULAR

## 2024-07-16 NOTE — Telephone Encounter (Signed)
 Copied from CRM [id number]. Topic: Clinical - Red Word Triage >> Jul 16, 2024 11:31 AM [name] wrote: Red Word that prompted transfer to Nurse Triage: abscess breast previously seen worsening growing - whole breast sore. >> Jul 16, 2024 11:36 AM Zebedee SAUNDERS wrote: Pt stated she was disconnected and calling about right breast abscess on breast worsening growing - whole breast sore.

## 2024-07-16 NOTE — Telephone Encounter (Signed)
 Patient disconnected call prior to WT. Attempt # 1 to reach patient to triage symptoms. Left VM to call back    Copied from CRM #[id number]. Topic: Clinical - Red Word Triage >> Jul 16, 2024 11:31 AM [name] wrote: Red Word that prompted transfer to Nurse Triage: abscess breast previously seen worsening growing - whole breast sore.

## 2024-07-16 NOTE — Telephone Encounter (Signed)
 FYI Only or Action Required?: Action required by provider: update on patient condition.  Patient was last seen in primary care on 07/13/2024 by Angela Mustard, FNP.  Called Nurse Triage reporting [place].    Triage Disposition: See Physician Within 24 Hours  Patient/caregiver understands and will follow disposition?: No, wishes to speak with PCP              Copied from CRM [id number]. Topic: Clinical - Red Word Triage >> Jul 16, 2024 11:31 AM [name] wrote: Red Word that prompted transfer to Nurse Triage: abscess breast previously seen worsening growing - whole breast sore. >> Jul 16, 2024 11:36 AM Zebedee SAUNDERS wrote: Pt stated she was disconnected and calling about right breast abscess on breast worsening growing - whole breast sore.

## 2024-07-16 NOTE — Telephone Encounter (Signed)
 Patient was seen in office by L.Rakes, FNP

## 2024-07-16 NOTE — Progress Notes (Signed)
 "    Subjective:  Patient ID: Angela Curry, female    DOB: [birth year], 74 y.o.   MRN: [id number]  Patient Care Team: Lavell Bari LABOR, FNP as PCP - General (Nurse Practitioner) Lavona Agent, MD as PCP - Cardiology (Cardiology)   Chief Complaint:  abcess on right breast (Was seen on Tuesday but states that it is worse. )   HPI: Angela Curry is a 74 y.o. female presenting on 07/16/2024 for abcess on right breast (Was seen on Tuesday but states that it is worse. )    Angela Curry is a 74 year old female who presents with a breast abscess.  She developed a breast abscess initially noticed last Tuesday. Over the weekend, symptoms worsened, including the spread of the abscess. She sought medical attention on Monday but was seen on Tuesday morning when a sample was taken, and she was started on Bactrim. Despite this, the abscess has continued to expand, extending up her chest. The area is sore, with her breast hurting, and she has experienced chills over the past three days, although she has not recorded a fever.  She has been taking Bactrim since Tuesday, with doses taken on Tuesday, Wednesday, Thursday, and one dose this morning. She is due for another dose around 5:30 to 6:00 PM. She has also been taking hot showers to alleviate the throbbing and pain associated with the abscess.  In June, she had a mammogram that revealed a spot, described as similar to a pimple inside her breast. She has not seen a breast surgeon for this issue.  Additionally, she mentions having tendinitis in her right foot, which has been an ongoing issue since October. She is scheduled for an MRI of her ankle and foot next Wednesday, pending insurance approval.  No pain in the lower breast and no nipple discharge.          Relevant past medical, surgical, family, and social history reviewed and updated as indicated.  Allergies and medications reviewed and updated. Data reviewed: Chart in  Epic.   Past Medical History:  Diagnosis Date   Asthma    light asthma after pneumonia about 15 years ago   Dyslipidemia    Dysphagia    Fibrocystic breast disease    GERD (gastroesophageal reflux disease)    H/O cold sores    Hiatal hernia    Osteopenia    Psoriasis    Vitamin D  deficiency     Past Surgical History:  Procedure Laterality Date   COLONOSCOPY     TONSILLECTOMY     TUBAL LIGATION     UPPER GASTROINTESTINAL ENDOSCOPY      Social History   Socioeconomic History   Marital status: Divorced    Spouse name: Not on file   Number of children: Not on file   Years of education: Not on file   Highest education level: Some college, no degree  Occupational History   Not on file  Tobacco Use   Smoking status: Never   Smokeless tobacco: Never  Vaping Use   Vaping status: Never Used  Substance and Sexual Activity   Alcohol use: Yes    Comment: few days a week   Drug use: No   Sexual activity: Not on file  Other Topics Concern   Not on file  Social History Narrative   Lives alone.  She is divorced and has 2 children and 5 grands.     Social Drivers of Health   Tobacco Use:  Low Risk (07/16/2024)   Patient History    Smoking Tobacco Use: Never    Smokeless Tobacco Use: Never    Passive Exposure: Not on file  Financial Resource Strain: Patient Declined (06/03/2024)   Overall Financial Resource Strain (CARDIA)    Difficulty of Paying Living Expenses: Patient declined  Food Insecurity: Patient Declined (06/03/2024)   Epic    Worried About Programme Researcher, Broadcasting/film/video in the Last Year: Patient declined    Barista in the Last Year: Patient declined  Transportation Needs: Patient Declined (06/03/2024)   Epic    Lack of Transportation (Medical): Patient declined    Lack of Transportation (Non-Medical): Patient declined  Physical Activity: Unknown (06/03/2024)   Exercise Vital Sign    Days of Exercise per Week: Patient declined    Minutes of Exercise per  Session: Not on file  Stress: Patient Declined (06/03/2024)   [employer] of Occupational Health - Occupational Stress Questionnaire    Feeling of Stress: Patient declined  Social Connections: Unknown (06/03/2024)   Social Connection and Isolation Panel    Frequency of Communication with Friends and Family: More than three times a week    Frequency of Social Gatherings with Friends and Family: More than three times a week    Attends Religious Services: Patient declined    Active Member of Clubs or Organizations: Patient declined    Attends Banker Meetings: Not on file    Marital Status: Divorced  Intimate Partner Violence: Not on file  Depression (PHQ2-9): Low Risk (07/13/2024)   Depression (PHQ2-9)    PHQ-2 Score: 0  Alcohol Screen: Low Risk (05/08/2023)   Alcohol Screen    Last Alcohol Screening Score (AUDIT): 3  Housing: Low Risk (05/08/2023)   Housing    Last Housing Risk Score: 0  Utilities: Not on file  Health Literacy: Not on file    Outpatient Encounter Medications as of 07/16/2024  Medication Sig   cephALEXin  (KEFLEX ) 500 MG capsule Take 1 capsule (500 mg total) by mouth 4 (four) times daily for 7 days.   Cholecalciferol 50 MCG (2000 UT) TABS 1 tablet Orally Once a day   Dexlansoprazole  30 MG capsule DR Take 1 capsule (30 mg total) by mouth daily.   linaclotide  (LINZESS ) 145 MCG CAPS capsule Take 1 capsule (145 mcg total) by mouth daily before breakfast. TAKE 1 CAPSULE(145 MCG) BY MOUTH DAILY BEFORE BREAKFAST   meloxicam  (MOBIC ) 15 MG tablet Take 1 tablet (15 mg total) by mouth daily.   sulfamethoxazole-trimethoprim (BACTRIM DS) 800-160 MG tablet Take 1 tablet by mouth 2 (two) times daily.   Vitamin D , Ergocalciferol , (DRISDOL ) 1.25 MG (50000 UNIT) CAPS capsule TAKE 1 CAPSULE BY MOUTH EVERY 7 DAYS   alendronate  (FOSAMAX ) 70 MG tablet Take 1 tablet (70 mg total) by mouth every 7 (seven) days. Take with a full glass of water on an empty stomach. (Patient  not taking: Reported on 07/16/2024)   atorvastatin  (LIPITOR) 20 MG tablet Take 1 tablet (20 mg total) by mouth daily. (Patient not taking: Reported on 07/16/2024)   baclofen  (LIORESAL ) 10 MG tablet Take 1 tablet (10 mg total) by mouth 3 (three) times daily. (Patient not taking: Reported on 07/16/2024)   clobetasol  ointment (TEMOVATE ) 0.05 % Apply topically as needed. (Patient not taking: Reported on 07/16/2024)   DULoxetine  (CYMBALTA ) 30 MG capsule Take 1 capsule (30 mg total) by mouth daily. (Patient not taking: Reported on 07/16/2024)   fluticasone  (FLONASE ) 50 MCG/ACT nasal spray Place  2 sprays into both nostrils daily. (Patient not taking: Reported on 07/16/2024)   Fluticasone -Salmeterol (ADVAIR DISKUS) 100-50 MCG/DOSE AEPB Inhale 1 puff into the lungs in the morning and at bedtime. (Patient not taking: Reported on 07/16/2024)   [DISCONTINUED] metroNIDAZOLE  (METROCREAM ) 0.75 % cream Apply topically daily. (Patient not taking: Reported on 07/13/2024)   [EXPIRED] cefTRIAXone (ROCEPHIN) injection 1 g    No facility-administered encounter medications on file as of 07/16/2024.    Allergies[1]  Pertinent ROS per HPI, otherwise unremarkable      Objective:  BP 122/79   Pulse 92   Temp (!) 97 F (36.1 C)   Ht 5' 2 (1.575 m)   Wt 230 lb 6.4 oz (104.5 kg)   SpO2 99%   BMI 42.14 kg/m    Wt Readings from Last 3 Encounters:  07/16/24 230 lb 6.4 oz (104.5 kg)  07/13/24 229 lb (103.9 kg)  06/30/24 232 lb (105.2 kg)    Physical Exam Vitals and nursing note reviewed.  Constitutional:      General: She is not in acute distress.    Appearance: Normal appearance. She is obese. She is not ill-appearing, toxic-appearing or diaphoretic.  HENT:     Head: Normocephalic and atraumatic.     Mouth/Throat:     Mouth: Mucous membranes are moist.  Eyes:     Pupils: Pupils are equal, round, and reactive to light.  Cardiovascular:     Rate and Rhythm: Normal rate and regular rhythm.     Heart sounds: Normal  heart sounds.  Pulmonary:     Effort: Pulmonary effort is normal.     Breath sounds: Normal breath sounds.  Musculoskeletal:     Cervical back: Neck supple.  Skin:    Capillary Refill: Capillary refill takes less than 2 seconds.     Findings: Abscess and erythema present.      Neurological:     General: No focal deficit present.     Mental Status: She is alert and oriented to person, place, and time.  Psychiatric:        Mood and Affect: Mood normal.        Behavior: Behavior normal.        Thought Content: Thought content normal.        Judgment: Judgment normal.     Right BREAST: Induration in the upper breast with a hard, non-fluctuant abscess.        Results for orders placed or performed in visit on 07/13/24  Anaerobic and Aerobic Culture   Collection Time: 07/13/24  9:14 AM   Specimen: Breast   BR  Result Value Ref Range   Anaerobic Culture WILL FOLLOW    Aerobic Culture Final report    Result 1 Mixed skin flora        Pertinent labs & imaging results that were available during my care of the patient were reviewed by me and considered in my medical decision making.  Assessment & Plan:  Hanae was seen today for abcess on right breast.  Diagnoses and all orders for this visit:  Breast abscess -     Ambulatory referral to General Surgery -     cephALEXin  (KEFLEX ) 500 MG capsule; Take 1 capsule (500 mg total) by mouth 4 (four) times daily for 7 days. -     cefTRIAXone (ROCEPHIN) injection 1 g  Cellulitis of female breast -     Ambulatory referral to General Surgery -     cephALEXin  (KEFLEX ) 500 MG capsule;  Take 1 capsule (500 mg total) by mouth 4 (four) times daily for 7 days. -     cefTRIAXone (ROCEPHIN) injection 1 g      Breast abscess Localized to the upper breast with induration extending upwards. No fluctuation, indicating it is not ready for drainage. Reports chills but no fever. Differential includes mastitis, but absence of lower breast pain or  nipple discharge makes this less likely. Previous mammogram showed a spot described as a pimple-like lesion, not excised. Current treatment with Bactrim has not resolved the abscess. - Administered Rocephin injection today. - Continue Bactrim as prescribed. - Prescribed Keflex  for 7 days, 4 times a day. - Referred to Dr. Ebbie, a breast surgeon, for potential drainage if abscess does not resolve. - Marked the area of redness; if it spreads beyond the marked area, advised hospital visit.          Continue all other maintenance medications.  Follow up plan: Return if symptoms worsen or fail to improve.   Continue healthy lifestyle choices, including diet (rich in fruits, vegetables, and lean proteins, and low in salt and simple carbohydrates) and exercise (at least 30 minutes of moderate physical activity daily).  Educational handout given for cellulitis   The above assessment and management plan was discussed with the patient. The patient verbalized understanding of and has agreed to the management plan. Patient is aware to call the clinic if they develop any new symptoms or if symptoms persist or worsen. Patient is aware when to return to the clinic for a follow-up visit. Patient educated on when it is appropriate to go to the emergency department.   Rosaline Bruns, FNP-C [facility] [phone]     [1]  Allergies Allergen Reactions   Amoxicillin  Other (See Comments)   "

## 2024-07-17 LAB — ANAEROBIC AND AEROBIC CULTURE

## 2024-07-19 ENCOUNTER — Telehealth: Payer: Self-pay | Admitting: Family

## 2024-07-19 ENCOUNTER — Encounter: Payer: Self-pay | Admitting: Family Medicine

## 2024-07-19 ENCOUNTER — Ambulatory Visit: Payer: Self-pay | Admitting: Nurse Practitioner

## 2024-07-19 NOTE — Telephone Encounter (Signed)
 Ankle has already been approved foot is not as insurance would only approve one or the other

## 2024-07-19 NOTE — Telephone Encounter (Unsigned)
 Copied from CRM [id number]. Topic: Referral - Question >> Jul 19, 2024 11:18 AM Debby BROCKS wrote: Reason for CRM: Patients daughter called in Fredrick E. (Daughter): [phone]) Stating that their referral for surgery hasn't gone through. Referrals show 07/20/2023 @ 10:38 ready for scheduling but when she called [facility] they stated they have not received any referrals for the patient

## 2024-07-20 NOTE — Telephone Encounter (Signed)
 I called [facility] and verified the office received the referral.

## 2024-07-21 ENCOUNTER — Ambulatory Visit
Admission: RE | Admit: 2024-07-21 | Discharge: 2024-07-21 | Disposition: A | Discharge status: Home / Self Care | Source: Ambulatory Visit | Attending: Family Medicine | Admitting: Family Medicine

## 2024-07-21 ENCOUNTER — Other Ambulatory Visit

## 2024-07-21 DIAGNOSIS — G8929 Other chronic pain: Secondary | ICD-10-CM

## 2024-07-27 ENCOUNTER — Ambulatory Visit: Payer: Self-pay | Admitting: Family Medicine

## 2024-07-30 ENCOUNTER — Other Ambulatory Visit

## 2024-07-30 DIAGNOSIS — M818 Other osteoporosis without current pathological fracture: Secondary | ICD-10-CM

## 2024-07-30 NOTE — Progress Notes (Cosign Needed)
 "  07/30/2024 Name: Angela Curry MRN: [id number] DOB: Jun 03, 1951  Chief Complaint  Patient presents with   Osteoporosis    Angela Curry is a 74 y.o. year old female who presented for a telephone visit.   They were referred to the pharmacist by their PCP for assistance in managing osteoporosis .    Subjective:  Care Team: Primary Care Provider: Lavell Bari LABOR, FNP    Medication Access/Adherence  Current Pharmacy:  [facility] [phone] - [place], [place] - [phone] [place] [address] Phone: [phone] Fax: [phone]   Patient reports affordability concerns with their medications: No  Patient reports access/transportation concerns to their pharmacy: No  Patient reports adherence concerns with their medications:  No     Osteoporosis:  Current medications:  Medications tried in the past:   Current supplements: starting vitamin D  and calcium   Current physical activity: encouraged weight bearing exercise  Most recent DEXA: 06/07/24 COMPARISON:  12/02/2017.   FINDINGS: Scan quality: Good.   LUMBAR SPINE (L1-L4):  BMD (in g/cm2): 0.887  T-score: -2.5  Z-score: -2.0   Rate of change from previous exam: -8.0 %  LEFT FEMORAL NECK:  BMD (in g/cm2): 0.803  T-score: -1.7  Z-score: -0.7   LEFT TOTAL HIP:  BMD (in g/cm2): 0.921  T-score: -0.7  Z-score: 0.0   RIGHT FEMORAL NECK:  BMD (in g/cm2): 0.809  T-score: -1.6  Z-score: -0.6   RIGHT TOTAL HIP:  BMD (in g/cm2): 0.865  T-score: -1.1  Z-score: -0.4   DUAL-FEMUR TOTAL MEAN:   Rate of change from previous exam: No significant rate of change from previous exam.   RIGHT FOREARM (RADIUS 33%):  BMD (in g/cm2): 0.678  T-score: -2.4  Z-score: -0.4   Objective:  Lab Results  Component Value Date   HGBA1C 5.9 04/25/2016    Lab Results  Component Value Date   CREATININE 0.64 06/07/2024   BUN 12 06/07/2024    NA 141 06/07/2024   K 4.4 06/07/2024   CL 102 06/07/2024   CO2 23 06/07/2024    Lab Results  Component Value Date   CHOL 233 (H) 12/04/2023   HDL 54 12/04/2023   LDLCALC 153 (H) 12/04/2023   TRIG 147 12/04/2023   CHOLHDL 4.3 12/04/2023    Medications Reviewed Today     Reviewed by Billee Mliss JONETTA, RPH-CPP (Pharmacist) on 08/20/24 at 1150  Med List Status: <None>   Medication Order Taking? Sig Documenting Provider Last Dose Status Informant  alendronate  (FOSAMAX ) 70 MG tablet [id number]  Take 1 tablet (70 mg total) by mouth every 7 (seven) days. Take with a full glass of water on an empty stomach. Lavell Bari A, FNP  Active   atorvastatin  (LIPITOR) 20 MG tablet [id number]  Take 1 tablet (20 mg total) by mouth daily. Lavell Bari A, FNP  Active   baclofen  (LIORESAL ) 10 MG tablet [id number]  Take 1 tablet (10 mg total) by mouth 3 (three) times daily. Lavell Bari A, FNP  Active   Cholecalciferol 50 MCG (2000 UT) TABS [id number]  1 tablet Orally Once a day [provider]  Active   clobetasol  ointment (TEMOVATE ) 0.05 % [id number]  Apply topically as needed. Lavell Bari A, FNP  Active   Dexlansoprazole  30 MG capsule DR [id number]  Take 1 capsule (30 mg total) by mouth daily. Lavell Bari A, FNP  Active   doxycycline  (VIBRA -TABS) 100  MG tablet [id number]  Take 1 tablet (100 mg total) by mouth 2 (two) times daily for 7 days. Claudene Arthea HERO, DO  Active    Patient not taking:   Discontinued 08/20/24 1148 (Patient Preference)   fluticasone  (FLONASE ) 50 MCG/ACT nasal spray [id number]  Place 2 sprays into both nostrils daily.  Patient not taking: Reported on 07/16/2024   Lavell Lye A, FNP  Active   Fluticasone -Salmeterol (ADVAIR DISKUS) 100-50 MCG/DOSE AEPB [id number]  Inhale 1 puff into the lungs in the morning and at bedtime. Lavell Lye A, FNP  Expired 08/19/24 2359   linaclotide  (LINZESS ) 145 MCG CAPS capsule [id number]  Take 1 capsule (145 mcg total) by mouth daily  before breakfast. TAKE 1 CAPSULE(145 MCG) BY MOUTH DAILY BEFORE BREAKFAST Hawks, Christy A, FNP  Active   meloxicam  (MOBIC ) 15 MG tablet [id number]  Take 1 tablet (15 mg total) by mouth daily. Lavell Lye A, FNP  Active   metroNIDAZOLE  (FLAGYL ) 500 MG tablet [id number]  Take 1 tablet (500 mg total) by mouth 2 (two) times daily for 7 days. Claudene Arthea HERO, DO  Active     Discontinued 08/20/24 1149 (Completed Course)   Vitamin D , Ergocalciferol , (DRISDOL ) 1.25 MG (50000 UNIT) CAPS capsule [id number]  TAKE 1 CAPSULE BY MOUTH EVERY 7 DAYS Hawks, Christy A, FNP  Active               Assessment/Plan:   Osteoporosis:  - Reviewed recommendation for daily calcium  intake of 1200 mg and vitamin D  intake of [phone] units - Recommended to choose calcium  citrate formulation due to concurrent acid reflux medication - Reviewed benefits of weight bearing exercise - Recommend to continue alendronate  weekly and follow up DEXA in 2 yrs.    Follow Up Plan: DEXA in 2 yrs  Mliss Tarry Griffin, PharmD, BCACP, CPP Clinical Pharmacist, [facility]   "

## 2024-08-11 ENCOUNTER — Other Ambulatory Visit: Payer: Self-pay

## 2024-08-11 DIAGNOSIS — M25571 Pain in right ankle and joints of right foot: Secondary | ICD-10-CM

## 2024-08-12 ENCOUNTER — Ambulatory Visit: Payer: Self-pay | Admitting: Family Medicine

## 2024-08-12 ENCOUNTER — Ambulatory Visit (HOSPITAL_COMMUNITY)
Admission: RE | Admit: 2024-08-12 | Discharge: 2024-08-12 | Disposition: A | Discharge status: Home / Self Care | Source: Ambulatory Visit | Attending: Family Medicine | Admitting: Family Medicine

## 2024-08-12 DIAGNOSIS — M25571 Pain in right ankle and joints of right foot: Secondary | ICD-10-CM | POA: Insufficient documentation

## 2024-08-18 NOTE — Progress Notes (Unsigned)
 " Angela [facility] [address] [place] [address] Phone: [phone] Subjective:   Angela Curry, am serving as a scribe for Dr. Arthea Curry.  I'm seeing this patient by the request  of:  Angela Curry LABOR, FNP  CC: Foot pain and tendon follow-up  YEP:Dlagzrupcz  Angela Curry is a 74 y.o. female coming in with complaint of R foot pain. Patient states that she continues to have pain that seems to be worsening. Did mention landing on both feet from a 6 inch height out of a truck a few months ago that might have exacerbated her pain. Has been out of work for 3 weeks. Patient said that moving foot in air is more painful than when she is bearing weight. Can have pain in metatarsals. Has not been walking barefooted in her house.    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    Past Medical History:  Diagnosis Date   Asthma    light asthma after pneumonia about 15 years ago   Dyslipidemia    Dysphagia    Fibrocystic breast disease    GERD (gastroesophageal reflux disease)    H/O cold sores    Hiatal hernia    Osteopenia    Psoriasis    Vitamin D  deficiency    Past Surgical History:  Procedure Laterality Date   COLONOSCOPY     TONSILLECTOMY     TUBAL LIGATION     UPPER GASTROINTESTINAL ENDOSCOPY     Social History   Socioeconomic History   Marital status: Divorced    Spouse name: Not on file   Number of children: Not on file   Years of education: Not on file   Highest education level: Some college, no degree  Occupational History   Not on file  Tobacco Use   Smoking status: Never   Smokeless tobacco: Never  Vaping Use   Vaping status: Never Used  Substance and Sexual Activity   Alcohol use: Yes    Comment: few days a week   Drug use: No   Sexual activity: Not on file   Other Topics Concern   Not on file  Social History Narrative   Lives alone.  She is divorced and has 2 children and 5 grands.     Social Drivers of Health   Tobacco Use: Low Risk  (08/13/2024)   Received from [facility]   Patient History    Smoking Tobacco Use: Never    Smokeless Tobacco Use: Never    Passive Exposure: Not on file  Financial Resource Strain: Patient Declined (06/03/2024)   Overall Financial Resource Strain (CARDIA)    Difficulty of Paying Living Expenses: Patient declined  Food Insecurity: Patient Declined (06/03/2024)   Epic    Worried About Programme Researcher, Broadcasting/film/video in the Last Year: Patient declined    Barista in the Last Year: Patient declined  Transportation Needs: Patient Declined (06/03/2024)   Epic    Lack of Transportation (Medical): Patient declined    Lack of Transportation (Non-Medical): Patient declined  Physical Activity: Unknown (06/03/2024)   Exercise Vital Sign    Days of Exercise per Week: Patient declined    Minutes of Exercise per Session: Not on file  Stress: Patient Declined (06/03/2024)   [employer] of Occupational Health - Occupational  Stress Questionnaire    Feeling of Stress: Patient declined  Social Connections: Unknown (06/03/2024)   Social Connection and Isolation Panel    Frequency of Communication with Friends and Family: More than three times a week    Frequency of Social Gatherings with Friends and Family: More than three times a week    Attends Religious Services: Patient declined    Active Member of Clubs or Organizations: Patient declined    Attends Banker Meetings: Not on file    Marital Status: Divorced  Depression (PHQ2-9): Low Risk (07/13/2024)   Depression (PHQ2-9)    PHQ-2 Score: 0  Alcohol Screen: Low Risk (05/08/2023)   Alcohol Screen    Last Alcohol Screening Score (AUDIT): 3  Housing: Unknown (07/20/2024)   Received from [facility]   Epic    Unable  to Pay for Housing in the Last Year: Not on file    Number of Times Moved in the Last Year: Not on file    At any time in the past 12 months, were you homeless or living in a shelter (including now)?: No  Utilities: Not on file  Health Literacy: Not on file   Allergies[1] Family History  Problem Relation Age of Onset   Diabetes Mother    Heart disease Father 55       Heart attack   Drug abuse Father    Early death Father    Cancer Paternal Aunt        breast   Breast cancer Paternal Aunt    Cancer Paternal Aunt        breast   Breast cancer Paternal Aunt    Esophageal cancer Neg Hx    Colon cancer Neg Hx    Stomach cancer Neg Hx    Rectal cancer Neg Hx     Current Outpatient Medications (Endocrine & Metabolic):    alendronate  (FOSAMAX ) 70 MG tablet, Take 1 tablet (70 mg total) by mouth every 7 (seven) days. Take with a full glass of water on an empty stomach.  Current Outpatient Medications (Cardiovascular):    atorvastatin  (LIPITOR) 20 MG tablet, Take 1 tablet (20 mg total) by mouth daily.  Current Outpatient Medications (Respiratory):    Fluticasone -Salmeterol (ADVAIR DISKUS) 100-50 MCG/DOSE AEPB, Inhale 1 puff into the lungs in the morning and at bedtime.   fluticasone  (FLONASE ) 50 MCG/ACT nasal spray, Place 2 sprays into both nostrils daily. (Patient not taking: Reported on 07/16/2024)  Current Outpatient Medications (Analgesics):    meloxicam  (MOBIC ) 15 MG tablet, Take 1 tablet (15 mg total) by mouth daily.  Current Outpatient Medications (Other):    baclofen  (LIORESAL ) 10 MG tablet, Take 1 tablet (10 mg total) by mouth 3 (three) times daily.   Cholecalciferol 50 MCG (2000 UT) TABS, 1 tablet Orally Once a day   clobetasol  ointment (TEMOVATE ) 0.05 %, Apply topically as needed.   Dexlansoprazole  30 MG capsule DR, Take 1 capsule (30 mg total) by mouth daily.   linaclotide  (LINZESS ) 145 MCG CAPS capsule, Take 1 capsule (145 mcg total) by mouth daily before breakfast. TAKE 1  CAPSULE(145 MCG) BY MOUTH DAILY BEFORE BREAKFAST   Vitamin D , Ergocalciferol , (DRISDOL ) 1.25 MG (50000 UNIT) CAPS capsule, TAKE 1 CAPSULE BY MOUTH EVERY 7 DAYS   DULoxetine  (CYMBALTA ) 30 MG capsule, Take 1 capsule (30 mg total) by mouth daily. (Patient not taking: Reported on 07/16/2024)   sulfamethoxazole -trimethoprim  (BACTRIM  DS) 800-160 MG tablet, Take 1 tablet by mouth 2 (two) times daily.  Reviewed prior external information including notes and imaging from  primary care provider As well as notes that were available from care everywhere and other healthcare systems.  Past medical history, social, surgical and family history all reviewed in electronic medical record.  No pertanent information unless stated regarding to the chief complaint.   Review of Systems:  No headache, visual changes, nausea, vomiting, diarrhea, constipation, dizziness, abdominal pain, skin rash, fevers, chills, night sweats, weight loss, swollen lymph nodes, body aches, chest pain, shortness of breath, mood changes. POSITIVE muscle aches, joint swelling  Objective  Blood pressure (!) 142/92, pulse 96, height 5' 2 (1.575 m), weight 230 lb (104.3 kg), SpO2 97%.   General: No apparent distress alert and oriented x3 mood and affect normal, dressed appropriately.  HEENT: Pupils equal, extraocular movements intact  Respiratory: Patient's speak in full sentences and does not appear short of breath  Cardiovascular: No lower extremity edema, non tender, no erythema   Ankle exam shows severely antalgic walking noted.  Patient does have some tenderness to palpation mostly over the posterior tibialis tendon.  No pain over the bone itself just inferior and posterior to the medial malleolus area.  Does still have some soft tissues swelling noted in the surrounding area.  Achilles is intact.  Limited muscular skeletal ultrasound was performed and interpreted by Angela Curry, M  Limited ultrasound shows that there is scar  tissue formation in the area where there was tearing previously noted on MRI.  Still some increasing in Doppler flow in the surrounding area and neovascularization that does show some healing aspect.   Impression and Recommendations:    The above documentation has been reviewed and is accurate and complete Angela Curry M Angela Septer, DO        [1]  Allergies Allergen Reactions   Amoxicillin  Other (See Comments)   "

## 2024-08-18 NOTE — Progress Notes (Unsigned)
 "               [facility] [address] [place] [address] Phone: [phone]   Assessment and Plan:     ***    Pertinent previous records reviewed include ***   Follow Up: ***     Subjective:   I, Angela Curry, am serving as a neurosurgeon for Doctor [employer]  Chief Complaint: foot pain   HPI:  05/12/2024 Significant overpronation noted, discussed with patient's history of the navicular bone we do need to be a little bit more cautious.  Discussed over-the-counter orthotics, recovery sandals, home exercises given.  I do not see any true cortical irregularity other than some underlying arthritis of the midfoot but that is not consistent with patient's symptoms discussed icing regimen and topical anti-inflammatories.  Held on any prescription medications at this point though.  Worsening pain consider injection and formal physical therapy.  Follow-up again in 6 to 8 weeks otherwise.  Update 06/30/2024 Angela Curry is a 74 y.o. female coming in with complaint of R foot pain. Patient states L side sciatic nerve is irritated as well. Feet feel like they are better, but still pain in the heel. Today not the best day. Can hurt at rest. Depends on the position.  08/19/2024 Patient states   Relevant Historical Information: ***  Additional pertinent review of systems negative.  Current Medications[1]   Objective:     There were no vitals filed for this visit.    There is no height or weight on file to calculate BMI.    Physical Exam:    ***   Electronically signed by:  [facility] 7:33 AM 08/18/24    [1]  Current Outpatient Medications:    alendronate  (FOSAMAX ) 70 MG tablet, Take 1 tablet (70 mg total) by mouth every 7 (seven) days. Take with a full glass of water on an empty stomach. (Patient not taking: Reported on 07/16/2024), Disp: 12 tablet, Rfl: 2   atorvastatin  (LIPITOR) 20  MG tablet, Take 1 tablet (20 mg total) by mouth daily. (Patient not taking: Reported on 07/16/2024), Disp: 90 tablet, Rfl: 3   baclofen  (LIORESAL ) 10 MG tablet, Take 1 tablet (10 mg total) by mouth 3 (three) times daily. (Patient not taking: Reported on 07/16/2024), Disp: 60 each, Rfl: 2   Cholecalciferol 50 MCG (2000 UT) TABS, 1 tablet Orally Once a day, Disp: , Rfl:    clobetasol  ointment (TEMOVATE ) 0.05 %, Apply topically as needed. (Patient not taking: Reported on 07/16/2024), Disp: 60 g, Rfl: 2   Dexlansoprazole  30 MG capsule DR, Take 1 capsule (30 mg total) by mouth daily., Disp: 90 capsule, Rfl: 3   DULoxetine  (CYMBALTA ) 30 MG capsule, Take 1 capsule (30 mg total) by mouth daily. (Patient not taking: Reported on 07/16/2024), Disp: 90 capsule, Rfl: 1   fluticasone  (FLONASE ) 50 MCG/ACT nasal spray, Place 2 sprays into both nostrils daily. (Patient not taking: Reported on 07/16/2024), Disp: 16 g, Rfl: 6   Fluticasone -Salmeterol (ADVAIR DISKUS) 100-50 MCG/DOSE AEPB, Inhale 1 puff into the lungs in the morning and at bedtime. (Patient not taking: Reported on 07/16/2024), Disp: 60 each, Rfl: 2   linaclotide  (LINZESS ) 145 MCG CAPS capsule, Take 1 capsule (145 mcg total) by mouth daily before breakfast. TAKE 1 CAPSULE(145 MCG) BY MOUTH DAILY BEFORE BREAKFAST, Disp: 90 capsule, Rfl: 3   meloxicam  (MOBIC ) 15 MG tablet, Take 1 tablet (15 mg total)  by mouth daily., Disp: 90 tablet, Rfl: 3   sulfamethoxazole -trimethoprim  (BACTRIM  DS) 800-160 MG tablet, Take 1 tablet by mouth 2 (two) times daily., Disp: 20 tablet, Rfl: 0   Vitamin D , Ergocalciferol , (DRISDOL ) 1.25 MG (50000 UNIT) CAPS capsule, TAKE 1 CAPSULE BY MOUTH EVERY 7 DAYS, Disp: 12 capsule, Rfl: 2  "

## 2024-08-19 ENCOUNTER — Ambulatory Visit: Admitting: Sports Medicine

## 2024-08-19 ENCOUNTER — Ambulatory Visit: Admitting: Family Medicine

## 2024-08-19 ENCOUNTER — Other Ambulatory Visit: Payer: Self-pay

## 2024-08-19 VITALS — BP 142/92 | HR 96 | Ht 62.0 in | Wt 230.0 lb

## 2024-08-19 DIAGNOSIS — M25571 Pain in right ankle and joints of right foot: Secondary | ICD-10-CM

## 2024-08-19 DIAGNOSIS — G8929 Other chronic pain: Secondary | ICD-10-CM | POA: Diagnosis not present

## 2024-08-19 DIAGNOSIS — M76821 Posterior tibial tendinitis, right leg: Secondary | ICD-10-CM | POA: Diagnosis not present

## 2024-08-19 MED ORDER — METRONIDAZOLE 500 MG PO TABS: 500.0000 mg | ORAL_TABLET | Freq: Two times a day (BID) | ORAL | 0 refills | Status: AC

## 2024-08-19 MED ORDER — DOXYCYCLINE HYCLATE 100 MG PO TABS: 100.0000 mg | ORAL_TABLET | Freq: Two times a day (BID) | ORAL | 0 refills | Status: AC

## 2024-08-19 NOTE — Patient Instructions (Signed)
 See me in 4 weeks Ok to double book

## 2024-08-19 NOTE — Assessment & Plan Note (Signed)
 Partial tearing noted.  Some hypoechoic changes but patient does have increasing Doppler flow and improving.  Follow-up with me again in 4 weeks.  Patient given notes for work to keep her a seated work only for now and then we will advance accordingly at follow-up

## 2024-08-25 ENCOUNTER — Ambulatory Visit: Admitting: Family Medicine

## 2024-08-31 ENCOUNTER — Ambulatory Visit: Admitting: Family Medicine

## 2024-09-16 ENCOUNTER — Ambulatory Visit: Admitting: Family Medicine

## 2025-07-14 ENCOUNTER — Other Ambulatory Visit (HOSPITAL_COMMUNITY)
